# Patient Record
Sex: Female | Born: 1970
Health system: Southern US, Community
[De-identification: ages and names within clinical notes are randomized; demographics above are authoritative.]

## PROBLEM LIST (undated history)

## (undated) DIAGNOSIS — I10 Essential (primary) hypertension: Secondary | ICD-10-CM

## (undated) DIAGNOSIS — R87629 Unspecified abnormal cytological findings in specimens from vagina: Secondary | ICD-10-CM

## (undated) DIAGNOSIS — F32A Depression, unspecified: Secondary | ICD-10-CM

## (undated) DIAGNOSIS — M545 Low back pain, unspecified: Secondary | ICD-10-CM

## (undated) DIAGNOSIS — K760 Fatty (change of) liver, not elsewhere classified: Secondary | ICD-10-CM

## (undated) DIAGNOSIS — M1990 Unspecified osteoarthritis, unspecified site: Secondary | ICD-10-CM

## (undated) DIAGNOSIS — G4730 Sleep apnea, unspecified: Secondary | ICD-10-CM

## (undated) DIAGNOSIS — T7840XA Allergy, unspecified, initial encounter: Secondary | ICD-10-CM

## (undated) DIAGNOSIS — G4733 Obstructive sleep apnea (adult) (pediatric): Secondary | ICD-10-CM

## (undated) DIAGNOSIS — K219 Gastro-esophageal reflux disease without esophagitis: Secondary | ICD-10-CM

## (undated) DIAGNOSIS — R8789 Other abnormal findings in specimens from female genital organs: Secondary | ICD-10-CM

## (undated) DIAGNOSIS — R87810 Cervical high risk human papillomavirus (HPV) DNA test positive: Secondary | ICD-10-CM

## (undated) DIAGNOSIS — E049 Nontoxic goiter, unspecified: Secondary | ICD-10-CM

## (undated) DIAGNOSIS — E669 Obesity, unspecified: Secondary | ICD-10-CM

## (undated) DIAGNOSIS — F419 Anxiety disorder, unspecified: Secondary | ICD-10-CM

## (undated) DIAGNOSIS — F329 Major depressive disorder, single episode, unspecified: Secondary | ICD-10-CM

## (undated) HISTORY — DX: Depression, unspecified: F32.A

## (undated) HISTORY — PX: ENDOMETRIAL ABLATION: SHX621

## (undated) HISTORY — DX: Nontoxic goiter, unspecified: E04.9

## (undated) HISTORY — DX: Major depressive disorder, single episode, unspecified: F32.9

## (undated) HISTORY — DX: Essential (primary) hypertension: I10

## (undated) HISTORY — DX: Obesity, unspecified: E66.9

## (undated) HISTORY — DX: Sleep apnea, unspecified: G47.30

## (undated) HISTORY — DX: Allergy, unspecified, initial encounter: T78.40XA

## (undated) HISTORY — DX: Obstructive sleep apnea (adult) (pediatric): G47.33

## (undated) HISTORY — DX: Low back pain, unspecified: M54.50

## (undated) HISTORY — PX: TUBAL LIGATION: SHX77

## (undated) HISTORY — DX: Anxiety disorder, unspecified: F41.9

## (undated) HISTORY — DX: Unspecified abnormal cytological findings in specimens from vagina: R87.629

## (undated) HISTORY — PX: REDUCTION MAMMAPLASTY: SUR839

## (undated) HISTORY — DX: Cervical high risk human papillomavirus (HPV) DNA test positive: R87.810

## (undated) HISTORY — DX: Other abnormal findings in specimens from female genital organs: R87.89

## (undated) HISTORY — PX: ARTHROSCOPY KNEE W/ DRILLING: SUR92

## (undated) HISTORY — DX: Low back pain: M54.5

---
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
## 1998-02-11 ENCOUNTER — Encounter: Admission: RE | Admit: 1998-02-11 | Discharge: 1998-05-12 | Payer: Self-pay | Admitting: Family Medicine

## 1999-05-11 ENCOUNTER — Other Ambulatory Visit: Admission: RE | Admit: 1999-05-11 | Discharge: 1999-05-11 | Payer: Self-pay | Admitting: *Deleted

## 1999-07-16 ENCOUNTER — Encounter: Payer: Self-pay | Admitting: Family Medicine

## 1999-07-16 ENCOUNTER — Emergency Department (HOSPITAL_COMMUNITY): Admission: EM | Admit: 1999-07-16 | Discharge: 1999-07-16 | Payer: Self-pay | Admitting: Emergency Medicine

## 1999-07-16 ENCOUNTER — Ambulatory Visit (HOSPITAL_COMMUNITY): Admission: RE | Admit: 1999-07-16 | Discharge: 1999-07-16 | Payer: Self-pay | Admitting: Family Medicine

## 1999-07-20 ENCOUNTER — Ambulatory Visit (HOSPITAL_COMMUNITY): Admission: RE | Admit: 1999-07-20 | Discharge: 1999-07-20 | Payer: Self-pay | Admitting: *Deleted

## 2000-07-10 ENCOUNTER — Other Ambulatory Visit: Admission: RE | Admit: 2000-07-10 | Discharge: 2000-07-10 | Payer: Self-pay | Admitting: *Deleted

## 2001-07-17 ENCOUNTER — Other Ambulatory Visit: Admission: RE | Admit: 2001-07-17 | Discharge: 2001-07-17 | Payer: Self-pay | Admitting: *Deleted

## 2001-09-26 ENCOUNTER — Ambulatory Visit (HOSPITAL_COMMUNITY): Admission: RE | Admit: 2001-09-26 | Discharge: 2001-09-26 | Payer: Self-pay | Admitting: Internal Medicine

## 2001-09-26 ENCOUNTER — Encounter (INDEPENDENT_AMBULATORY_CARE_PROVIDER_SITE_OTHER): Payer: Self-pay | Admitting: Internal Medicine

## 2001-10-04 ENCOUNTER — Ambulatory Visit (HOSPITAL_COMMUNITY): Admission: RE | Admit: 2001-10-04 | Discharge: 2001-10-04 | Payer: Self-pay | Admitting: Internal Medicine

## 2001-12-27 ENCOUNTER — Ambulatory Visit (HOSPITAL_COMMUNITY): Admission: RE | Admit: 2001-12-27 | Discharge: 2001-12-27 | Payer: Self-pay | Admitting: Internal Medicine

## 2001-12-27 ENCOUNTER — Encounter (INDEPENDENT_AMBULATORY_CARE_PROVIDER_SITE_OTHER): Payer: Self-pay | Admitting: Internal Medicine

## 2002-09-11 ENCOUNTER — Other Ambulatory Visit: Admission: RE | Admit: 2002-09-11 | Discharge: 2002-09-11 | Payer: Self-pay | Admitting: *Deleted

## 2002-12-27 ENCOUNTER — Emergency Department (HOSPITAL_COMMUNITY): Admission: EM | Admit: 2002-12-27 | Discharge: 2002-12-27 | Payer: Self-pay | Admitting: Internal Medicine

## 2003-11-17 ENCOUNTER — Other Ambulatory Visit: Admission: RE | Admit: 2003-11-17 | Discharge: 2003-11-17 | Payer: Self-pay | Admitting: *Deleted

## 2004-05-27 ENCOUNTER — Emergency Department (HOSPITAL_COMMUNITY): Admission: EM | Admit: 2004-05-27 | Discharge: 2004-05-27 | Payer: Self-pay | Admitting: Family Medicine

## 2004-12-26 ENCOUNTER — Ambulatory Visit: Payer: Self-pay | Admitting: Internal Medicine

## 2005-02-17 ENCOUNTER — Ambulatory Visit: Payer: Self-pay | Admitting: Internal Medicine

## 2005-04-17 ENCOUNTER — Ambulatory Visit: Payer: Self-pay | Admitting: Internal Medicine

## 2005-07-07 ENCOUNTER — Ambulatory Visit: Payer: Self-pay | Admitting: Internal Medicine

## 2005-09-01 ENCOUNTER — Ambulatory Visit: Payer: Self-pay | Admitting: Internal Medicine

## 2005-11-15 ENCOUNTER — Emergency Department (HOSPITAL_COMMUNITY): Admission: EM | Admit: 2005-11-15 | Discharge: 2005-11-15 | Payer: Self-pay | Admitting: Emergency Medicine

## 2005-12-12 ENCOUNTER — Ambulatory Visit (HOSPITAL_COMMUNITY): Admission: RE | Admit: 2005-12-12 | Discharge: 2005-12-12 | Payer: Self-pay | Admitting: Internal Medicine

## 2005-12-12 ENCOUNTER — Ambulatory Visit: Payer: Self-pay | Admitting: Internal Medicine

## 2005-12-28 ENCOUNTER — Encounter: Payer: Self-pay | Admitting: Pulmonary Disease

## 2005-12-28 ENCOUNTER — Ambulatory Visit (HOSPITAL_BASED_OUTPATIENT_CLINIC_OR_DEPARTMENT_OTHER): Admission: RE | Admit: 2005-12-28 | Discharge: 2005-12-28 | Payer: Self-pay | Admitting: Internal Medicine

## 2006-01-03 ENCOUNTER — Ambulatory Visit: Payer: Self-pay | Admitting: Pulmonary Disease

## 2006-02-05 ENCOUNTER — Ambulatory Visit: Payer: Self-pay | Admitting: Pulmonary Disease

## 2007-01-09 ENCOUNTER — Ambulatory Visit: Payer: Self-pay | Admitting: Internal Medicine

## 2007-01-15 ENCOUNTER — Other Ambulatory Visit: Admission: RE | Admit: 2007-01-15 | Discharge: 2007-01-15 | Payer: Self-pay | Admitting: *Deleted

## 2007-03-12 ENCOUNTER — Ambulatory Visit: Payer: Self-pay | Admitting: Internal Medicine

## 2007-09-12 ENCOUNTER — Encounter: Payer: Self-pay | Admitting: Internal Medicine

## 2007-09-12 ENCOUNTER — Ambulatory Visit: Payer: Self-pay | Admitting: Internal Medicine

## 2007-09-12 DIAGNOSIS — I10 Essential (primary) hypertension: Secondary | ICD-10-CM | POA: Insufficient documentation

## 2007-09-12 DIAGNOSIS — J45909 Unspecified asthma, uncomplicated: Secondary | ICD-10-CM | POA: Insufficient documentation

## 2007-09-12 DIAGNOSIS — Z6841 Body Mass Index (BMI) 40.0 and over, adult: Secondary | ICD-10-CM

## 2007-09-12 DIAGNOSIS — L719 Rosacea, unspecified: Secondary | ICD-10-CM | POA: Insufficient documentation

## 2007-09-12 DIAGNOSIS — M545 Low back pain, unspecified: Secondary | ICD-10-CM | POA: Insufficient documentation

## 2007-09-12 DIAGNOSIS — F4321 Adjustment disorder with depressed mood: Secondary | ICD-10-CM | POA: Insufficient documentation

## 2007-09-12 LAB — CONVERTED CEMR LAB
ALT: 23 units/L (ref 0–35)
AST: 22 units/L (ref 0–37)
Albumin: 3.9 g/dL (ref 3.5–5.2)
Alkaline Phosphatase: 59 units/L (ref 39–117)
BUN: 11 mg/dL (ref 6–23)
Basophils Absolute: 0.3 10*3/uL — ABNORMAL HIGH (ref 0.0–0.1)
Basophils Relative: 5 % — ABNORMAL HIGH (ref 0.0–1.0)
Bilirubin, Direct: 0.1 mg/dL (ref 0.0–0.3)
CO2: 24 meq/L (ref 19–32)
Calcium: 9.3 mg/dL (ref 8.4–10.5)
Chloride: 107 meq/L (ref 96–112)
Cholesterol: 215 mg/dL (ref 0–200)
Creatinine, Ser: 0.7 mg/dL (ref 0.4–1.2)
Direct LDL: 145.5 mg/dL
Eosinophils Absolute: 0.1 10*3/uL (ref 0.0–0.6)
Eosinophils Relative: 1.5 % (ref 0.0–5.0)
GFR calc Af Amer: 123 mL/min
GFR calc non Af Amer: 102 mL/min
Glucose, Bld: 91 mg/dL (ref 70–99)
HCT: 41.3 % (ref 36.0–46.0)
HDL: 43.8 mg/dL (ref >39.0)
Hemoglobin: 14.5 g/dL (ref 12.0–15.0)
Lymphocytes Relative: 32.2 % (ref 12.0–46.0)
MCHC: 35.1 g/dL (ref 30.0–36.0)
MCV: 88.5 fL (ref 78.0–100.0)
Monocytes Absolute: 0.5 10*3/uL (ref 0.2–0.7)
Monocytes Relative: 6.8 % (ref 3.0–11.0)
Neutro Abs: 3.8 10*3/uL (ref 1.4–7.7)
Neutrophils Relative %: 54.5 % (ref 43.0–77.0)
Platelets: 239 10*3/uL (ref 150–400)
Potassium: 3.8 meq/L (ref 3.5–5.1)
RBC: 4.67 M/uL (ref 3.87–5.11)
RDW: 14 % (ref 11.5–14.6)
Sodium: 138 meq/L (ref 135–145)
TSH: 0.47 microintl units/mL (ref 0.35–5.50)
Total Bilirubin: 0.6 mg/dL (ref 0.3–1.2)
Total CHOL/HDL Ratio: 4.9
Total Protein: 7.5 g/dL (ref 6.0–8.3)
Triglycerides: 174 mg/dL — ABNORMAL HIGH (ref 0–149)
VLDL: 35 mg/dL (ref 0–40)
WBC: 7 10*3/uL (ref 4.5–10.5)

## 2007-09-16 ENCOUNTER — Telehealth: Payer: Self-pay | Admitting: Internal Medicine

## 2007-12-06 ENCOUNTER — Telehealth (INDEPENDENT_AMBULATORY_CARE_PROVIDER_SITE_OTHER): Payer: Self-pay | Admitting: *Deleted

## 2008-04-16 DIAGNOSIS — J309 Allergic rhinitis, unspecified: Secondary | ICD-10-CM | POA: Insufficient documentation

## 2008-04-16 DIAGNOSIS — G2581 Restless legs syndrome: Secondary | ICD-10-CM | POA: Insufficient documentation

## 2008-04-16 DIAGNOSIS — Z9189 Other specified personal risk factors, not elsewhere classified: Secondary | ICD-10-CM | POA: Insufficient documentation

## 2008-05-06 ENCOUNTER — Telehealth: Payer: Self-pay | Admitting: Internal Medicine

## 2008-06-04 ENCOUNTER — Telehealth: Payer: Self-pay | Admitting: Internal Medicine

## 2008-07-30 ENCOUNTER — Ambulatory Visit: Payer: Self-pay | Admitting: Internal Medicine

## 2008-07-30 ENCOUNTER — Ambulatory Visit: Payer: Self-pay | Admitting: Cardiology

## 2008-07-30 DIAGNOSIS — R109 Unspecified abdominal pain: Secondary | ICD-10-CM | POA: Insufficient documentation

## 2008-07-30 DIAGNOSIS — R319 Hematuria, unspecified: Secondary | ICD-10-CM | POA: Insufficient documentation

## 2008-07-30 LAB — CONVERTED CEMR LAB
BUN: 7 mg/dL (ref 6–23)
Basophils Absolute: 0.1 10*3/uL (ref 0.0–0.1)
Basophils Relative: 0.8 % (ref 0.0–3.0)
Bilirubin Urine: NEGATIVE
CO2: 26 meq/L (ref 19–32)
Chloride: 106 meq/L (ref 96–112)
Creatinine, Ser: 0.9 mg/dL (ref 0.4–1.2)
Eosinophils Relative: 0.3 % (ref 0.0–5.0)
GFR calc non Af Amer: 76 mL/min
Glucose, Bld: 98 mg/dL (ref 70–99)
Glucose, Urine, Semiquant: NEGATIVE
Hemoglobin: 13.4 g/dL (ref 12.0–15.0)
Ketones, ur: NEGATIVE mg/dL
Ketones, urine, test strip: NEGATIVE
Lymphocytes Relative: 16.4 % (ref 12.0–46.0)
MCHC: 35.2 g/dL (ref 30.0–36.0)
MCV: 89.8 fL (ref 78.0–100.0)
Neutro Abs: 7.5 10*3/uL (ref 1.4–7.7)
Nitrite: NEGATIVE
Potassium: 4.3 meq/L (ref 3.5–5.1)
Protein, U semiquant: >=300
RBC: 4.24 M/uL (ref 3.87–5.11)
Specific Gravity, Urine: >=1.03
Specific Gravity, Urine: >=1.03 (ref 1.000–1.03)
Total Protein, Urine: >=300 mg/dL — AB
Urine Glucose: NEGATIVE mg/dL
Urobilinogen, UA: 0.2
WBC: 9.7 10*3/uL (ref 4.5–10.5)
pH: 6
pH: 5.5 (ref 5.0–8.0)

## 2008-07-31 ENCOUNTER — Encounter: Payer: Self-pay | Admitting: Internal Medicine

## 2008-08-10 ENCOUNTER — Telehealth: Payer: Self-pay | Admitting: Internal Medicine

## 2008-09-14 ENCOUNTER — Telehealth (INDEPENDENT_AMBULATORY_CARE_PROVIDER_SITE_OTHER): Payer: Self-pay | Admitting: *Deleted

## 2008-09-18 ENCOUNTER — Telehealth (INDEPENDENT_AMBULATORY_CARE_PROVIDER_SITE_OTHER): Payer: Self-pay | Admitting: *Deleted

## 2008-09-21 ENCOUNTER — Telehealth: Payer: Self-pay | Admitting: Internal Medicine

## 2008-09-23 ENCOUNTER — Ambulatory Visit: Payer: Self-pay | Admitting: Internal Medicine

## 2008-09-23 LAB — CONVERTED CEMR LAB: Vit D, 1,25-Dihydroxy: 45 (ref 30–89)

## 2008-09-24 LAB — CONVERTED CEMR LAB
AST: 28 units/L (ref 0–37)
Alkaline Phosphatase: 47 units/L (ref 39–117)
Bilirubin Urine: NEGATIVE
Bilirubin, Direct: 0.1 mg/dL (ref 0.0–0.3)
Chloride: 106 meq/L (ref 96–112)
Eosinophils Absolute: 0.1 10*3/uL (ref 0.0–0.7)
Eosinophils Relative: 2 % (ref 0.0–5.0)
GFR calc Af Amer: 122 mL/min
GFR calc non Af Amer: 101 mL/min
HCT: 40.2 % (ref 36.0–46.0)
HDL: 39.1 mg/dL (ref >39.0)
Hemoglobin, Urine: NEGATIVE
LDL Cholesterol: 106 mg/dL — ABNORMAL HIGH (ref 0–99)
MCV: 90.4 fL (ref 78.0–100.0)
Monocytes Absolute: 0.4 10*3/uL (ref 0.1–1.0)
Monocytes Relative: 8.5 % (ref 3.0–12.0)
Neutrophils Relative %: 49.9 % (ref 43.0–77.0)
Nitrite: NEGATIVE
Platelets: 214 10*3/uL (ref 150–400)
Potassium: 4 meq/L (ref 3.5–5.1)
RDW: 12.9 % (ref 11.5–14.6)
Sodium: 138 meq/L (ref 135–145)
TSH: 0.53 microintl units/mL (ref 0.35–5.50)
Total Protein, Urine: NEGATIVE mg/dL
Urobilinogen, UA: 0.2 (ref 0.0–1.0)
VLDL: 20 mg/dL (ref 0–40)
WBC: 4.9 10*3/uL (ref 4.5–10.5)

## 2008-09-30 ENCOUNTER — Ambulatory Visit: Payer: Self-pay | Admitting: Internal Medicine

## 2008-09-30 DIAGNOSIS — R030 Elevated blood-pressure reading, without diagnosis of hypertension: Secondary | ICD-10-CM | POA: Insufficient documentation

## 2008-10-14 ENCOUNTER — Telehealth: Payer: Self-pay | Admitting: Internal Medicine

## 2008-10-16 ENCOUNTER — Telehealth: Payer: Self-pay | Admitting: Internal Medicine

## 2008-12-23 ENCOUNTER — Telehealth: Payer: Self-pay | Admitting: Internal Medicine

## 2009-01-11 ENCOUNTER — Ambulatory Visit: Payer: Self-pay | Admitting: Internal Medicine

## 2009-01-11 DIAGNOSIS — R21 Rash and other nonspecific skin eruption: Secondary | ICD-10-CM | POA: Insufficient documentation

## 2009-04-19 ENCOUNTER — Telehealth: Payer: Self-pay | Admitting: Internal Medicine

## 2009-05-25 ENCOUNTER — Telehealth: Payer: Self-pay | Admitting: Internal Medicine

## 2009-06-09 ENCOUNTER — Telehealth: Payer: Self-pay | Admitting: Internal Medicine

## 2009-07-28 ENCOUNTER — Encounter: Payer: Self-pay | Admitting: Internal Medicine

## 2009-11-03 ENCOUNTER — Emergency Department (HOSPITAL_COMMUNITY): Admission: EM | Admit: 2009-11-03 | Discharge: 2009-11-03 | Payer: Self-pay | Admitting: Emergency Medicine

## 2009-12-24 ENCOUNTER — Telehealth: Payer: Self-pay | Admitting: Internal Medicine

## 2010-01-25 ENCOUNTER — Telehealth: Payer: Self-pay | Admitting: Internal Medicine

## 2010-04-14 ENCOUNTER — Ambulatory Visit: Payer: Self-pay | Admitting: Internal Medicine

## 2010-04-14 DIAGNOSIS — R609 Edema, unspecified: Secondary | ICD-10-CM | POA: Insufficient documentation

## 2010-04-14 DIAGNOSIS — R209 Unspecified disturbances of skin sensation: Secondary | ICD-10-CM | POA: Insufficient documentation

## 2010-04-14 LAB — CONVERTED CEMR LAB: Vitamin B-12: 415 pg/mL (ref 211–911)

## 2010-04-27 ENCOUNTER — Telehealth: Payer: Self-pay | Admitting: Internal Medicine

## 2010-05-17 ENCOUNTER — Telehealth: Payer: Self-pay | Admitting: Internal Medicine

## 2010-07-04 ENCOUNTER — Telehealth: Payer: Self-pay | Admitting: Internal Medicine

## 2010-07-27 ENCOUNTER — Telehealth: Payer: Self-pay | Admitting: Internal Medicine

## 2010-08-01 ENCOUNTER — Ambulatory Visit: Payer: Self-pay | Admitting: Internal Medicine

## 2010-08-01 DIAGNOSIS — R0609 Other forms of dyspnea: Secondary | ICD-10-CM | POA: Insufficient documentation

## 2010-08-01 DIAGNOSIS — R5382 Chronic fatigue, unspecified: Secondary | ICD-10-CM | POA: Insufficient documentation

## 2010-08-01 DIAGNOSIS — R5383 Other fatigue: Secondary | ICD-10-CM

## 2010-08-01 DIAGNOSIS — R0989 Other specified symptoms and signs involving the circulatory and respiratory systems: Secondary | ICD-10-CM | POA: Insufficient documentation

## 2010-08-01 DIAGNOSIS — R404 Transient alteration of awareness: Secondary | ICD-10-CM

## 2010-08-01 DIAGNOSIS — R5381 Other malaise: Secondary | ICD-10-CM | POA: Insufficient documentation

## 2010-08-02 LAB — CONVERTED CEMR LAB
ALT: 17 units/L (ref 0–35)
Alkaline Phosphatase: 49 units/L (ref 39–117)
Basophils Relative: 0.6 % (ref 0.0–3.0)
Bilirubin Urine: NEGATIVE
Bilirubin, Direct: 0.1 mg/dL (ref 0.0–0.3)
Calcium: 9.2 mg/dL (ref 8.4–10.5)
Creatinine, Ser: 0.8 mg/dL (ref 0.4–1.2)
Eosinophils Relative: 2 % (ref 0.0–5.0)
Lymphocytes Relative: 34.9 % (ref 12.0–46.0)
Neutrophils Relative %: 55 % (ref 43.0–77.0)
Nitrite: NEGATIVE
RBC: 4.43 M/uL (ref 3.87–5.11)
Total Protein, Urine: NEGATIVE mg/dL
Total Protein: 6.5 g/dL (ref 6.0–8.3)
Vitamin B-12: 445 pg/mL (ref 211–911)
WBC: 7.7 10*3/uL (ref 4.5–10.5)
pH: 5.5 (ref 5.0–8.0)

## 2010-08-15 ENCOUNTER — Encounter: Payer: Self-pay | Admitting: Internal Medicine

## 2010-09-15 ENCOUNTER — Telehealth: Payer: Self-pay | Admitting: Internal Medicine

## 2010-09-15 ENCOUNTER — Ambulatory Visit: Payer: Self-pay | Admitting: Internal Medicine

## 2010-09-15 LAB — CONVERTED CEMR LAB
Free T4: 0.83 ng/dL (ref 0.60–1.60)
T3, Free: 2.8 pg/mL (ref 2.3–4.2)

## 2010-09-21 ENCOUNTER — Telehealth: Payer: Self-pay | Admitting: Internal Medicine

## 2010-09-26 ENCOUNTER — Telehealth: Payer: Self-pay | Admitting: Internal Medicine

## 2010-10-11 ENCOUNTER — Telehealth: Payer: Self-pay | Admitting: Internal Medicine

## 2010-10-14 ENCOUNTER — Telehealth: Payer: Self-pay | Admitting: Internal Medicine

## 2010-10-18 ENCOUNTER — Encounter (INDEPENDENT_AMBULATORY_CARE_PROVIDER_SITE_OTHER): Payer: Self-pay | Admitting: *Deleted

## 2010-11-15 ENCOUNTER — Encounter: Payer: Self-pay | Admitting: Internal Medicine

## 2010-11-18 ENCOUNTER — Ambulatory Visit
Admission: RE | Admit: 2010-11-18 | Discharge: 2010-11-18 | Payer: Self-pay | Source: Home / Self Care | Attending: Pulmonary Disease | Admitting: Pulmonary Disease

## 2010-11-22 ENCOUNTER — Telehealth (INDEPENDENT_AMBULATORY_CARE_PROVIDER_SITE_OTHER): Payer: Self-pay | Admitting: *Deleted

## 2010-11-22 ENCOUNTER — Encounter: Payer: Self-pay | Admitting: Pulmonary Disease

## 2010-11-23 ENCOUNTER — Ambulatory Visit (HOSPITAL_BASED_OUTPATIENT_CLINIC_OR_DEPARTMENT_OTHER)
Admission: RE | Admit: 2010-11-23 | Discharge: 2010-11-23 | Payer: Self-pay | Source: Home / Self Care | Attending: Pulmonary Disease | Admitting: Pulmonary Disease

## 2010-11-23 ENCOUNTER — Encounter: Payer: Self-pay | Admitting: Pulmonary Disease

## 2010-11-24 ENCOUNTER — Encounter: Payer: Self-pay | Admitting: Pulmonary Disease

## 2010-12-11 LAB — CONVERTED CEMR LAB
Albumin: 3.7 g/dL (ref 3.5–5.2)
Alkaline Phosphatase: 49 units/L (ref 39–117)
BUN: 11 mg/dL (ref 6–23)
Basophils Absolute: 0 10*3/uL (ref 0.0–0.1)
CO2: 29 meq/L (ref 19–32)
Calcium: 9 mg/dL (ref 8.4–10.5)
Cholesterol: 190 mg/dL (ref 0–200)
Creatinine, Ser: 0.6 mg/dL (ref 0.4–1.2)
Eosinophils Absolute: 0.1 10*3/uL (ref 0.0–0.7)
Glucose, Bld: 97 mg/dL (ref 70–99)
HDL: 51.2 mg/dL (ref >39.00)
Hemoglobin: 13.9 g/dL (ref 12.0–15.0)
Leukocytes, UA: NEGATIVE
Lymphocytes Relative: 35 % (ref 12.0–46.0)
MCHC: 35.2 g/dL (ref 30.0–36.0)
Neutro Abs: 3.8 10*3/uL (ref 1.4–7.7)
Platelets: 228 10*3/uL (ref 150.0–400.0)
RDW: 13.1 % (ref 11.5–14.6)
Sodium: 141 meq/L (ref 135–145)
Specific Gravity, Urine: 1.02 (ref 1.000–1.030)
TSH: 0.95 microintl units/mL (ref 0.35–5.50)
Total Bilirubin: 0.6 mg/dL (ref 0.3–1.2)
Triglycerides: 124 mg/dL (ref 0.0–149.0)
Urine Glucose: NEGATIVE mg/dL
Urobilinogen, UA: 0.2 (ref 0.0–1.0)
VLDL: 24.8 mg/dL (ref 0.0–40.0)

## 2010-12-14 ENCOUNTER — Encounter: Payer: Self-pay | Admitting: Pulmonary Disease

## 2010-12-15 ENCOUNTER — Ambulatory Visit: Admit: 2010-12-15 | Payer: Self-pay | Admitting: Pulmonary Disease

## 2010-12-15 ENCOUNTER — Encounter: Payer: Self-pay | Admitting: Pulmonary Disease

## 2010-12-15 ENCOUNTER — Ambulatory Visit (INDEPENDENT_AMBULATORY_CARE_PROVIDER_SITE_OTHER): Payer: 59 | Admitting: Pulmonary Disease

## 2010-12-15 DIAGNOSIS — G4733 Obstructive sleep apnea (adult) (pediatric): Secondary | ICD-10-CM

## 2010-12-15 NOTE — Progress Notes (Signed)
Summary: Fatigue  Phone Note Call from Patient   Summary of Call: Pt c/o increasing fatigue especially in the evening. She said b12 has not changed symptoms. She takes cymbalta in pm and clonazepam in the am. Please advise, pt wants to know if there is anything else she can do?  Initial call taken by: Lamar Sprinkles, CMA,  July 27, 2010 2:37 PM  Follow-up for Phone Call        I'm not sure. OV pls Follow-up by: Tresa Garter MD,  July 27, 2010 4:59 PM  Additional Follow-up for Phone Call Additional follow up Details #1::        Scheduled for monday Additional Follow-up by: Lamar Sprinkles, CMA,  July 27, 2010 5:23 PM
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
## 2010-12-15 NOTE — Progress Notes (Signed)
Summary: [facility]   Phone Note From Pharmacy   Caller: [facility] [phone] REF [phone] Summary of Call: [facility] is req a call back regarding Furosemide.  Initial call taken by: Lamar Sprinkles, CMA,  April 27, 2010 5:33 PM  Follow-up for Phone Call        Furosemide is not 90 day supply. Re-sent prescription. Follow-up by: Lucious Groves,  April 28, 2010 2:19 PM    Prescriptions: FUROSEMIDE 40 MG TABS (FUROSEMIDE) 1 by mouth q am ( a water pill)  #90 x 3   Entered by:   Lucious Groves   Authorized by:   Tresa Garter MD   Signed by:   Lucious Groves on 04/28/2010   Method used:   Faxed to ...       [facility] (mail-order)             , [place]         Ph: [phone]       Fax: [phone]   RxID:   [id number]

## 2010-12-15 NOTE — Progress Notes (Signed)
Summary: topiramate  Phone Note Other Incoming Call back at fax   Caller: [facility] Summary of Call: refill on topiramate  ok x 90  /vg Initial call taken by: Tora Perches,  December 24, 2009 2:55 PM    Prescriptions: TOPAMAX 25 MG  TABS (TOPIRAMATE) 3 at hs  #90 x 0   Entered by:   Tora Perches   Authorized by:   Tresa Garter MD   Signed by:   Tora Perches on 12/24/2009   Method used:   Electronically to        [facility] (retail)       [address]       [place], [place]  [address]       Ph: [phone]       Fax: [phone]   RxID:   [id number]

## 2010-12-15 NOTE — Progress Notes (Signed)
  Phone Note Call from Patient Call back at [facility] Phone [phone]   Caller: Patient Reason for Call: Privacy/Consent Authorization Summary of Call: rec fax from pt stating she if she takes trazodone before 9pm she sleeps fairly well(still waking off/on)......if she takes it later it takes longer to go to sleep. 1.))She wants to know if you think it should be increased? 2.)) She has appt with Arbutus Ped, LCSW on thur.  Is a Administrator, Civil Service ok instead of psychologist???? 3.)) Pt is requesting 1 mo supply of Cymbalta 60mg   samples. Initial call taken by: Lanier Prude, [facility]),  October 11, 2010 9:11 AM  Follow-up for Phone Call        OK Cymbalta OK Trazodone 2 at hs to try Keep appt w/Mr Onalee Hua and see Dr Evelene Croon Follow-up by: Tresa Garter MD,  October 11, 2010 1:13 PM  Additional Follow-up for Phone Call Additional follow up Details #1::        Pt informed, samples ready  Additional Follow-up by: Lamar Sprinkles, CMA,  October 12, 2010 6:08 PM    New/Updated Medications: TRAZODONE HCL 50 MG TABS (TRAZODONE HCL) 2 by mouth qhs

## 2010-12-15 NOTE — Progress Notes (Signed)
Summary: Psychiatrist  Phone Note Call from Patient Call back at Home Phone [phone]   Caller: Patient Summary of Call: rec fax from pt stating Dr. Betti Cruz is out of network.  The following 3 psychiatrists are in network: Dr. Len Blalock  [phone] Dr. Tiajuana Amass [phone] Dr. Milagros Evener  [phone] Pt is asking if you would recommend any of these? Initial call taken by: Lanier Prude, [facility]),  September 26, 2010 8:25 AM  Follow-up for Phone Call        Dr Evelene Croon Thank you!  Follow-up by: Tresa Garter MD,  September 26, 2010 8:57 PM  Additional Follow-up for Phone Call Additional follow up Details #1::        pt informed. She will contact them to arrange appt. Additional Follow-up by: Lanier Prude, [facility]),  September 27, 2010 8:19 AM

## 2010-12-15 NOTE — Progress Notes (Signed)
Summary: FYI  Phone Note Call from Patient   Summary of Call: rec a fax from pt stating she is ready to sched sleep study.  In this fax she states her anxiety has become worse and she is taking clonazepam 30 min before going to bed but still not sleeping.  She states she has also been having increased depression over the last month.  It does not matter what time of the day she takes her Cymbalta.  She states she does not want to see her family for the holidays and feels miserable and angry most of the time.  She states she is not suicidal but is requesting a call back from MD at [phone]. Initial call taken by: Lanier Prude, [facility]),  September 21, 2010 11:45 AM  Follow-up for Phone Call        I called pt today. Celexa, paxil, celexa did not help. Discussed her stress. Cymbalta is helping. Start Trazodone at hs. Psych consult  Not suicidal  Follow-up by: Tresa Garter MD,  September 23, 2010 1:16 PM    New/Updated Medications: TRAZODONE HCL 50 MG TABS (TRAZODONE HCL) 1 by mouth qhs Prescriptions: TRAZODONE HCL 50 MG TABS (TRAZODONE HCL) 1 by mouth qhs  #30 x 2   Entered and Authorized by:   Tresa Garter MD   Signed by:   Tresa Garter MD on 09/23/2010   Method used:   [facility]        [facility]  [address] [phone][facility] (retail)       [address]       [place], [place]  [id number]       Ph: [phone] or [phone]       Fax: [phone]   RxID:   [id number]

## 2010-12-15 NOTE — Medication Information (Signed)
Summary: Trazodone / [facility]  Trazodone / [facility]   Imported By: Lennie Odor 11/21/2010 13:51:12  _____________________________________________________________________  External Attachment:    Type:   Image     Comment:   External Document

## 2010-12-15 NOTE — Medication Information (Signed)
Summary: Clonazepam / [facility]  Clonazepam / [facility]   Imported By: Lennie Odor 11/21/2010 13:49:53  _____________________________________________________________________  External Attachment:    Type:   Image     Comment:   External Document

## 2010-12-15 NOTE — Progress Notes (Signed)
Summary: wanted to check on CPAP order  Phone Note Call from Patient   Caller: Patient Call For: Dr. Vassie Loll Summary of Call: Patient phoned and has not received the CPAP yet and wanted to check on the order. Patient can be reached at [phone] Initial call taken by: Vedia Coffer,  November 22, 2010 1:17 PM  Follow-up for Phone Call        Called and spoke with Vickey Huger at [facility].  She states that the order was recieved.  I advised that they need to contact the pt asap, as she is concerned about getting started on CPAP.  Spoke with pt and notified of this.  Pt verbalized understanding. Follow-up by: Vernie Murders,  November 22, 2010 1:23 PM

## 2010-12-15 NOTE — Progress Notes (Signed)
Summary: RX correction  Phone Note Call from Patient Call back at Home Phone [phone]   Summary of Call: Patient left message on triage that her Clonazepam prescription still says that she needs an appt. Patient would like that removed before her next refill. Initial call taken by: Lucious Groves CMA,  July 04, 2010 11:42 AM    New/Updated Medications: CLONAZEPAM 1 MG TABS (CLONAZEPAM) two times a day

## 2010-12-15 NOTE — Assessment & Plan Note (Signed)
Summary: discuss fatigue/SD   Vital Signs:  Patient profile:   40 year old female Weight:      303 pounds Temp:     98.4 degrees F oral Pulse rate:   88 / minute Pulse rhythm:   regular Resp:     16 per minute BP sitting:   120 / 88  (left arm) Cuff size:   large  Vitals Entered By: Lanier Prude, Beverly Gust) (August 01, 2010 2:44 PM) CC: increased fatigue Is Patient Diabetic? No Comments pt is not taking HCTZ   CC:  increased fatigue.  History of Present Illness: C/o somnolence and tiredness x 1 months  C/o snoring, possible OSA F/u depression, HAs; taking Cymbalta at hs.  Preventive Screening-Counseling & Management  Alcohol-Tobacco     Smoking Status: quit     Passive Smoke Exposure: no  Current Medications (verified): 1)  Topamax 25 Mg  Tabs (Topiramate) .... 3 At [facility] 2)  Cyclobenzaprine Hcl 10 Mg  Tabs (Cyclobenzaprine Hcl) .[name].. 1 Two Times A Day Prn 3)  Clonazepam 1 Mg Tabs (Clonazepam) .... Two Times A Day 4)  Folic Acid 1 Mg Tabs (Folic Acid) .[name].. 1 Qd 5)  Proair Hfa 108 (90 Base) Mcg/act  Aers (Albuterol Sulfate) .... 2 Inh Q4h As Needed Shortness of Breath 6)  Hydrochlorothiazide 12.5 Mg  Tabs (Hydrochlorothiazide) .... Take 1 Tab  By Mouth Every Morning 7)  Metronidazole 0.75 % Gel (Metronidazole) .... Use Qhs 8)  Vitamin D3 1000 Unit  Tabs (Cholecalciferol) .[name].. 1 Qd 9)  Cymbalta 60 Mg Cpep (Duloxetine Hcl) .... Take One Capsule Daily 10)  Furosemide 40 Mg Tabs (Furosemide) .[name].. 1 By Mouth Q Am ( A Water Pill) 11)  Tramadol Hcl 50 Mg Tabs (Tramadol Hcl) .[name].. 1-2 Tabs By Mouth Two Times A Day As Needed Pain  Allergies (verified): 1)  ! 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  The patient denies fever, weight loss, weight gain, chest pain, abdominal pain, severe indigestion/heartburn, and depression.         L foot pain  Physical Exam  General:  overweight-appearing.  NAD Head:  Normocephalic and atraumatic without obvious abnormalities. No apparent alopecia or balding. Nose:  External nasal examination shows no deformity or inflammation. Nasal mucosa are pink and moist without lesions or exudates. Mouth:  Oral mucosa and a small oropharynx without lesions or exudates.  Teeth in good repair. Neck:  No deformities, masses, or tenderness noted. Lungs:  Normal respiratory effort, chest expands symmetrically. Lungs are clear to auscultation, no crackles or wheezes. Heart:  Normal rate and regular rhythm. S1 and S2 normal without gallop, murmur, click, rub or other extra sounds. Abdomen:  Bowel sounds positive,abdomen soft and non-tender without masses, organomegaly or hernias noted. Obese Msk:  Lumbar-sacral spine is tender to palpation over paraspinal muscles and painfull with the ROM  Extremities:  trace left pedal edema and trace right pedal edema.   Neurologic:  No  cranial nerve deficits noted. Station and gait are normal. Plantar reflexes are down-going bilaterally. DTRs are symmetrical throughout. Sensory, motor and coordinative functions appear intact. Skin:  Intact without suspicious lesions or rashes Psych:  Cognition and judgment appear intact. Alert and cooperative with normal attention span and concentration. No apparent delusions, illusions, hallucinationsnot suicidal and not homicidal.     Impression & Recommendations:  Problem # 1:  FATIGUE (ICD-780.79) - r/o OSA Assessment Deteriorated  Orders: Sleep  Disorder Referral (Sleep Disorder) TLB-B12, Serum-Total ONLY (82956-O13) TLB-BMP (Basic Metabolic Panel-BMET) (80048-METABOL) TLB-CBC Platelet - w/Differential (85025-CBCD) TLB-Hepatic/Liver Function Pnl (80076-HEPATIC) TLB-TSH (Thyroid Stimulating Hormone) (84443-TSH) TLB-Sedimentation Rate (ESR) (85652-ESR) TLB-Udip ONLY (81003-UDIP)  Problem # 2:  SNORING (ICD-786.09) Assessment: Deteriorated  Her updated medication list for this problem includes:    Proair Hfa 108 (90 Base) Mcg/act Aers (Albuterol sulfate) .[name]... 2 inh q4h as needed shortness of breath    Hydrochlorothiazide 12.5 Mg Tabs (Hydrochlorothiazide) .[name]... Take 1 tab  by mouth every morning    Furosemide 40 Mg Tabs (Furosemide) .[name]... 1 by mouth q am ( a water pill)  Orders: Sleep Disorder Referral (Sleep Disorder)  Problem # 3:  SOMNOLENCE (ICD-780.09) Assessment: New Trial of Nuvigyl. Do not drive if sleepy! Orders: Sleep Disorder Referral (Sleep Disorder) TLB-B12, Serum-Total ONLY (08657-Q46) TLB-BMP (Basic Metabolic Panel-BMET) (80048-METABOL) TLB-CBC Platelet - w/Differential (85025-CBCD) TLB-Hepatic/Liver Function Pnl (80076-HEPATIC) TLB-TSH (Thyroid Stimulating Hormone) (84443-TSH) TLB-Sedimentation Rate (ESR) (85652-ESR) TLB-Udip ONLY (81003-UDIP)  Problem # 4:  DEPRESSION (ICD-311) Assessment: Improved  Her updated medication list for this problem includes:    Clonazepam 1 Mg Tabs (Clonazepam) .[name]..[name] Two times a day    Cymbalta 60 Mg Cpep (Duloxetine hcl) .[name]... Take one capsule daily  Complete Medication List: 1)  Topamax 25 Mg Tabs (Topiramate) .... 3 at hs 2)  Cyclobenzaprine Hcl 10 Mg Tabs (Cyclobenzaprine hcl) .[name].. 1 two times a day prn 3)  Clonazepam 1 Mg Tabs (Clonazepam) .... Two times a day 4)  Folic Acid 1 Mg Tabs (Folic acid) .[name].. 1 qd 5)  Proair Hfa 108 (90 Base) Mcg/act Aers (Albuterol sulfate) .... 2 inh q4h as needed shortness of breath 6)  Hydrochlorothiazide 12.5 Mg Tabs  (Hydrochlorothiazide) .... Take 1 tab  by mouth every morning 7)  Metronidazole 0.75 % Gel (Metronidazole) .... Use qhs 8)  Vitamin D3 1000 Unit Tabs (Cholecalciferol) .[name].. 1 qd 9)  Cymbalta 60 Mg Cpep (Duloxetine hcl) .... Take one capsule daily 10)  Furosemide 40 Mg Tabs (Furosemide) .[name].. 1 by mouth q am ( a water pill) 11)  Tramadol Hcl 50 Mg Tabs (Tramadol hcl) .[name].. 1-2 tabs by mouth two times a day as needed pain 12)  Nuvigil 150 Mg Tabs (Armodafinil) .[name].. 1 by mouth qam for being more alert  Patient Instructions: 1)  Please schedule a follow-up appointment in 2 months. 2)  Call if you are not better in a reasonable amount of time or if worse.  Prescriptions: NUVIGIL 150 MG TABS (ARMODAFINIL) 1 by mouth qam for being more alert  #30 x 6   Entered and Authorized by:   Tresa Garter MD   Signed by:   Tresa Garter MD on 08/01/2010   Method used:   Print then Give to Patient   RxID:   [id number]

## 2010-12-15 NOTE — Letter (Signed)
Summary: [facility] Consult Scheduled Letter  [facility]  [address] [place], [place] [address]   Phone: [phone]  Fax: [phone]      10/18/2010 MRN: [id number]  [facility] [address] [place], [place]  [address]    Dear Ms. VOSS,      We have scheduled an appointment for you. At the recommendation of Dr.Plotnikov, we have scheduled you a consult with [facility]) Dr.Alva on January 6,2012 at 2:15pm.Their phone number is [phone].If this appointment day and time is not convenient for you, please feel free to call the office of the doctor you are being referred to at the number listed above and reschedule the appointment.  please give a 24/hr notice if you need to cancel/Reschedule to avoid a $50.00 Fee. Also bring insurance card and any co-pay due at time of visit  [facility] [address] [place], [place] [address]    Thank you,  Patient Care Coordinator [facility]

## 2010-12-15 NOTE — Progress Notes (Signed)
Summary: refill request  Phone Note Refill Request Message from:  Fax from Pharmacy on January 25, 2010 4:37 PM  Refills Requested: Medication #1:  CLONAZEPAM 1 MG TABS two times a day Initial call taken by: Lucious Groves,  January 25, 2010 4:37 PM  Follow-up for Phone Call        rx faxed to pharmacy Follow-up by: Margaret Pyle, CMA,  January 26, 2010 8:18 AM    New/Updated Medications: CLONAZEPAM 1 MG TABS (CLONAZEPAM) two times a day  -  pt last seen march 2010;  please make Return Office visit for further refills Prescriptions: CLONAZEPAM 1 MG TABS (CLONAZEPAM) two times a day  -  pt last seen march 2010;  please make Return Office visit for further refills  #60 x 0   Entered and Authorized by:   Corwin Levins MD   Signed by:   Corwin Levins MD on 01/25/2010   Method used:   Print then Give to Patient   RxID:   [id number]  done hardcopy to LIM side B - dahlia  Corwin Levins MD  January 25, 2010 8:07 PM

## 2010-12-15 NOTE — Progress Notes (Signed)
Summary: RF & REFERRAL  Phone Note Refill Request Message from:  Patient on October 14, 2010 10:15 AM  Refills Requested: Medication #1:  TRAZODONE HCL 50 MG TABS 2 by mouth qhs. pt [facility] [address]. She also wants to be advised on if should should still move forward with a sleep study and would need a referral for that seeing that her last ref has expired. Please Advise  Initial call taken by: Ami Bullins CMA,  October 14, 2010 10:16 AM  Follow-up for Phone Call        ok to renew ref - go ahead w/sleep study Thank you!  Follow-up by: Tresa Garter MD,  October 14, 2010 1:13 PM  Additional Follow-up for Phone Call Additional follow up Details #1::        Please put in new referral for sleep study, previous expired Additional Follow-up by: Lamar Sprinkles, CMA,  October 14, 2010 5:43 PM    Additional Follow-up for Phone Call Additional follow up Details #2::    ok Follow-up by: Tresa Garter MD,  October 17, 2010 1:17 PM  Prescriptions: TRAZODONE HCL 50 MG TABS (TRAZODONE HCL) 2 by mouth qhs  #60 x 5   Entered by:   Lamar Sprinkles, CMA   Authorized by:   Jacques Navy MD   Signed by:   Lamar Sprinkles, CMA on 10/14/2010   Method used:   [facility]  [address] [phone][facility] (retail)       [address]       [place], [place]  [id number]       Ph: [phone] or [phone]       Fax: [phone]   RxID:   [id number]

## 2010-12-15 NOTE — Progress Notes (Signed)
Summary: Call request  Phone Note Call from Patient   Caller: Physician Line [phone] [facility] Summary of Call: Patient needs [place] to call [facility] and give verbal ok for Tramadol, cyclobenzaprine and furosemide. ALSO there is an interaction between cymbalta and cyclobenaprine.   Ok to fill?  Ok for interaction?  Initial call taken by: Lamar Sprinkles, CMA,  April 27, 2010 9:00 AM  Follow-up for Phone Call        Pok to fill Take Cyclobenzaprine rarely and not w/Cymbalta Follow-up by: Tresa Garter MD,  April 27, 2010 1:12 PM  Additional Follow-up for Phone Call Additional follow up Details #1::        Called [facility] to notify and these meds have already began processing and will be shipped to the patient. Patient notified. Additional Follow-up by: Lucious Groves,  April 27, 2010 1:45 PM

## 2010-12-15 NOTE — Progress Notes (Signed)
Summary: PREDNISONE  Phone Note Call from Patient Call back at Home Phone [phone]   Summary of Call: Patient is requesting rx for prednisone dose pak for posion oak to [facility] to [facility] [address] Initial call taken by: Lamar Sprinkles, CMA,  May 17, 2010 9:40 AM  Follow-up for Phone Call        ok  OV if bad Follow-up by: Tresa Garter MD,  May 17, 2010 1:32 PM  Additional Follow-up for Phone Call Additional follow up Details #1::        Pt informed  Additional Follow-up by: Lamar Sprinkles, CMA,  May 17, 2010 6:01 PM    New/Updated Medications: PREDNISONE 10 MG TABS (PREDNISONE) Take 40mg  qd for 3 days, then 20 mg qd for 3 days, then 10mg  qd for 6 days, then stop. Take pc. Prescriptions: PREDNISONE 10 MG TABS (PREDNISONE) Take 40mg  qd for 3 days, then 20 mg qd for 3 days, then 10mg  qd for 6 days, then stop. Take pc.  #24 x 1   Entered by:   Lamar Sprinkles, CMA   Authorized by:   Tresa Garter MD   Signed by:   Lamar Sprinkles, CMA on 05/17/2010   Method used:   [facility]        [facility]  [address] [phone][facility] (retail)       [address]       [place], [place]  [id number]       Ph: [phone] or [phone]       Fax: [phone]   RxID:   [id number]

## 2010-12-15 NOTE — Assessment & Plan Note (Signed)
Summary: CPX/[facility]/#/CD   Vital Signs:  Patient profile:   40 year old female Weight:      305.50 pounds O2 Sat:      96 % on Room air Temp:     98.4 degrees F oral Pulse rate:   73 / minute BP sitting:   104 / 80  (left arm) Cuff size:   large  Vitals Entered By: Lucious Groves (April 14, 2010 9:38 AM)  O2 Flow:  Room air CC: CPX./kb Is Patient Diabetic? No Pain Assessment Patient in pain? no      Comments Patient notes that she is not taking Triamcinolone nor is she taking Prednisone./kb   CC:  CPX./kb.  History of Present Illness: The patient presents for a wellness examination  F/u anxiety and depression, feeling tense C/o pains in joints, fingers and ankles - bad C/o OSA - not using a mask  Current Medications (verified): 1)  Topamax 25 Mg  Tabs (Topiramate) .... 3 At [facility] 2)  Cyclobenzaprine Hcl 10 Mg  Tabs (Cyclobenzaprine Hcl) .[name].. 1 Two Times A Day Prn 3)  Clonazepam 1 Mg Tabs (Clonazepam) .... Two Times A Day  -  Pt Last Seen March 2010;  Please Make Return Office Visit For Further Refills 4)  Folic Acid 1 Mg Tabs (Folic Acid) .[name].. 1 Qd 5)  Proair Hfa 108 (90 Base) Mcg/act  Aers (Albuterol Sulfate) .... 2 Inh Q4h As Needed Shortness of Breath 6)  Hydrochlorothiazide 12.5 Mg  Tabs (Hydrochlorothiazide) .... Take 1 Tab  By Mouth Every Morning 7)  Metronidazole 0.75 % Gel (Metronidazole) .... Use Qhs 8)  Vitamin D3 1000 Unit  Tabs (Cholecalciferol) .[name].. 1 Qd 9)  Cymbalta 60 Mg Cpep (Duloxetine Hcl) .... Take One Capsule Daily  Allergies (verified): 1)  ! * Iv Contrast 2)  Requip  Past History:  Past Surgical History: Last updated: 09/30/2008 Tubal ligation Endometr ablation  Family History: Last updated: 09/12/2007 Family History of CAD Female 1st degree relative <50  F Family History of CAD Female 1st degree relative <60  GM M A. fib  Past Medical History: Asthma Depression Hypertension, mild Low back pain Allergic rhinitis Obesity OSA Dr  Shelle Iron - unable to use CPAP  Family History: Reviewed history from 09/12/2007 and no changes required. Family History of CAD Female 1st degree relative <50  F Family History of CAD Female 1st degree relative <60  GM M A. fib  Social History: Reviewed history from 09/30/2008 and no changes required.  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  The patient denies anorexia, fever, weight loss, vision loss, decreased hearing, hoarseness, chest pain, syncope, dyspnea on exertion, peripheral edema, prolonged cough, headaches, hemoptysis, abdominal pain, melena, hematochezia, severe indigestion/heartburn, hematuria, incontinence, genital sores, muscle weakness, suspicious skin lesions, transient blindness, difficulty walking, unusual weight change, abnormal bleeding, enlarged lymph nodes, angioedema, and breast masses.    Physical Exam  General:  overweight-appearing.   Head:  Normocephalic and atraumatic without obvious abnormalities. No apparent alopecia or balding. Eyes:  No corneal or conjunctival inflammation noted. EOMI. Perrla.  Ears:  External ear exam shows no significant lesions or deformities.  Otoscopic examination reveals clear canals, tympanic membranes are intact bilaterally without bulging, retraction, inflammation or discharge. Hearing is grossly normal bilaterally. Nose:  External nasal examination shows no deformity or inflammation. Nasal mucosa are pink and moist  without lesions or exudates. Mouth:  Oral mucosa and a small oropharynx without lesions or exudates.  Teeth in good repair. Neck:  No deformities, masses, or tenderness noted. Chest Wall:  No deformities, masses, or tenderness noted. Lungs:  Normal respiratory effort, chest expands symmetrically. Lungs are clear to auscultation, no crackles or wheezes. Heart:  Normal rate and regular rhythm. S1 and S2  normal without gallop, murmur, click, rub or other extra sounds. Abdomen:  Bowel sounds positive,abdomen soft and non-tender without masses, organomegaly or hernias noted. Obese Msk:  Lumbar-sacral spine is tender to palpation over paraspinal muscles and painfull with the ROM  Pulses:  R and L carotid,radial,femoral,dorsalis pedis and posterior tibial pulses are full and equal bilaterally Extremities:  trace left pedal edema and trace right pedal edema.   Neurologic:  No cranial nerve deficits noted. Station and gait are normal. Plantar reflexes are down-going bilaterally. DTRs are symmetrical throughout. Sensory, motor and coordinative functions appear intact. Skin:  Intact without suspicious lesions or rashes Cervical Nodes:  No lymphadenopathy noted Inguinal Nodes:  No significant adenopathy Psych:  Cognition and judgment appear intact. Alert and cooperative with normal attention span and concentration. No apparent delusions, illusions, hallucinationsnot suicidal and not homicidal.     Impression & Recommendations:  Problem # 1:  WELL ADULT EXAM (ICD-V70.0) Assessment Comment Only Health and age related issues were discussed. Available screening tests and vaccinations were discussed as well. Healthy life style including good diet and execise was discussed.  The labs were reviewed with the patient.  GYN q 1 year Orders: Tdap => 76yrs IM (16109) Admin 1st Vaccine (60454) Admin 1st Vaccine (State) [phone])  Problem # 2:  OBESITY (ICD-278.00) Assessment: Deteriorated See "Patient Instructions". Lap Band discussed  Problem # 3:  EDEMA (ICD-782.3) with PMS Assessment: Deteriorated  Her updated medication list for this problem includes:    Hydrochlorothiazide 12.5 Mg Tabs (Hydrochlorothiazide) .[name]... Take 1 tab  by mouth every morning    Furosemide 40 Mg Tabs (Furosemide) .[name]... 1 by mouth q am ( a water pill)  Problem # 4:  PARESTHESIA (ICD-782.0) Assessment:  Unchanged  Orders: TLB-B12, Serum-Total ONLY (14782-N56)  Problem # 5:  LOW BACK PAIN (ICD-724.2) Assessment: Deteriorated  Her updated medication list for this problem includes:    Cyclobenzaprine Hcl 10 Mg Tabs (Cyclobenzaprine hcl) .[name]... 1 two times a day prn    Tramadol Hcl 50 Mg Tabs (Tramadol hcl) .[name]... 1-2 tabs by mouth two times a day as needed pain  Problem # 6:  OBSTRUCTIVE SLEEP APNEA (ICD-327.23) Not using CPAP. Adviced to f/u w/Dr Clance  Complete Medication List: 1)  Topamax 25 Mg Tabs (Topiramate) .... 3 at hs 2)  Cyclobenzaprine Hcl 10 Mg Tabs (Cyclobenzaprine hcl) .[name].. 1 two times a day prn 3)  Clonazepam 1 Mg Tabs (Clonazepam) .... Two times a day  -  pt last seen march 2010;  please make return office visit for further refills 4)  Folic Acid 1 Mg Tabs (Folic acid) .[name].. 1 qd 5)  Proair Hfa 108 (90 Base) Mcg/act Aers (Albuterol sulfate) .... 2 inh q4h as needed shortness of breath 6)  Hydrochlorothiazide 12.5 Mg Tabs (Hydrochlorothiazide) .... Take 1 tab  by mouth every morning 7)  Metronidazole 0.75 % Gel (Metronidazole) .... Use qhs 8)  Vitamin D3 1000 Unit Tabs (Cholecalciferol) .[name].. 1 qd 9)  Cymbalta 60 Mg Cpep (Duloxetine hcl) .... Take one capsule daily 10)  Furosemide 40 Mg Tabs (Furosemide) .[name].. 1 by mouth q am (  a water pill) 11)  Tramadol Hcl 50 Mg Tabs (Tramadol hcl) .[name].. 1-2 tabs by mouth two times a day as needed pain  Patient Instructions: 1)  Please schedule a follow-up appointment in 3 months. 2)  Contour pillow 3)  Weight watchers 4)  [email or web address] 5)  Try to eat more raw plant food, fresh and dry fruit, raw almonds, leafy vegetables, whole foods and less red meat, less animal fat. Poultry and fish is better for you than pork and beef. Avoid processed foods (canned soups, hot dogs, sausage, bacon , frozen dinners). Avoid corn syrup, high fructose syrup or aspartam and Splenda  containing drinks. Honey, Agave and Stevia are better  sweeteners. Make your own  dressing with olive oil, wine vinegar, lemon juce, garlic etc. for your salads. Prescriptions: CYMBALTA 60 MG CPEP (DULOXETINE HCL) take one capsule daily  #90 x 3   Entered and Authorized by:   Tresa Garter MD   Signed by:   Tresa Garter MD on 04/14/2010   Method used:   Print then Give to Patient   RxID:   [id number] VITAMIN D3 1000 UNIT  TABS (CHOLECALCIFEROL) 1 qd  #100 x 3   Entered and Authorized by:   Tresa Garter MD   Signed by:   Tresa Garter MD on 04/14/2010   Method used:   Print then Give to Patient   RxID:   [id number] METRONIDAZOLE 0.75 % GEL (METRONIDAZOLE) use qhs  #90 g x 3   Entered and Authorized by:   Tresa Garter MD   Signed by:   Tresa Garter MD on 04/14/2010   Method used:   Print then Give to Patient   RxID:   [id number] PROAIR HFA 108 (90 BASE) MCG/ACT  AERS (ALBUTEROL SULFATE) 2 inh q4h as needed shortness of breath  #3 x 3   Entered and Authorized by:   Tresa Garter MD   Signed by:   Tresa Garter MD on 04/14/2010   Method used:   Print then Give to Patient   RxID:   [id number] CLONAZEPAM 1 MG TABS (CLONAZEPAM) two times a day  -  pt last seen march 2010;  please make Return Office visit for further refills  #180 x 1   Entered and Authorized by:   Tresa Garter MD   Signed by:   Tresa Garter MD on 04/14/2010   Method used:   Print then Give to Patient   RxID:   [id number] CYCLOBENZAPRINE HCL 10 MG  TABS (CYCLOBENZAPRINE HCL) 1 two times a day prn  #90 x 3   Entered and Authorized by:   Tresa Garter MD   Signed by:   Tresa Garter MD on 04/14/2010   Method used:   Print then Give to Patient   RxID:   [id number] TOPAMAX 25 MG  TABS (TOPIRAMATE) 3 at hs  #270 x 3   Entered and Authorized by:   Tresa Garter MD   Signed by:   Tresa Garter MD on 04/14/2010   Method used:   Print then Give to Patient    RxID:   [id number] TRAMADOL HCL 50 MG TABS (TRAMADOL HCL) 1-2 tabs by mouth two times a day as needed pain  #120 x 3   Entered and Authorized by:   Tresa Garter MD   Signed by:   Tresa Garter MD on 04/14/2010   Method used:   Print then  Give to Patient   RxID:   [id number] FUROSEMIDE 40 MG TABS (FUROSEMIDE) 1 by mouth q am ( a water pill)  #30 x 6   Entered and Authorized by:   Tresa Garter MD   Signed by:   Tresa Garter MD on 04/14/2010   Method used:   Print then Give to Patient   RxID:   [id number]    Tetanus/Td Vaccine    Vaccine Type: Tdap    Site: right deltoid    Mfr: GlaxoSmithKline    Dose: 0.5 ml    Route: IM    Given by: Lucious Groves    Exp. Date: 02/05/2012    Lot #: [id number]    VIS given: 10/01/07 version given April 14, 2010.

## 2010-12-15 NOTE — Progress Notes (Signed)
Summary: C/o fatigue  Phone Note Call from Patient   Caller: Patient Summary of Call: pt states she cant afford Nuvigil  (even though it works).  She still c/o fatigue since she has stopped it.  What else could be causing this and should she have any other tests?   Follow-up for Phone Call        Pls send her a new Nuvigyl card with 30 d free and $5 copay Is she using CPAP for OSA? OV for reeval Follow-up by: Tresa Garter MD,  September 16, 2010 12:11 AM  Additional Follow-up for Phone Call Additional follow up Details #1::        Spoke w/pt, with savings card med is too expensive, pt only gets percent of rx covered and card only takes 50 dollars off 400 dollar balance. She also reports increase in anxiety in the evenings that klonopin does not help with. Strongly suggested she consider repeat sleep study as requested by MD. She states that CPAP masks make her claustrophobic and she never got one. I explained how OSA negativly affects multiple areas of her body, more than just feeling sleepy. She will consider and call back if needed.  Additional Follow-up by: Lamar Sprinkles, CMA,  September 16, 2010 6:15 PM    Additional Follow-up for Phone Call Additional follow up Details #2::    Agree. CPAP needs to be adddressed - she can try nasal mask - it is small.  New card should give her very good savings.  Thank you!  Follow-up by: Tresa Garter MD,  September 18, 2010 12:33 PM  Additional Follow-up for Phone Call Additional follow up Details #3:: Details for Additional Follow-up Action Taken: Also previously explained to pt there are many varieties of CPAP masks and to work w/a good supplier who will understand her concerns to get the best results................Marland KitchenLamar Sprinkles, CMA  September 19, 2010 10:33 AM

## 2010-12-21 ENCOUNTER — Ambulatory Visit (INDEPENDENT_AMBULATORY_CARE_PROVIDER_SITE_OTHER): Payer: 59 | Admitting: Internal Medicine

## 2010-12-21 ENCOUNTER — Encounter: Payer: Self-pay | Admitting: Internal Medicine

## 2010-12-21 DIAGNOSIS — R404 Transient alteration of awareness: Secondary | ICD-10-CM

## 2010-12-21 DIAGNOSIS — R5381 Other malaise: Secondary | ICD-10-CM

## 2010-12-21 DIAGNOSIS — I10 Essential (primary) hypertension: Secondary | ICD-10-CM

## 2010-12-21 DIAGNOSIS — F329 Major depressive disorder, single episode, unspecified: Secondary | ICD-10-CM
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
## 2010-12-29 NOTE — Assessment & Plan Note (Signed)
Summary: rx check-lb   Vital Signs:  Patient profile:   40 year old female Height:      68 inches Weight:      309 pounds BMI:     47.15 Temp:     97.7 degrees F oral Pulse rate:   98 / minute Pulse rhythm:   regular Resp:     16 per minute BP sitting:   120 / 70  (left arm) Cuff size:   large  Vitals Entered By: Lanier Prude, CMA(AAMA) (December 21, 2010 9:30 AM) CC: f/u  Is Patient Diabetic? No   Primary Care Provider:  Tresa Garter MD  CC:  f/u .  History of Present Illness: The patient presents for a follow up of back pain/OA, anxiety, depression and headaches.   Current Medications (verified): 1)  Topamax 25 Mg  Tabs (Topiramate) .... 3 At [facility] 2)  Cyclobenzaprine Hcl 10 Mg  Tabs (Cyclobenzaprine Hcl) .[name].. 1 Two Times A Day Prn 3)  Clonazepam 1 Mg Tabs (Clonazepam) .... Two Times A Day 4)  Folic Acid 1 Mg Tabs (Folic Acid) .[name].. 1 Qd 5)  Proair Hfa 108 (90 Base) Mcg/act  Aers (Albuterol Sulfate) .... 2 Inh Q4h As Needed Shortness of Breath 6)  Metronidazole 0.75 % Gel (Metronidazole) .... Use Qhs 7)  Vitamin D3 1000 Unit  Tabs (Cholecalciferol) .[name].. 1 Qd 8)  Cymbalta 60 Mg Cpep (Duloxetine Hcl) .... Take One Capsule Daily 9)  Furosemide 40 Mg Tabs (Furosemide) .[name].. 1 By Mouth Q Am ( A Water Pill) As Needed 10)  Tramadol Hcl 50 Mg Tabs (Tramadol Hcl) .[name].. 1-2 Tabs By Mouth Two Times A Day As Needed Pain 11)  Trazodone Hcl 50 Mg Tabs (Trazodone Hcl) .... 2 By Mouth Qhs  Allergies (verified): 1)  ! 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  The patient denies weight gain and chest pain.     Physical Exam  General:  overweight-appearing.  NAD Nose:  External nasal examination shows no deformity or inflammation. Nasal mucosa are pink and moist without lesions or exudates. Mouth:  Oral mucosa and a small oropharynx without lesions or exudates.  Teeth in good repair. Neck:  No deformities, masses, or tenderness noted. Lungs:  Normal respiratory effort, chest expands symmetrically. Lungs are clear to auscultation, no crackles or wheezes. Heart:  Normal rate and regular rhythm. S1 and S2 normal without gallop, murmur, click, rub or other extra sounds. Abdomen:  Bowel sounds positive,abdomen soft and non-tender without masses, organomegaly or hernias noted. Obese Msk:  Lumbar-sacral spine is tender to palpation over paraspinal muscles and painfull with the ROM  Neurologic:  No cranial nerve deficits noted. Station and gait are normal. Plantar reflexes are down-going bilaterally. DTRs are symmetrical throughout. Sensory, motor and coordinative functions appear intact. Skin:  Intact without suspicious lesions or rashes Psych:  Cognition and judgment appear intact. Alert and cooperative with normal attention span and concentration. No apparent delusions, illusions, hallucinationsnot suicidal and not homicidal.     Impression & Recommendations:  Problem # 1:  SOMNOLENCE (ICD-780.09) Assessment Improved  Problem #  2:  OBSTRUCTIVE SLEEP APNEA (ICD-327.23) Assessment: Improved On CPAP now  Problem # 3:  DEPRESSION (ICD-311) Assessment: Improved  Her updated medication list for this problem includes:    Clonazepam 1 Mg Tabs (Clonazepam) .[name]..[name] Two times a day    Cymbalta 60 Mg Cpep (Duloxetine hcl) .[name]... Take one capsule daily    Trazodone Hcl 50 Mg Tabs (Trazodone hcl) .[name]... 2 by mouth qhs  Problem # 4:  HYPERTENSION (ICD-401.9) Assessment: Improved  Her updated medication list for this problem includes:    Furosemide 40 Mg Tabs (Furosemide) .[name]... 1 by mouth q am ( a water  pill) as needed  BP today: 120/70 Prior BP: 134/78 (12/15/2010)  Labs Reviewed: K+: 4.3 (08/01/2010) Creat: : 0.8 (08/01/2010)   Chol: 190 (04/14/2010)   HDL: 51.20 (04/14/2010)   LDL: 114 (04/14/2010)   TG: 124.0 (04/14/2010)  Complete Medication List: 1)  Topamax 25 Mg Tabs (Topiramate) .... 3 at hs 2)  Cyclobenzaprine Hcl 10 Mg Tabs (Cyclobenzaprine hcl) .[name].. 1 two times a day prn 3)  Clonazepam 1 Mg Tabs (Clonazepam) .... Two times a day 4)  Folic Acid 1 Mg Tabs (Folic acid) .[name].. 1 qd 5)  Proair Hfa 108 (90 Base) Mcg/act Aers (Albuterol sulfate) .... 2 inh q4h as needed shortness of breath 6)  Metronidazole 0.75 % Gel (Metronidazole) .... Use qhs 7)  Vitamin D3 1000 Unit Tabs (Cholecalciferol) .[name].. 1 qd 8)  Cymbalta 60 Mg Cpep (Duloxetine hcl) .... Take one capsule daily 9)  Furosemide 40 Mg Tabs (Furosemide) .[name].. 1 by mouth q am ( a water pill) as needed 10)  Tramadol Hcl 50 Mg Tabs (Tramadol hcl) .[name].. 1-2 tabs by mouth two times a day as needed pain 11)  Trazodone Hcl 50 Mg Tabs (Trazodone hcl) .... 2 by mouth qhs  Patient Instructions: 1)  Please schedule a follow-up appointment in  4-6 months well w/labs. Prescriptions: CYMBALTA 60 MG CPEP (DULOXETINE HCL) take one capsule daily  #30 x 3   Entered and Authorized by:   Tresa Garter MD   Signed by:   Tresa Garter MD on 12/21/2010   Method used:   Print then Give to Patient   RxID:   [id number]    Orders Added: 1)  Est. Patient Level IV [14782]   Immunization History:  Influenza Immunization History:    Influenza:  historical (08/24/2010)   Immunization History:  Influenza Immunization History:    Influenza:  Historical (08/24/2010)

## 2011-03-31 NOTE — Procedures (Signed)
Sylvia Alvarado, Sylvia Alvarado                ACCOUNT NO.:  [id number]   MEDICAL RECORD NO.:  [id number]          PATIENT TYPE:  OUT   LOCATION:  SLEEP CENTER                 FACILITY:  [facility]   PHYSICIAN:  Marcelyn Bruins, M.D. [facility] DATE OF BIRTH:  03/23/1971   DATE OF STUDY:  12/28/2005                              NOCTURNAL POLYSOMNOGRAM   REFERRING PHYSICIAN:  Dr. Georgina Quint. Plotnikov.   DATE OF STUDY:  December 28, 2005.   INDICATION FOR STUDY:  Persistent disorder of initiating and maintaining  both sleep and wakefulness.   EPWORTH SCORE:  5.   SLEEP ARCHITECTURE:  The patient had a total sleep time of 402 minutes with  decreased slow wave sleep and REM. Sleep onset latency was normal. However,  REM onset was prolonged at 284 minutes. Sleep efficiency was surprisingly  good at 95%.   RESPIRATORY DATA:  The patient was found to have 171 hypopneas and 15 apneas  for a respiratory disturbance index of 28 events per hour. Events were  clearly worse in the supine position and moderate snoring was noted  throughout.   OXYGEN DATA:  There was O2 desaturation as low as 91%.   CARDIAC DATA:  No clinically significant cardiac arrhythmias.   MOVEMENT/PARASOMNIA:  There were no clinically significant leg jerks or  abnormal behaviors noted.   IMPRESSION/RECOMMENDATIONS:  1.  Moderate obstructive sleep apnea/hypopnea syndrome with a respiratory      disturbance index of 28 events per hour and O2 desaturation as low as      91%. Treatment for this degree of sleep apnea should focus primarily on      weight loss as well as C-PAP. Consideration could be given to both upper      airway surgery and oral appliance; however, typical success rates for      this degree of sleep apnea are lower than with C-PAP.  2.  The patient should be counseled regarding activities which could be      affected by sleepiness such as driving and operating heavy machinery.           ______________________________  Marcelyn Bruins, M.D. [facility]  Diplomate, American Board of Sleep  Medicine     KC/MEDQ  D:  01/08/2006 15:30:33  T:  01/09/2006 00:22:51  Job:  [id number]

## 2011-03-31 NOTE — Assessment & Plan Note (Signed)
[facility]                           PRIMARY CARE OFFICE NOTE   NAME:Sylvia Alvarado, Sylvia Alvarado                       MRN:          [id number]  DATE:01/09/2007                            DOB:          1971/01/07    The patient is a 40 year old female who presents for wellness  examination. For Family History, Social History as per December 26, 2004  note. Her daughter is 10 now, and they have been quite busy with sports,  cheerleading, and soccer.   REVIEW OF SYSTEMS:  Continues to have obstructive sleep apnea. Decided  not to be treated. Complains of face flushing at least daily triggered  by stress, spicy food, alcohol accompanied with very hot sensation that  lasts for under an hour. Headaches when bending over to tie her shoes,  etc., of several months' duration. No syncope. Migraines as before  unchanged.   CURRENT MEDICATIONS:  1. Maxalt p.r.n. 10 mg.  2. Topamax 75 mg daily.  3. Cymbalta 60 mg daily.  4. Klonopin 1 mg b.i.d.  5. Flexeril p.r.n. migraines.   ALLERGIES:  PAXIL, IV CONTRAST DYE. She developed sleep walking on  Requip and stopped it.   The rest of the history and review of systems is negative.   PHYSICAL EXAMINATION:  VITAL SIGNS:  Blood pressure 130/88, pulse 78,  temp 97.3, weight 291 pounds. She is overweight.  HEENT:  Moist mucosa.  NECK:  Supple, no thyromegaly or bruit.  LUNGS:  Clear. No wheeze or rales.  HEART:  Heart sounds S1, S2, no murmur, no gallop.  ABDOMEN:  Soft, nontender without organomegaly or masses.  EXTREMITIES:  Lower extremities without edema.  NEUROLOGICAL:  She is alert, oriented, appropriate. Denies being  depressed. Pupils reactive to light.  Deep tendon reflexes within normal  limits. Facial skin with slight pinkish discoloration suspicious for  rosacea. There is a plantar wart on the right foot in the middle of the  forefoot.   LABORATORY DATA:  On February 28 CBC is normal, SMA normal.  Cholesterol  183, HDL 47. B12 370. TSH normal. A1c 5.5%. Urinalysis normal.   ASSESSMENT/PLAN:  1. Normal wellness examination. Age/health related issues discussed.      Healthy lifestyle discussed. She did schedule the gyn  consultation      with Dr. Randell Patient. Advised to lose weight. Diet under 1200 calories a      day advised. Tetanus shot given.  2. Sweat/hot flashes likely related to rosacea. Given doxycycline 100      p.o. daily for one month and then may continue once daily.  3. Metrogel on the face once daily. I will see her back in two months.  4. Right foot plantar wart. Hyperkeratosis was removed with blade.      Will perform cryosurgery.  5. Headache with bending. Sinus disease versus increased venous      pressure. Will see what doxycycline would do. Obtain MRI if she      shows no improvement. Also consider neurology consult. Otherwise,      medications are unchanged.   PROCEDURE:  Cryosurgery. Indication:  Plantar wart. Wart on the right  foot was treated with liquid nitrogen in the usual fashion.  Complications:  None.     Georgina Quint. Plotnikov, MD  Electronically Signed    AVP/MedQ  DD: 01/09/2007  DT: 01/09/2007  Job #: [id number]

## 2011-04-20 ENCOUNTER — Telehealth: Payer: Self-pay | Admitting: *Deleted

## 2011-04-20 NOTE — Telephone Encounter (Signed)
Download 01/12/2011-04/03/2011--Showed no residual events when used, try to use every night. Per RA.

## 2011-05-25 ENCOUNTER — Telehealth: Payer: Self-pay | Admitting: *Deleted

## 2011-05-25 MED ORDER — CLONAZEPAM 1 MG PO TABS: 1.0000 mg | ORAL_TABLET | Freq: Two times a day (BID) | ORAL | Status: DC | PRN

## 2011-05-25 NOTE — Telephone Encounter (Signed)
OK to fill this prescription with additional refills x5 Thank you!  

## 2011-05-25 NOTE — Telephone Encounter (Signed)
Rec Rf req for Clonazepam 1 mg. 1 po bid # 180. Last filled 03-23-11. Ok to RF?

## 2011-07-13 ENCOUNTER — Telehealth: Payer: Self-pay | Admitting: *Deleted

## 2011-07-13 NOTE — Telephone Encounter (Signed)
Left detailed mess informing pt of below.  

## 2011-07-13 NOTE — Telephone Encounter (Signed)
Left mess for patient to call back to advise Cymbalta samples are upfront and to schedule OV. She was last seen in 12/2010 and Dr. Posey Rea wanted her back in 4-6 mo.

## 2011-07-19 ENCOUNTER — Other Ambulatory Visit: Payer: Self-pay | Admitting: Internal Medicine

## 2011-09-06 ENCOUNTER — Encounter: Payer: Self-pay | Admitting: Internal Medicine

## 2011-09-06 ENCOUNTER — Encounter: Payer: Self-pay | Admitting: *Deleted

## 2011-09-06 ENCOUNTER — Ambulatory Visit (INDEPENDENT_AMBULATORY_CARE_PROVIDER_SITE_OTHER): Payer: 59 | Admitting: Internal Medicine

## 2011-09-06 VITALS — BP 118/70 | HR 68 | Temp 98.3°F | Resp 16 | Wt 318.0 lb

## 2011-09-06 DIAGNOSIS — E669 Obesity, unspecified: Secondary | ICD-10-CM

## 2011-09-06 DIAGNOSIS — F3289 Other specified depressive episodes: Secondary | ICD-10-CM

## 2011-09-06 DIAGNOSIS — Z23 Encounter for immunization: Secondary | ICD-10-CM

## 2011-09-06 DIAGNOSIS — F329 Major depressive disorder, single episode, unspecified: Secondary | ICD-10-CM

## 2011-09-06 DIAGNOSIS — I10 Essential (primary) hypertension: Secondary | ICD-10-CM

## 2011-09-06 DIAGNOSIS — M545 Low back pain, unspecified: Secondary | ICD-10-CM

## 2011-09-06 DIAGNOSIS — IMO0001 Reserved for inherently not codable concepts without codable children: Secondary | ICD-10-CM

## 2011-09-06 DIAGNOSIS — F911 Conduct disorder, childhood-onset type: Secondary | ICD-10-CM

## 2011-09-06 DIAGNOSIS — F32A Depression, unspecified: Secondary | ICD-10-CM

## 2011-09-06 DIAGNOSIS — M797 Fibromyalgia: Secondary | ICD-10-CM

## 2011-09-06 DIAGNOSIS — R454 Irritability and anger: Secondary | ICD-10-CM

## 2011-09-06 MED ORDER — CLONAZEPAM 1 MG PO TABS: 1.0000 mg | ORAL_TABLET | Freq: Three times a day (TID) | ORAL | Status: DC | PRN

## 2011-09-06 MED ORDER — TOPIRAMATE 25 MG PO TABS: 75.0000 mg | ORAL_TABLET | Freq: Every day | ORAL | Status: DC

## 2011-09-06 MED ORDER — TRAZODONE HCL 50 MG PO TABS: 100.0000 mg | ORAL_TABLET | Freq: Every day | ORAL | Status: DC

## 2011-09-06 NOTE — Assessment & Plan Note (Signed)
Diet discussed Wt Readings from Last 3 Encounters:  09/06/11 318 lb (144.244 kg)  12/21/10 309 lb (140.161 kg)  12/15/10 311 lb 4 oz (141.182 kg)

## 2011-09-06 NOTE — Assessment & Plan Note (Signed)
Continue with current prescription therapy as reflected on the Med list. Considering Lithium

## 2011-09-06 NOTE — Assessment & Plan Note (Signed)
Worse 10/12. Klonopin  Was made tid. Psychol consult. We will start Lithium if not better - rtc 6 wks

## 2011-09-06 NOTE — Progress Notes (Signed)
  Subjective:    Patient ID: Sylvia Alvarado, female    DOB: Mar 28, 1971, 40 y.o.   MRN: [id number]  HPI   The patient is here to follow up on chronic depression, anxiety, headaches and chronic moderate LBP and FMS symptoms controlled partially with medicines. C/o anger, stress.  Wt Readings from Last 3 Encounters:  09/06/11 318 lb (144.244 kg)  12/21/10 309 lb (140.161 kg)  12/15/10 311 lb 4 oz (141.182 kg)     Review of Systems  Constitutional: Negative for chills, activity change, appetite change, fatigue and unexpected weight change.  HENT: Negative for congestion, mouth sores and sinus pressure.   Eyes: Negative for visual disturbance.  Respiratory: Negative for cough and chest tightness.   Gastrointestinal: Negative for nausea and abdominal pain.  Genitourinary: Negative for frequency, difficulty urinating and vaginal pain.  Musculoskeletal: Positive for back pain and arthralgias. Negative for gait problem.  Skin: Negative for pallor and rash.  Neurological: Negative for dizziness, tremors, weakness, numbness and headaches.  Psychiatric/Behavioral: Positive for sleep disturbance. Negative for suicidal ideas and confusion. The patient is not nervous/anxious.        Objective:   Physical Exam  Constitutional: She appears well-developed and well-nourished. No distress.  HENT:  Head: Normocephalic.  Right Ear: External ear normal.  Left Ear: External ear normal.  Nose: Nose normal.  Mouth/Throat: Oropharynx is clear and moist.  Eyes: Conjunctivae are normal. Pupils are equal, round, and reactive to light. Right eye exhibits no discharge. Left eye exhibits no discharge.  Neck: Normal range of motion. Neck supple. No JVD present. No tracheal deviation present. No thyromegaly present.  Cardiovascular: Normal rate, regular rhythm and normal heart sounds.   Pulmonary/Chest: No stridor. No respiratory distress. She has no wheezes.  Abdominal: Soft. Bowel sounds are normal. She  exhibits no distension and no mass. There is no tenderness. There is no rebound and no guarding.  Musculoskeletal: She exhibits no edema and no tenderness.  Lymphadenopathy:    She has no cervical adenopathy.  Neurological: She displays normal reflexes. No cranial nerve deficit. She exhibits normal muscle tone. Coordination normal.  Skin: No rash noted. No erythema.  Psychiatric: Her speech is normal. Judgment and thought content normal. Thought content is not paranoid and not delusional. She does not express inappropriate judgment. She exhibits a depressed mood. She expresses no homicidal and no suicidal ideation. She expresses no suicidal plans and no homicidal plans.       Tearful and sad          Assessment & Plan:

## 2011-09-06 NOTE — Assessment & Plan Note (Signed)
Continue with current prescription therapy as reflected on the Med list. Loose wt 

## 2011-09-06 NOTE — Assessment & Plan Note (Signed)
Continue with current prescription therapy as reflected on the Med list.  

## 2011-09-06 NOTE — Patient Instructions (Signed)
Check with [place] [facility]

## 2011-09-25 ENCOUNTER — Telehealth: Payer: Self-pay | Admitting: *Deleted

## 2011-09-25 NOTE — Telephone Encounter (Signed)
Pt requesting refill on cyclobenzaprine, please Advise refill

## 2011-09-25 NOTE — Telephone Encounter (Signed)
OK to fill this prescription with additional refills x3 Thank you!  

## 2011-09-26 MED ORDER — CYCLOBENZAPRINE HCL 10 MG PO TABS: 10.0000 mg | ORAL_TABLET | Freq: Two times a day (BID) | ORAL | Status: DC | PRN

## 2011-10-30 ENCOUNTER — Other Ambulatory Visit: Payer: Self-pay | Admitting: *Deleted

## 2011-10-30 MED ORDER — DULOXETINE HCL 60 MG PO CPEP: 60.0000 mg | ORAL_CAPSULE | Freq: Every day | ORAL | Status: DC

## 2011-12-04 ENCOUNTER — Other Ambulatory Visit: Payer: Self-pay | Admitting: *Deleted

## 2011-12-04 MED ORDER — DULOXETINE HCL 60 MG PO CPEP: 60.0000 mg | ORAL_CAPSULE | Freq: Every day | ORAL | Status: DC

## 2012-01-23 ENCOUNTER — Encounter: Payer: Self-pay | Admitting: Pulmonary Disease

## 2012-02-22 ENCOUNTER — Other Ambulatory Visit: Payer: Self-pay | Admitting: *Deleted

## 2012-02-22 ENCOUNTER — Telehealth: Payer: Self-pay | Admitting: *Deleted

## 2012-02-22 MED ORDER — DULOXETINE HCL 60 MG PO CPEP: 60.0000 mg | ORAL_CAPSULE | Freq: Every day | ORAL | Status: DC

## 2012-02-22 NOTE — Telephone Encounter (Signed)
Pt faxed in a request for Cymbalta 60 mg samples. I called her to advise her than we have no samples. Rf sent in per pt request to [facility].

## 2012-03-13 ENCOUNTER — Telehealth: Payer: Self-pay | Admitting: *Deleted

## 2012-03-13 MED ORDER — CLONAZEPAM 1 MG PO TABS: 1.0000 mg | ORAL_TABLET | Freq: Three times a day (TID) | ORAL | Status: DC | PRN

## 2012-03-13 NOTE — Telephone Encounter (Signed)
Rf req for Clonazepam 1 mg 1 po tid prn for anxiety. # 270. Ok to Rf?

## 2012-03-13 NOTE — Telephone Encounter (Signed)
Done

## 2012-03-13 NOTE — Telephone Encounter (Signed)
OK to fill this prescription with additional refills x0 Sch OV Thank you!  

## 2012-03-19 ENCOUNTER — Other Ambulatory Visit: Payer: Self-pay | Admitting: Internal Medicine

## 2012-03-20 ENCOUNTER — Telehealth: Payer: Self-pay | Admitting: *Deleted

## 2012-03-20 MED ORDER — TRAZODONE HCL 50 MG PO TABS: 100.0000 mg | ORAL_TABLET | Freq: Every day | ORAL | Status: DC

## 2012-03-20 NOTE — Telephone Encounter (Signed)
Requested Medications     traZODone (DESYREL) 50 MG tablet [Pharmacy Med Name: TRAZODONE 50 MG TABLET TAB 50 MG]   TAKE 2 TABLETS BY MOUTH AT BEDTIME.   Disp: 180 tablet R: 1 Start: 03/19/2012  Class: Normal   Requested on: 09/06/2011   Originally ordered on: 07/19/2011  Last refill: 02/17/2012

## 2012-03-20 NOTE — Telephone Encounter (Signed)
OK to fill this prescription with additional refills x2 Thank you!  

## 2012-03-20 NOTE — Telephone Encounter (Signed)
Done

## 2012-06-11 ENCOUNTER — Telehealth: Payer: Self-pay | Admitting: *Deleted

## 2012-06-11 NOTE — Telephone Encounter (Signed)
Pt states she is eligible for ins with her new employer. However, there will be a waiting period for all her pre-existing conditions (anxiety&depressiona and asthma). She is requesting a letter of medical necessity for this. Please advise. What should the letter state?

## 2012-06-11 NOTE — Telephone Encounter (Signed)
I'm not sure. Thx 

## 2012-06-13 NOTE — Telephone Encounter (Signed)
Left mess for patient to call back.  

## 2012-06-14 MED ORDER — ALBUTEROL SULFATE HFA 108 (90 BASE) MCG/ACT IN AERS: 2.0000 | INHALATION_SPRAY | Freq: Four times a day (QID) | RESPIRATORY_TRACT | Status: DC | PRN

## 2012-06-14 MED ORDER — DULOXETINE HCL 60 MG PO CPEP: 60.0000 mg | ORAL_CAPSULE | Freq: Every day | ORAL | Status: DC

## 2012-06-14 NOTE — Telephone Encounter (Signed)
Rec another fax from pt stating the letter of medical necessity is NOT needed. She does need a new Rx for Cymbalta sent to pharmacy. Done. Pt informed

## 2012-07-01 ENCOUNTER — Telehealth: Payer: Self-pay | Admitting: *Deleted

## 2012-07-01 NOTE — Telephone Encounter (Signed)
OK to fill this prescription with additional refills x1 ROV Thank you!  

## 2012-07-01 NOTE — Telephone Encounter (Signed)
Rf req for Cyclobenzaprine 10 mg 1 po bid prn # 60. Last filled 05/15/12. Ok to Rf?

## 2012-07-02 MED ORDER — CYCLOBENZAPRINE HCL 10 MG PO TABS: 10.0000 mg | ORAL_TABLET | Freq: Two times a day (BID) | ORAL | Status: DC | PRN

## 2012-07-02 NOTE — Telephone Encounter (Signed)
Done

## 2012-07-31 ENCOUNTER — Telehealth: Payer: Self-pay | Admitting: *Deleted

## 2012-07-31 NOTE — Telephone Encounter (Signed)
Rf req for Clonazepam 1 mg 1 po tid prn. # 270. Last filled 06/24/12. Ok to Rf?

## 2012-07-31 NOTE — Telephone Encounter (Signed)
OK to fill this prescription with additional refills x0 Needs OV Thank you!  

## 2012-08-01 MED ORDER — CLONAZEPAM 1 MG PO TABS: 1.0000 mg | ORAL_TABLET | Freq: Three times a day (TID) | ORAL | Status: DC | PRN

## 2012-08-01 NOTE — Telephone Encounter (Signed)
Done

## 2012-08-08 ENCOUNTER — Other Ambulatory Visit: Payer: Self-pay | Admitting: General Practice

## 2012-08-08 NOTE — Telephone Encounter (Signed)
Ok if we have Thx 

## 2012-08-08 NOTE — Telephone Encounter (Signed)
Pt would like to know if it is possible to receive samples of Cymbalta 60mg . Please Advise.

## 2012-08-09 NOTE — Telephone Encounter (Signed)
We dont have any samples at this time. Left detailed mess informing pt.

## 2012-09-02 ENCOUNTER — Other Ambulatory Visit: Payer: Self-pay | Admitting: *Deleted

## 2012-09-02 MED ORDER — DULOXETINE HCL 60 MG PO CPEP: 60.0000 mg | ORAL_CAPSULE | Freq: Every day | ORAL | Status: DC

## 2012-09-03 ENCOUNTER — Telehealth: Payer: Self-pay | Admitting: *Deleted

## 2012-09-03 NOTE — Telephone Encounter (Signed)
Pt is scheduled for CPE in 11/2012. She request to have order for CPE labs as well as Hgb A1c and TSH to be faxed to [phone] so she can have them drawn at her work. Ok?

## 2012-09-03 NOTE — Telephone Encounter (Signed)
Done - see Rx Thx

## 2012-09-03 NOTE — Telephone Encounter (Signed)
Pt informed all we have is Cymbalta 30 mg samples. They are upfront for pick up.

## 2012-09-04 NOTE — Telephone Encounter (Signed)
Rx faxed to pt  

## 2012-09-18 ENCOUNTER — Other Ambulatory Visit: Payer: Self-pay | Admitting: *Deleted

## 2012-09-18 MED ORDER — TOPIRAMATE 25 MG PO TABS: 75.0000 mg | ORAL_TABLET | Freq: Every day | ORAL | Status: DC

## 2012-09-18 MED ORDER — CYCLOBENZAPRINE HCL 10 MG PO TABS: 10.0000 mg | ORAL_TABLET | Freq: Two times a day (BID) | ORAL | Status: DC | PRN

## 2012-09-18 NOTE — Telephone Encounter (Signed)
R'cd fax from [facility] for refill of Topiramate.

## 2012-09-18 NOTE — Telephone Encounter (Signed)
Also received fax for refill of Cyclobenzaprine.

## 2012-09-18 NOTE — Addendum Note (Signed)
Addended by: Brenton Grills C on: 09/18/2012 11:44 AM   Modules accepted: Orders

## 2012-10-17 ENCOUNTER — Other Ambulatory Visit: Payer: Self-pay | Admitting: *Deleted

## 2012-10-17 DIAGNOSIS — Z0000 Encounter for general adult medical examination without abnormal findings: Secondary | ICD-10-CM

## 2012-10-17 MED ORDER — DULOXETINE HCL 60 MG PO CPEP: 60.0000 mg | ORAL_CAPSULE | Freq: Every day | ORAL | Status: DC

## 2012-11-15 ENCOUNTER — Encounter: Payer: Self-pay | Admitting: Internal Medicine

## 2012-11-15 ENCOUNTER — Ambulatory Visit (INDEPENDENT_AMBULATORY_CARE_PROVIDER_SITE_OTHER): Payer: BC Managed Care – PPO | Admitting: Internal Medicine

## 2012-11-15 VITALS — BP 120/80 | HR 80 | Temp 97.5°F | Resp 16 | Ht 67.0 in | Wt 307.0 lb

## 2012-11-15 DIAGNOSIS — IMO0001 Reserved for inherently not codable concepts without codable children: Secondary | ICD-10-CM

## 2012-11-15 DIAGNOSIS — Z9989 Dependence on other enabling machines and devices: Secondary | ICD-10-CM | POA: Insufficient documentation

## 2012-11-15 DIAGNOSIS — R454 Irritability and anger: Secondary | ICD-10-CM

## 2012-11-15 DIAGNOSIS — M797 Fibromyalgia: Secondary | ICD-10-CM

## 2012-11-15 DIAGNOSIS — I10 Essential (primary) hypertension: Secondary | ICD-10-CM

## 2012-11-15 DIAGNOSIS — Z0000 Encounter for general adult medical examination without abnormal findings: Secondary | ICD-10-CM | POA: Insufficient documentation

## 2012-11-15 DIAGNOSIS — F911 Conduct disorder, childhood-onset type: Secondary | ICD-10-CM

## 2012-11-15 DIAGNOSIS — F329 Major depressive disorder, single episode, unspecified: Secondary | ICD-10-CM

## 2012-11-15 DIAGNOSIS — J45909 Unspecified asthma, uncomplicated: Secondary | ICD-10-CM

## 2012-11-15 DIAGNOSIS — G4733 Obstructive sleep apnea (adult) (pediatric): Secondary | ICD-10-CM

## 2012-11-15 DIAGNOSIS — E669 Obesity, unspecified: Secondary | ICD-10-CM

## 2012-11-15 MED ORDER — TRAMADOL HCL 50 MG PO TABS: 50.0000 mg | ORAL_TABLET | Freq: Four times a day (QID) | ORAL | Status: DC | PRN

## 2012-11-15 MED ORDER — METFORMIN HCL 500 MG PO TABS: 500.0000 mg | ORAL_TABLET | Freq: Every day | ORAL | Status: DC

## 2012-11-15 MED ORDER — LORCASERIN HCL 10 MG PO TABS: 1.0000 | ORAL_TABLET | Freq: Two times a day (BID) | ORAL | Status: DC

## 2012-11-15 NOTE — Assessment & Plan Note (Signed)
Continue with current prescription therapy as reflected on the Med list.  

## 2012-11-15 NOTE — Progress Notes (Signed)
Subjective:    Patient ID: Sylvia Alvarado, female    DOB: [birth year], 41 y.o.   MRN: [id number]  HPI  The patient is here for a wellness exam. The patient has been doing well overall without major physical or psychological issues going on lately. The patient needs to address  Chronic OA, joint pains, depression controlled with medical treatment and diet. C/o obesity  Wt Readings from Last 3 Encounters:  11/15/12 307 lb (139.254 kg)  09/06/11 318 lb (144.244 kg)  12/21/10 309 lb (140.161 kg)   BP Readings from Last 3 Encounters:  11/15/12 120/80  09/06/11 118/70  12/21/10 120/70       Review of Systems  Constitutional: Negative for fever, chills, diaphoresis, activity change, appetite change, fatigue and unexpected weight change.  HENT: Negative for hearing loss, ear pain, congestion, sore throat, sneezing, mouth sores, neck pain, dental problem, voice change, postnasal drip and sinus pressure.   Eyes: Negative for pain and visual disturbance.  Respiratory: Negative for cough, chest tightness, wheezing and stridor.   Cardiovascular: Negative for chest pain, palpitations and leg swelling.  Gastrointestinal: Negative for nausea, vomiting, abdominal pain, blood in stool, abdominal distention and rectal pain.  Genitourinary: Negative for dysuria, hematuria, decreased urine volume, vaginal bleeding, vaginal discharge, difficulty urinating, vaginal pain and menstrual problem.  Musculoskeletal: Negative for back pain, joint swelling and gait problem.  Skin: Negative for color change, rash and wound.  Neurological: Negative for dizziness, tremors, syncope, speech difficulty and light-headedness.  Hematological: Negative for adenopathy.  Psychiatric/Behavioral: Negative for suicidal ideas, hallucinations, behavioral problems, confusion, sleep disturbance, self-injury, dysphoric mood and decreased concentration. The patient is nervous/anxious. The patient is not hyperactive.          Objective:   Physical Exam  Constitutional: She appears well-developed. No distress.       Obese  HENT:  Head: Normocephalic.  Right Ear: External ear normal.  Left Ear: External ear normal.  Nose: Nose normal.  Mouth/Throat: Oropharynx is clear and moist.  Eyes: Conjunctivae normal are normal. Pupils are equal, round, and reactive to light. Right eye exhibits no discharge. Left eye exhibits no discharge.  Neck: Normal range of motion. Neck supple. No JVD present. No tracheal deviation present. No thyromegaly present.  Cardiovascular: Normal rate, regular rhythm and normal heart sounds.   Pulmonary/Chest: No stridor. No respiratory distress. She has no wheezes.  Abdominal: Soft. Bowel sounds are normal. She exhibits no distension and no mass. There is no tenderness. There is no rebound and no guarding.  Musculoskeletal: She exhibits no edema and no tenderness.  Lymphadenopathy:    She has no cervical adenopathy.  Neurological: She displays normal reflexes. No cranial nerve deficit. She exhibits normal muscle tone. Coordination normal.  Skin: No rash noted. No erythema.  Psychiatric: She has a normal mood and affect. Her behavior is normal. Judgment and thought content normal.   Labs  12.3.13  Lab Results  Component Value Date   WBC 7.7 08/01/2010   HGB 13.4 08/01/2010   HCT 38.3 08/01/2010   PLT 231.0 08/01/2010   GLUCOSE 85 08/01/2010   CHOL 190 04/14/2010   TRIG 124.0 04/14/2010   HDL 51.20 04/14/2010   LDLDIRECT 145.5 09/12/2007   LDLCALC 114* 04/14/2010   ALT 17 08/01/2010   AST 19 08/01/2010   NA 140 08/01/2010   K 4.3 08/01/2010   CL 108 08/01/2010   CREATININE 0.8 08/01/2010   BUN 9 08/01/2010   CO2 25 08/01/2010   TSH 0.44  09/15/2010   HGBA1C 5.4 09/23/2008         Assessment & Plan:

## 2012-11-15 NOTE — Assessment & Plan Note (Signed)
Belviq to try Diet

## 2012-11-15 NOTE — Assessment & Plan Note (Signed)
Pneumovax 

## 2012-11-15 NOTE — Assessment & Plan Note (Signed)
We discussed age appropriate health related issues, including available/recomended screening tests and vaccinations. We discussed a need for adhering to healthy diet and exercise. Labs/EKG were reviewed/ordered. All questions were answered.  Pneumovax Wt loss

## 2012-12-12 ENCOUNTER — Other Ambulatory Visit: Payer: Self-pay | Admitting: *Deleted

## 2012-12-12 MED ORDER — CYCLOBENZAPRINE HCL 10 MG PO TABS: 10.0000 mg | ORAL_TABLET | Freq: Two times a day (BID) | ORAL | Status: DC | PRN

## 2012-12-12 MED ORDER — CLONAZEPAM 1 MG PO TABS: 1.0000 mg | ORAL_TABLET | Freq: Three times a day (TID) | ORAL | Status: DC | PRN

## 2012-12-16 NOTE — Telephone Encounter (Signed)
Notified pharmacy spoke with Judeth Cornfield gave approval...lmb

## 2012-12-30 ENCOUNTER — Telehealth: Payer: Self-pay | Admitting: *Deleted

## 2012-12-30 NOTE — Telephone Encounter (Signed)
OK to fill this prescription with additional refills x2 Thank you!  

## 2012-12-30 NOTE — Telephone Encounter (Signed)
Rf req for Trazodone 50 mg 2 po qhs. # 180. Last filled 11/29/12. Ok to Rf?

## 2012-12-31 MED ORDER — TRAZODONE HCL 50 MG PO TABS: 100.0000 mg | ORAL_TABLET | Freq: Every day | ORAL | Status: DC

## 2012-12-31 NOTE — Telephone Encounter (Signed)
Done

## 2013-01-15 ENCOUNTER — Other Ambulatory Visit: Payer: Self-pay | Admitting: Internal Medicine

## 2013-01-23 ENCOUNTER — Telehealth: Payer: Self-pay | Admitting: *Deleted

## 2013-01-23 NOTE — Telephone Encounter (Signed)
Completed Belviq PA form faxed to ins.

## 2013-01-27 ENCOUNTER — Telehealth: Payer: Self-pay | Admitting: *Deleted

## 2013-01-27 NOTE — Telephone Encounter (Signed)
Belviq PA approved 01/24/13 until 07/23/13. Pharmacy informed.

## 2013-02-18 ENCOUNTER — Telehealth: Payer: Self-pay | Admitting: *Deleted

## 2013-02-18 MED ORDER — CYCLOBENZAPRINE HCL 10 MG PO TABS: 10.0000 mg | ORAL_TABLET | Freq: Two times a day (BID) | ORAL | Status: DC | PRN

## 2013-02-18 NOTE — Telephone Encounter (Signed)
OK to fill this prescription with additional refills x1 Thank you!  

## 2013-02-18 NOTE — Telephone Encounter (Signed)
Rf req for Cyclobenzaprine 10 mg 1 po bid prn. # 60. Last filled 01/15/13. Ok to Rf?

## 2013-02-18 NOTE — Telephone Encounter (Signed)
Done

## 2013-03-19 ENCOUNTER — Other Ambulatory Visit: Payer: Self-pay | Admitting: Internal Medicine

## 2013-03-21 ENCOUNTER — Encounter: Payer: Self-pay | Admitting: Internal Medicine

## 2013-03-21 ENCOUNTER — Ambulatory Visit (INDEPENDENT_AMBULATORY_CARE_PROVIDER_SITE_OTHER): Payer: BC Managed Care – PPO | Admitting: Internal Medicine

## 2013-03-21 ENCOUNTER — Other Ambulatory Visit: Payer: Self-pay | Admitting: Internal Medicine

## 2013-03-21 VITALS — BP 110/78 | HR 72 | Temp 98.2°F | Resp 16 | Wt 320.0 lb

## 2013-03-21 DIAGNOSIS — Z569 Unspecified problems related to employment: Secondary | ICD-10-CM

## 2013-03-21 DIAGNOSIS — R404 Transient alteration of awareness: Secondary | ICD-10-CM

## 2013-03-21 DIAGNOSIS — E669 Obesity, unspecified: Secondary | ICD-10-CM

## 2013-03-21 DIAGNOSIS — F329 Major depressive disorder, single episode, unspecified: Secondary | ICD-10-CM

## 2013-03-21 DIAGNOSIS — I10 Essential (primary) hypertension: Secondary | ICD-10-CM

## 2013-03-21 DIAGNOSIS — IMO0001 Reserved for inherently not codable concepts without codable children: Secondary | ICD-10-CM

## 2013-03-21 DIAGNOSIS — G4733 Obstructive sleep apnea (adult) (pediatric): Secondary | ICD-10-CM

## 2013-03-21 DIAGNOSIS — Z566 Other physical and mental strain related to work: Secondary | ICD-10-CM | POA: Insufficient documentation

## 2013-03-21 DIAGNOSIS — M797 Fibromyalgia: Secondary | ICD-10-CM

## 2013-03-21 MED ORDER — MELOXICAM 15 MG PO TABS: 15.0000 mg | ORAL_TABLET | Freq: Every day | ORAL | Status: DC | PRN

## 2013-03-21 NOTE — Assessment & Plan Note (Signed)
On CPAP. ?

## 2013-03-21 NOTE — Assessment & Plan Note (Signed)
Discussed.

## 2013-03-21 NOTE — Patient Instructions (Addendum)
[email or web address] 

## 2013-03-21 NOTE — Progress Notes (Signed)
  Subjective:    HPI   The patient needs to address  Chronic OA, joint pains-worse, depression controlled with medical treatment and diet. C/o obesity an wt gain. C/o fatigue  Wt Readings from Last 3 Encounters:  03/21/13 320 lb (145.151 kg)  11/15/12 307 lb (139.254 kg)  09/06/11 318 lb (144.244 kg)   BP Readings from Last 3 Encounters:  03/21/13 110/78  11/15/12 120/80  09/06/11 118/70       Review of Systems  Constitutional: Negative for fever, chills, diaphoresis, activity change, appetite change, fatigue and unexpected weight change.  HENT: Negative for hearing loss, ear pain, congestion, sore throat, sneezing, mouth sores, neck pain, dental problem, voice change, postnasal drip and sinus pressure.   Eyes: Negative for pain and visual disturbance.  Respiratory: Negative for cough, chest tightness, wheezing and stridor.   Cardiovascular: Negative for chest pain, palpitations and leg swelling.  Gastrointestinal: Negative for nausea, vomiting, abdominal pain, blood in stool, abdominal distention and rectal pain.  Genitourinary: Negative for dysuria, hematuria, decreased urine volume, vaginal bleeding, vaginal discharge, difficulty urinating, vaginal pain and menstrual problem.  Musculoskeletal: Negative for back pain, joint swelling and gait problem.  Skin: Negative for color change, rash and wound.  Neurological: Negative for dizziness, tremors, syncope, speech difficulty and light-headedness.  Hematological: Negative for adenopathy.  Psychiatric/Behavioral: Negative for suicidal ideas, hallucinations, behavioral problems, confusion, sleep disturbance, self-injury, dysphoric mood and decreased concentration. The patient is nervous/anxious. The patient is not hyperactive.        Objective:   Physical Exam  Constitutional: She appears well-developed. No distress.  Obese  HENT:  Head: Normocephalic.  Right Ear: External ear normal.  Left Ear: External ear normal.  Nose:  Nose normal.  Mouth/Throat: Oropharynx is clear and moist.  Eyes: Conjunctivae are normal. Pupils are equal, round, and reactive to light. Right eye exhibits no discharge. Left eye exhibits no discharge.  Neck: Normal range of motion. Neck supple. No JVD present. No tracheal deviation present. No thyromegaly present.  Cardiovascular: Normal rate, regular rhythm and normal heart sounds.   Pulmonary/Chest: No stridor. No respiratory distress. She has no wheezes.  Abdominal: Soft. Bowel sounds are normal. She exhibits no distension and no mass. There is no tenderness. There is no rebound and no guarding.  Musculoskeletal: She exhibits no edema and no tenderness.  Lymphadenopathy:    She has no cervical adenopathy.  Neurological: She displays normal reflexes. No cranial nerve deficit. She exhibits normal muscle tone. Coordination normal.  Skin: No rash noted. No erythema.  Psychiatric: She has a normal mood and affect. Her behavior is normal. Judgment and thought content normal.   Labs  12.3.13  Lab Results  Component Value Date   WBC 7.7 08/01/2010   HGB 13.4 08/01/2010   HCT 38.3 08/01/2010   PLT 231.0 08/01/2010   GLUCOSE 85 08/01/2010   CHOL 190 04/14/2010   TRIG 124.0 04/14/2010   HDL 51.20 04/14/2010   LDLDIRECT 145.5 09/12/2007   LDLCALC 114* 04/14/2010   ALT 17 08/01/2010   AST 19 08/01/2010   NA 140 08/01/2010   K 4.3 08/01/2010   CL 108 08/01/2010   CREATININE 0.8 08/01/2010   BUN 9 08/01/2010   CO2 25 08/01/2010   TSH 0.44 09/15/2010   HGBA1C 5.4 09/23/2008         Assessment & Plan:

## 2013-03-21 NOTE — Assessment & Plan Note (Signed)
  On diet  

## 2013-03-21 NOTE — Assessment & Plan Note (Signed)
Worse - discussed 

## 2013-03-21 NOTE — Assessment & Plan Note (Signed)
Wt Readings from Last 3 Encounters:  03/21/13 320 lb (145.151 kg)  11/15/12 307 lb (139.254 kg)  09/06/11 318 lb (144.244 kg)

## 2013-05-19 ENCOUNTER — Telehealth: Payer: Self-pay | Admitting: Internal Medicine

## 2013-05-19 NOTE — Telephone Encounter (Signed)
OK to fill this prescription #60 with additional refills x1 Thank you!

## 2013-05-19 NOTE — Telephone Encounter (Signed)
Pt states that [facility] on [address] [phone]) faxed [place] a refill request on Flexeril generic last week.

## 2013-05-19 NOTE — Telephone Encounter (Signed)
I just received faxed Rf request with today's date on it. Please advise on cyclobenzaprine 10 mg 1 po bid prn. 3 60. Ok to Rf?

## 2013-05-20 MED ORDER — CYCLOBENZAPRINE HCL 10 MG PO TABS: 10.0000 mg | ORAL_TABLET | Freq: Two times a day (BID) | ORAL | Status: DC | PRN

## 2013-05-20 NOTE — Telephone Encounter (Signed)
Done

## 2013-05-23 ENCOUNTER — Ambulatory Visit (HOSPITAL_COMMUNITY)
Admission: RE | Admit: 2013-05-23 | Discharge: 2013-05-23 | Disposition: A | Discharge status: Home / Self Care | Payer: BC Managed Care – PPO | Source: Ambulatory Visit | Attending: Obstetrics | Admitting: Obstetrics

## 2013-05-23 ENCOUNTER — Telehealth: Payer: Self-pay | Admitting: *Deleted

## 2013-05-23 ENCOUNTER — Ambulatory Visit (INDEPENDENT_AMBULATORY_CARE_PROVIDER_SITE_OTHER): Payer: BC Managed Care – PPO | Admitting: Obstetrics

## 2013-05-23 ENCOUNTER — Encounter: Payer: Self-pay | Admitting: Obstetrics

## 2013-05-23 VITALS — BP 142/84 | HR 87 | Temp 98.4°F | Wt 316.0 lb

## 2013-05-23 DIAGNOSIS — F329 Major depressive disorder, single episode, unspecified: Secondary | ICD-10-CM

## 2013-05-23 DIAGNOSIS — Z3202 Encounter for pregnancy test, result negative: Secondary | ICD-10-CM

## 2013-05-23 DIAGNOSIS — N943 Premenstrual tension syndrome: Secondary | ICD-10-CM

## 2013-05-23 DIAGNOSIS — N949 Unspecified condition associated with female genital organs and menstrual cycle: Secondary | ICD-10-CM | POA: Insufficient documentation

## 2013-05-23 DIAGNOSIS — D251 Intramural leiomyoma of uterus: Secondary | ICD-10-CM | POA: Insufficient documentation

## 2013-05-23 DIAGNOSIS — IMO0001 Reserved for inherently not codable concepts without codable children: Secondary | ICD-10-CM

## 2013-05-23 DIAGNOSIS — M797 Fibromyalgia: Secondary | ICD-10-CM

## 2013-05-23 DIAGNOSIS — N390 Urinary tract infection, site not specified: Secondary | ICD-10-CM

## 2013-05-23 DIAGNOSIS — Z113 Encounter for screening for infections with a predominantly sexual mode of transmission: Secondary | ICD-10-CM

## 2013-05-23 DIAGNOSIS — N760 Acute vaginitis: Secondary | ICD-10-CM

## 2013-05-23 DIAGNOSIS — R319 Hematuria, unspecified: Secondary | ICD-10-CM

## 2013-05-23 NOTE — Progress Notes (Signed)
.   Subjective:     Sylvia Alvarado is a 42 y.o. female here for a problem exam.  Current complaints:Pelvic pain and cramping since the middle of June.  Spotting since 05/20/13 - on and off.  Personal health questionnaire reviewed: yes.   Gynecologic History Patient's last menstrual period was 05/20/2013. Contraception: tubal ligation Last Pap: 10/2012. Results were: normal Last mammogram: age 15. Results were: normal  Obstetric History OB History   Grav Para Term Preterm Abortions TAB SAB Ect Mult Living                   The following portions of the patient's history were reviewed and updated as appropriate: allergies, current medications, past family history, past medical history, past social history, past surgical history and problem list.  Review of Systems Pertinent items are noted in HPI.    Objective:    General appearance: alert and no distress Abdomen: normal findings: soft, non-tender and obese Pelvic: cervix normal in appearance, exam obscured by obesity, external genitalia normal, no adnexal masses or tenderness, no cervical motion tenderness, uterus normal size, shape, and consistency and vagina normal without discharge    Assessment:    Pelvic pain.  PMS   Plan:    Education reviewed: Causes of pelvic pain.. Follow up in: 2 weeks. Ultrasound ordered.   Will discuss management of PMS with Patient's PMD, Dr. Juanita Laster.  I feel that Prozac may be helpful, but she is also on other antidepressants.

## 2013-05-23 NOTE — Telephone Encounter (Signed)
Pt states she Dr. Clearance Coots, GYN and he dx with severe PMS. She states he suggested adding Prozac to her current meds and to check with you if you agreed.

## 2013-05-24 LAB — URINE CULTURE: Colony Count: NO GROWTH

## 2013-05-24 LAB — GC/CHLAMYDIA PROBE AMP
CT Probe RNA: NEGATIVE
GC Probe RNA: NEGATIVE

## 2013-05-24 NOTE — Telephone Encounter (Signed)
I wouldn't add it - already taking several mood altering meds Thx

## 2013-05-26 ENCOUNTER — Telehealth: Payer: Self-pay | Admitting: *Deleted

## 2013-05-26 NOTE — Telephone Encounter (Signed)
Pt informed

## 2013-05-26 NOTE — Telephone Encounter (Signed)
It is more complicated. Agree w/OV Thx

## 2013-05-26 NOTE — Telephone Encounter (Signed)
Pt has been reading a lot and wants to know if you think she has PMDD. She c/o depression and at times feeling like she wants to "crawl under a rock" and wants to know if you think there could be a correlation. I advised her you wanted her back in August. I advised her to make an OV to discuss this further.

## 2013-05-28 NOTE — Telephone Encounter (Signed)
Pt informed and transferred to scheduler.

## 2013-06-05 ENCOUNTER — Ambulatory Visit: Payer: BC Managed Care – PPO | Admitting: Obstetrics

## 2013-06-20 ENCOUNTER — Other Ambulatory Visit: Payer: Self-pay | Admitting: Internal Medicine

## 2013-06-23 ENCOUNTER — Ambulatory Visit (INDEPENDENT_AMBULATORY_CARE_PROVIDER_SITE_OTHER): Payer: BC Managed Care – PPO | Admitting: Internal Medicine

## 2013-06-23 ENCOUNTER — Ambulatory Visit (HOSPITAL_COMMUNITY)
Admission: RE | Admit: 2013-06-23 | Discharge: 2013-06-23 | Disposition: A | Discharge status: Home / Self Care | Payer: BC Managed Care – PPO | Source: Ambulatory Visit | Attending: Internal Medicine | Admitting: Internal Medicine

## 2013-06-23 ENCOUNTER — Encounter: Payer: Self-pay | Admitting: Internal Medicine

## 2013-06-23 VITALS — BP 118/80 | HR 80 | Temp 98.0°F | Resp 16 | Wt 322.0 lb

## 2013-06-23 DIAGNOSIS — I10 Essential (primary) hypertension: Secondary | ICD-10-CM

## 2013-06-23 DIAGNOSIS — J45909 Unspecified asthma, uncomplicated: Secondary | ICD-10-CM

## 2013-06-23 DIAGNOSIS — M797 Fibromyalgia: Secondary | ICD-10-CM

## 2013-06-23 DIAGNOSIS — F329 Major depressive disorder, single episode, unspecified: Secondary | ICD-10-CM

## 2013-06-23 DIAGNOSIS — M47817 Spondylosis without myelopathy or radiculopathy, lumbosacral region: Secondary | ICD-10-CM | POA: Insufficient documentation

## 2013-06-23 DIAGNOSIS — M545 Low back pain, unspecified: Secondary | ICD-10-CM

## 2013-06-23 DIAGNOSIS — E669 Obesity, unspecified: Secondary | ICD-10-CM

## 2013-06-23 DIAGNOSIS — F3289 Other specified depressive episodes: Secondary | ICD-10-CM

## 2013-06-23 DIAGNOSIS — IMO0001 Reserved for inherently not codable concepts without codable children: Secondary | ICD-10-CM

## 2013-06-23 MED ORDER — LORCASERIN HCL 10 MG PO TABS: 1.0000 | ORAL_TABLET | Freq: Two times a day (BID) | ORAL | Status: DC

## 2013-06-23 MED ORDER — BUPRENORPHINE 10 MCG/HR TD PTWK: 10.0000 ug | MEDICATED_PATCH | TRANSDERMAL | Status: DC

## 2013-06-23 NOTE — Assessment & Plan Note (Signed)
Trial of Butrans LS xray

## 2013-06-23 NOTE — Telephone Encounter (Signed)
Refill request for Belviq Last filled by MD on - 11/25/12 #60 x5 Last seen- 05/23/13 F/U appt: 06/23/13 Please advise refill?

## 2013-06-23 NOTE — Assessment & Plan Note (Signed)
Gluten free trial (no wheat products) x4-6 weeks. OK to use Gluten-free bread and pasta. Milk free trial (no milk, ice cream and yogurt) x4 weeks. OK to use almond or soy milk. 

## 2013-06-23 NOTE — Patient Instructions (Signed)
Gluten free trial (no wheat products) x4-6 weeks. OK to use Gluten-free bread and pasta.  Milk free trial (no milk, ice cream and yogurt) x4 weeks. OK to use almond or soy milk.    [email or web address]

## 2013-06-23 NOTE — Assessment & Plan Note (Signed)
Continue with current prescription therapy as reflected on the Med list.  

## 2013-06-23 NOTE — Assessment & Plan Note (Signed)
Discussed.

## 2013-06-23 NOTE — Progress Notes (Signed)
Subjective:    Back Pain This is a chronic problem. The current episode started more than 1 year ago. The problem occurs intermittently. The problem has been gradually worsening since onset. The pain is present in the lumbar spine. The quality of the pain is described as burning. The pain radiates to the right thigh (R groin). The pain is severe. The pain is worse during the day. The symptoms are aggravated by bending, lying down and standing. Pertinent negatives include no abdominal pain, chest pain, dysuria or fever. She has tried analgesics for the symptoms. The treatment provided mild relief.    C/o LBP F/u depression Saw her GYN, had an [place] The patient needs to address  Chronic OA, joint pains-worse, depression controlled with medical treatment and diet. C/o obesity an wt gain. C/o fatigue  Wt Readings from Last 3 Encounters:  06/23/13 322 lb (146.058 kg)  05/23/13 316 lb (143.337 kg)  03/21/13 320 lb (145.151 kg)   BP Readings from Last 3 Encounters:  06/23/13 118/80  05/23/13 142/84  03/21/13 110/78       Review of Systems  Constitutional: Negative for fever, chills, diaphoresis, activity change, appetite change, fatigue and unexpected weight change.  HENT: Negative for hearing loss, ear pain, congestion, sore throat, sneezing, mouth sores, neck pain, dental problem, voice change, postnasal drip and sinus pressure.   Eyes: Negative for pain and visual disturbance.  Respiratory: Negative for cough, chest tightness, wheezing and stridor.   Cardiovascular: Negative for chest pain, palpitations and leg swelling.  Gastrointestinal: Negative for nausea, vomiting, abdominal pain, blood in stool, abdominal distention and rectal pain.  Genitourinary: Negative for dysuria, hematuria, decreased urine volume, vaginal bleeding, vaginal discharge, difficulty urinating, vaginal pain and menstrual problem.  Musculoskeletal: Negative for back pain, joint swelling and gait problem.  Skin:  Negative for color change, rash and wound.  Neurological: Negative for dizziness, tremors, syncope, speech difficulty and light-headedness.  Hematological: Negative for adenopathy.  Psychiatric/Behavioral: Negative for suicidal ideas, hallucinations, behavioral problems, confusion, sleep disturbance, self-injury, dysphoric mood and decreased concentration. The patient is nervous/anxious. The patient is not hyperactive.        Objective:   Physical Exam  Constitutional: She appears well-developed. No distress.  Obese  HENT:  Head: Normocephalic.  Right Ear: External ear normal.  Left Ear: External ear normal.  Nose: Nose normal.  Mouth/Throat: Oropharynx is clear and moist.  Eyes: Conjunctivae are normal. Pupils are equal, round, and reactive to light. Right eye exhibits no discharge. Left eye exhibits no discharge.  Neck: Normal range of motion. Neck supple. No JVD present. No tracheal deviation present. No thyromegaly present.  Cardiovascular: Normal rate, regular rhythm and normal heart sounds.   Pulmonary/Chest: No stridor. No respiratory distress. She has no wheezes.  Abdominal: Soft. Bowel sounds are normal. She exhibits no distension and no mass. There is no tenderness. There is no rebound and no guarding.  Musculoskeletal: She exhibits no edema and no tenderness.  Lymphadenopathy:    She has no cervical adenopathy.  Neurological: She displays normal reflexes. No cranial nerve deficit. She exhibits normal muscle tone. Coordination normal.  Skin: No rash noted. No erythema.  Psychiatric: She has a normal mood and affect. Her behavior is normal. Judgment and thought content normal.   Labs  12.3.13  Lab Results  Component Value Date   WBC 7.7 08/01/2010   HGB 13.4 08/01/2010   HCT 38.3 08/01/2010   PLT 231.0 08/01/2010   GLUCOSE 85 08/01/2010   CHOL 190  04/14/2010   TRIG 124.0 04/14/2010   HDL 51.20 04/14/2010   LDLDIRECT 145.5 09/12/2007   LDLCALC 114* 04/14/2010   ALT 17 08/01/2010    AST 19 08/01/2010   NA 140 08/01/2010   K 4.3 08/01/2010   CL 108 08/01/2010   CREATININE 0.8 08/01/2010   BUN 9 08/01/2010   CO2 25 08/01/2010   TSH 0.44 09/15/2010   HGBA1C 5.4 09/23/2008         Assessment & Plan:

## 2013-06-23 NOTE — Assessment & Plan Note (Addendum)
Chronic, morbid Lap band suggested Belviq trial per pt's request

## 2013-06-24 ENCOUNTER — Encounter: Payer: Self-pay | Admitting: Internal Medicine

## 2013-06-24 ENCOUNTER — Telehealth: Payer: Self-pay | Admitting: *Deleted

## 2013-06-24 NOTE — Telephone Encounter (Signed)
Spoke with pt advised her of result note.

## 2013-07-11 ENCOUNTER — Other Ambulatory Visit: Payer: Self-pay | Admitting: Internal Medicine

## 2013-07-11 ENCOUNTER — Encounter: Payer: Self-pay | Admitting: Internal Medicine

## 2013-07-11 NOTE — Telephone Encounter (Signed)
Pt is requesting a refill for tramadol 50mg . Med is not listed on current med list. OK to refill? Last OV 5.9.14

## 2013-07-15 ENCOUNTER — Other Ambulatory Visit: Payer: Self-pay | Admitting: Internal Medicine

## 2013-07-16 ENCOUNTER — Telehealth: Payer: Self-pay | Admitting: *Deleted

## 2013-07-16 NOTE — Telephone Encounter (Signed)
OK to fill this prescription with additional refills x1 Thank you!  

## 2013-07-16 NOTE — Telephone Encounter (Signed)
Rf req for Clonazepam 1 mg 1 po tid prn. # 270. Ok to rf?

## 2013-07-17 MED ORDER — CLONAZEPAM 1 MG PO TABS: 1.0000 mg | ORAL_TABLET | Freq: Three times a day (TID) | ORAL | Status: DC | PRN

## 2013-07-17 NOTE — Telephone Encounter (Signed)
Done

## 2013-07-23 ENCOUNTER — Telehealth: Payer: Self-pay | Admitting: Internal Medicine

## 2013-07-23 NOTE — Telephone Encounter (Signed)
Pt sent a msg through My Chart, copied it and faxed it to Dr. Posey Rea.  The Lavera Guise is not working and causing side effects.  She wants to know what to do.

## 2013-07-24 NOTE — Telephone Encounter (Signed)
Pt informed.  She made an appt to discuss options on Friday Sept 12.

## 2013-07-24 NOTE — Telephone Encounter (Signed)
D/c Butrans Keep ROV Thx

## 2013-07-25 ENCOUNTER — Ambulatory Visit (INDEPENDENT_AMBULATORY_CARE_PROVIDER_SITE_OTHER): Payer: BC Managed Care – PPO | Admitting: Internal Medicine

## 2013-07-25 ENCOUNTER — Encounter: Payer: Self-pay | Admitting: Internal Medicine

## 2013-07-25 VITALS — BP 120/80 | HR 96 | Temp 97.9°F | Resp 16 | Wt 324.0 lb

## 2013-07-25 DIAGNOSIS — Z23 Encounter for immunization: Secondary | ICD-10-CM

## 2013-07-25 DIAGNOSIS — M545 Low back pain, unspecified: Secondary | ICD-10-CM

## 2013-07-25 DIAGNOSIS — F329 Major depressive disorder, single episode, unspecified: Secondary | ICD-10-CM

## 2013-07-25 DIAGNOSIS — J45909 Unspecified asthma, uncomplicated: Secondary | ICD-10-CM

## 2013-07-25 DIAGNOSIS — Z566 Other physical and mental strain related to work: Secondary | ICD-10-CM

## 2013-07-25 DIAGNOSIS — Z9989 Dependence on other enabling machines and devices: Secondary | ICD-10-CM

## 2013-07-25 DIAGNOSIS — I10 Essential (primary) hypertension: Secondary | ICD-10-CM

## 2013-07-25 DIAGNOSIS — E669 Obesity, unspecified: Secondary | ICD-10-CM

## 2013-07-25 DIAGNOSIS — Z569 Unspecified problems related to employment: Secondary | ICD-10-CM

## 2013-07-25 DIAGNOSIS — G4733 Obstructive sleep apnea (adult) (pediatric): Secondary | ICD-10-CM

## 2013-07-25 MED ORDER — METHYLPREDNISOLONE ACETATE 80 MG/ML IJ SUSP
120.0000 mg | Freq: Once | INTRAMUSCULAR | Status: AC
  Administered 2013-07-25: 120 mg via INTRAMUSCULAR

## 2013-07-25 MED ORDER — FENTANYL 25 MCG/HR TD PT72: 1.0000 | MEDICATED_PATCH | TRANSDERMAL | Status: DC

## 2013-07-25 NOTE — Progress Notes (Signed)
Subjective:    Back Pain This is a chronic problem. The current episode started more than 1 year ago. The problem occurs intermittently. The problem has been gradually worsening since onset. The pain is present in the lumbar spine. The quality of the pain is described as burning. The pain radiates to the right thigh (R groin). The pain is severe. The pain is worse during the day. The symptoms are aggravated by bending, lying down and standing. Pertinent negatives include no abdominal pain, chest pain, dysuria or fever. She has tried analgesics for the symptoms. The treatment provided mild relief.    C/o LBP - not better - butrans caused side effects F/u depression Saw her GYN, had an [place] The patient needs to address  Chronic OA, joint pains-worse, depression controlled with medical treatment and diet. C/o obesity an wt gain. C/o fatigue  Wt Readings from Last 3 Encounters:  07/25/13 324 lb (146.965 kg)  06/23/13 322 lb (146.058 kg)  05/23/13 316 lb (143.337 kg)   BP Readings from Last 3 Encounters:  07/25/13 120/80  06/23/13 118/80  05/23/13 142/84       Review of Systems  Constitutional: Negative for fever, chills, diaphoresis, activity change, appetite change, fatigue and unexpected weight change.  HENT: Negative for hearing loss, ear pain, congestion, sore throat, sneezing, mouth sores, neck pain, dental problem, voice change, postnasal drip and sinus pressure.   Eyes: Negative for pain and visual disturbance.  Respiratory: Negative for cough, chest tightness, wheezing and stridor.   Cardiovascular: Negative for chest pain, palpitations and leg swelling.  Gastrointestinal: Negative for nausea, vomiting, abdominal pain, blood in stool, abdominal distention and rectal pain.  Genitourinary: Negative for dysuria, hematuria, decreased urine volume, vaginal bleeding, vaginal discharge, difficulty urinating, vaginal pain and menstrual problem.  Musculoskeletal: Negative for back pain,  joint swelling and gait problem.  Skin: Negative for color change, rash and wound.  Neurological: Negative for dizziness, tremors, syncope, speech difficulty and light-headedness.  Hematological: Negative for adenopathy.  Psychiatric/Behavioral: Negative for suicidal ideas, hallucinations, behavioral problems, confusion, sleep disturbance, self-injury, dysphoric mood and decreased concentration. The patient is nervous/anxious. The patient is not hyperactive.        Objective:   Physical Exam  Constitutional: She appears well-developed. No distress.  Obese  HENT:  Head: Normocephalic.  Right Ear: External ear normal.  Left Ear: External ear normal.  Nose: Nose normal.  Mouth/Throat: Oropharynx is clear and moist.  Eyes: Conjunctivae are normal. Pupils are equal, round, and reactive to light. Right eye exhibits no discharge. Left eye exhibits no discharge.  Neck: Normal range of motion. Neck supple. No JVD present. No tracheal deviation present. No thyromegaly present.  Cardiovascular: Normal rate, regular rhythm and normal heart sounds.   Pulmonary/Chest: No stridor. No respiratory distress. She has no wheezes.  Abdominal: Soft. Bowel sounds are normal. She exhibits no distension and no mass. There is no tenderness. There is no rebound and no guarding.  Musculoskeletal: She exhibits no edema and no tenderness.  Lymphadenopathy:    She has no cervical adenopathy.  Neurological: She displays normal reflexes. No cranial nerve deficit. She exhibits normal muscle tone. Coordination normal.  Skin: No rash noted. No erythema.  Psychiatric: She has a normal mood and affect. Her behavior is normal. Judgment and thought content normal.   Labs  12.3.13  Lab Results  Component Value Date   WBC 7.7 08/01/2010   HGB 13.4 08/01/2010   HCT 38.3 08/01/2010   PLT 231.0 08/01/2010  GLUCOSE 85 08/01/2010   CHOL 190 04/14/2010   TRIG 124.0 04/14/2010   HDL 51.20 04/14/2010   LDLDIRECT 145.5 09/12/2007    LDLCALC 114* 04/14/2010   ALT 17 08/01/2010   AST 19 08/01/2010   NA 140 08/01/2010   K 4.3 08/01/2010   CL 108 08/01/2010   CREATININE 0.8 08/01/2010   BUN 9 08/01/2010   CO2 25 08/01/2010   TSH 0.44 09/15/2010   HGBA1C 5.4 09/23/2008         Assessment & Plan:

## 2013-08-02 ENCOUNTER — Encounter: Payer: Self-pay | Admitting: Internal Medicine

## 2013-08-02 NOTE — Assessment & Plan Note (Signed)
Discussed.

## 2013-08-02 NOTE — Assessment & Plan Note (Signed)
Continue with current prescription therapy as reflected on the Med list.  

## 2013-08-02 NOTE — Assessment & Plan Note (Signed)
Wt Readings from Last 3 Encounters:  07/25/13 324 lb (146.965 kg)  06/23/13 322 lb (146.058 kg)  05/23/13 316 lb (143.337 kg)

## 2013-08-02 NOTE — Assessment & Plan Note (Signed)
Will try Duragesic

## 2013-08-02 NOTE — Assessment & Plan Note (Signed)
Chronic CPAP

## 2013-08-18 ENCOUNTER — Other Ambulatory Visit: Payer: Self-pay | Admitting: Internal Medicine

## 2013-09-09 ENCOUNTER — Telehealth: Payer: Self-pay | Admitting: Internal Medicine

## 2013-09-09 NOTE — Telephone Encounter (Signed)
09/09/2013   Kim from [employer] called in regards to weights received on pt before and after initial Belviq dose.  Please contact Kim as soon as possible.  Her contact # [phone].

## 2013-09-09 NOTE — Telephone Encounter (Signed)
I called Kim and informed her of correct weights for the months under review. She will try to get intial trial approved, but based her records patient was not filling Belviq regularly. Pt needs to fill regularly and take as prescribed in order for PA to be approved again in 6 months.

## 2013-09-15 ENCOUNTER — Other Ambulatory Visit: Payer: Self-pay | Admitting: Internal Medicine

## 2013-09-22 ENCOUNTER — Telehealth: Payer: Self-pay | Admitting: *Deleted

## 2013-09-22 ENCOUNTER — Other Ambulatory Visit: Payer: Self-pay | Admitting: Internal Medicine

## 2013-09-22 MED ORDER — FENTANYL 25 MCG/HR TD PT72: 25.0000 ug | MEDICATED_PATCH | TRANSDERMAL | Status: DC

## 2013-09-22 NOTE — Telephone Encounter (Signed)
Rx ready for p/u.

## 2013-09-22 NOTE — Telephone Encounter (Signed)
OK to fill this prescription with additional refills x0 Thank you!  

## 2013-09-22 NOTE — Telephone Encounter (Signed)
Rf req for Fentanyl 25mg /hr patches. Ok to Rf?

## 2013-10-13 ENCOUNTER — Other Ambulatory Visit: Payer: Self-pay | Admitting: Internal Medicine

## 2013-10-16 ENCOUNTER — Encounter: Payer: Self-pay | Admitting: Internal Medicine

## 2013-10-20 ENCOUNTER — Other Ambulatory Visit: Payer: Self-pay | Admitting: *Deleted

## 2013-10-20 MED ORDER — CYCLOBENZAPRINE HCL 10 MG PO TABS: 10.0000 mg | ORAL_TABLET | Freq: Two times a day (BID) | ORAL | Status: DC | PRN

## 2013-10-28 ENCOUNTER — Other Ambulatory Visit: Payer: Self-pay | Admitting: Internal Medicine

## 2013-11-04 MED ORDER — TRAMADOL HCL 50 MG PO TABS: ORAL_TABLET | ORAL | Status: DC

## 2014-01-05 ENCOUNTER — Telehealth: Payer: Self-pay | Admitting: *Deleted

## 2014-01-05 NOTE — Telephone Encounter (Signed)
Sylvia Alvarado, please call pt. Reschedule OV sooner per MD.

## 2014-01-05 NOTE — Telephone Encounter (Signed)
Ok to move up her appt Thx

## 2014-01-05 NOTE — Telephone Encounter (Signed)
Pt states she has OV scheduled in March. She mentioned a small bump on back of her head measuring 1.5'' in diameter. She states it has been there for several years but has recently grew larger.   She c/o hot flashes all the time, not just on her face, but all over. She denies fever and states her TSH and FSH are both normal.   Also, she c/o her hair in front is thin and almost balding and yet she has more coarse hair growing under her chin.

## 2014-01-12 NOTE — Telephone Encounter (Signed)
Appt changed to March 4.

## 2014-01-14 ENCOUNTER — Encounter: Payer: Self-pay | Admitting: Internal Medicine

## 2014-01-14 ENCOUNTER — Other Ambulatory Visit (INDEPENDENT_AMBULATORY_CARE_PROVIDER_SITE_OTHER): Payer: BC Managed Care – PPO

## 2014-01-14 ENCOUNTER — Ambulatory Visit (INDEPENDENT_AMBULATORY_CARE_PROVIDER_SITE_OTHER): Payer: BC Managed Care – PPO | Admitting: Internal Medicine

## 2014-01-14 VITALS — BP 120/88 | HR 80 | Temp 97.6°F | Resp 16 | Ht 67.0 in | Wt 327.0 lb

## 2014-01-14 DIAGNOSIS — I10 Essential (primary) hypertension: Secondary | ICD-10-CM

## 2014-01-14 DIAGNOSIS — Z0000 Encounter for general adult medical examination without abnormal findings: Secondary | ICD-10-CM

## 2014-01-14 DIAGNOSIS — J45909 Unspecified asthma, uncomplicated: Secondary | ICD-10-CM

## 2014-01-14 DIAGNOSIS — E669 Obesity, unspecified: Secondary | ICD-10-CM

## 2014-01-14 DIAGNOSIS — F3289 Other specified depressive episodes: Secondary | ICD-10-CM

## 2014-01-14 DIAGNOSIS — M545 Low back pain, unspecified: Secondary | ICD-10-CM

## 2014-01-14 DIAGNOSIS — F329 Major depressive disorder, single episode, unspecified: Secondary | ICD-10-CM

## 2014-01-14 LAB — BASIC METABOLIC PANEL
BUN: 8 mg/dL (ref 6–23)
CHLORIDE: 102 meq/L (ref 96–112)
CO2: 27 mEq/L (ref 19–32)
CREATININE: 0.8 mg/dL (ref 0.4–1.2)
Calcium: 9.3 mg/dL (ref 8.4–10.5)
GFR: 89.38 mL/min (ref >60.00)
Glucose, Bld: 89 mg/dL (ref 70–99)
POTASSIUM: 4.2 meq/L (ref 3.5–5.1)
SODIUM: 137 meq/L (ref 135–145)

## 2014-01-14 LAB — CBC WITH DIFFERENTIAL/PLATELET
BASOS PCT: 0.6 % (ref 0.0–3.0)
Basophils Absolute: 0 10*3/uL (ref 0.0–0.1)
EOS PCT: 1.7 % (ref 0.0–5.0)
Eosinophils Absolute: 0.1 10*3/uL (ref 0.0–0.7)
HCT: 41.6 % (ref 36.0–46.0)
HEMOGLOBIN: 14.1 g/dL (ref 12.0–15.0)
Lymphocytes Relative: 36.5 % (ref 12.0–46.0)
Lymphs Abs: 2.8 10*3/uL (ref 0.7–4.0)
MCHC: 34 g/dL (ref 30.0–36.0)
MCV: 88.3 fl (ref 78.0–100.0)
MONO ABS: 0.6 10*3/uL (ref 0.1–1.0)
Monocytes Relative: 7.6 % (ref 3.0–12.0)
NEUTROS ABS: 4.1 10*3/uL (ref 1.4–7.7)
NEUTROS PCT: 53.6 % (ref 43.0–77.0)
Platelets: 236 10*3/uL (ref 150.0–400.0)
RBC: 4.71 Mil/uL (ref 3.87–5.11)
RDW: 14 % (ref 11.5–14.6)
WBC: 7.6 10*3/uL (ref 4.5–10.5)

## 2014-01-14 LAB — URINALYSIS
BILIRUBIN URINE: NEGATIVE
Hgb urine dipstick: NEGATIVE
Ketones, ur: NEGATIVE
Leukocytes, UA: NEGATIVE
NITRITE: NEGATIVE
PH: 7.5 (ref 5.0–8.0)
Specific Gravity, Urine: 1.015 (ref 1.000–1.030)
TOTAL PROTEIN, URINE-UPE24: NEGATIVE
Urine Glucose: NEGATIVE
Urobilinogen, UA: 0.2 (ref 0.0–1.0)

## 2014-01-14 LAB — LIPID PANEL
Cholesterol: 188 mg/dL (ref 0–200)
HDL: 55.1 mg/dL (ref >39.00)
LDL CALC: 101 mg/dL — AB (ref 0–99)
Total CHOL/HDL Ratio: 3
Triglycerides: 158 mg/dL — ABNORMAL HIGH (ref 0.0–149.0)
VLDL: 31.6 mg/dL (ref 0.0–40.0)

## 2014-01-14 LAB — HEPATIC FUNCTION PANEL
ALK PHOS: 53 U/L (ref 39–117)
ALT: 42 U/L — ABNORMAL HIGH (ref 0–35)
AST: 48 U/L — AB (ref 0–37)
Albumin: 3.7 g/dL (ref 3.5–5.2)
BILIRUBIN TOTAL: 0.7 mg/dL (ref 0.3–1.2)
Bilirubin, Direct: 0.1 mg/dL (ref 0.0–0.3)
Total Protein: 7.1 g/dL (ref 6.0–8.3)

## 2014-01-14 LAB — SEDIMENTATION RATE: SED RATE: 14 mm/h (ref 0–22)

## 2014-01-14 LAB — TSH: TSH: 0.87 u[IU]/mL (ref 0.35–5.50)

## 2014-01-14 LAB — FOLLICLE STIMULATING HORMONE: FSH: 4.4 m[IU]/mL

## 2014-01-14 LAB — T4, FREE: FREE T4: 0.83 ng/dL (ref 0.60–1.60)

## 2014-01-14 LAB — HEMOGLOBIN A1C: Hgb A1c MFr Bld: 6.2 % (ref 4.6–6.5)

## 2014-01-14 MED ORDER — METRONIDAZOLE 0.75 % EX GEL: 1.0000 "application " | Freq: Every day | CUTANEOUS | Status: DC

## 2014-01-14 MED ORDER — FENTANYL 25 MCG/HR TD PT72: 25.0000 ug | MEDICATED_PATCH | TRANSDERMAL | Status: DC

## 2014-01-14 MED ORDER — CYCLOBENZAPRINE HCL 10 MG PO TABS: 10.0000 mg | ORAL_TABLET | Freq: Two times a day (BID) | ORAL | Status: DC | PRN

## 2014-01-14 MED ORDER — LORCASERIN HCL 10 MG PO TABS: 1.0000 | ORAL_TABLET | Freq: Two times a day (BID) | ORAL | Status: DC

## 2014-01-14 MED ORDER — CLONAZEPAM 1 MG PO TABS: 1.0000 mg | ORAL_TABLET | Freq: Three times a day (TID) | ORAL | Status: DC | PRN

## 2014-01-14 MED ORDER — TRAZODONE HCL 50 MG PO TABS: ORAL_TABLET | ORAL | Status: DC

## 2014-01-14 NOTE — Assessment & Plan Note (Signed)
Continue with current prn prescription therapy as reflected on the Med list.  

## 2014-01-14 NOTE — Assessment & Plan Note (Signed)
We discussed age appropriate health related issues, including available/recomended screening tests and vaccinations. We discussed a need for adhering to healthy diet and exercise. Labs/EKG were reviewed/ordered. All questions were answered.  Labs 

## 2014-01-14 NOTE — Progress Notes (Signed)
Pre visit review using our clinic review tool, if applicable. No additional management support is needed unless otherwise documented below in the visit note. 

## 2014-01-14 NOTE — Assessment & Plan Note (Signed)
BP Readings from Last 3 Encounters:  01/14/14 120/88  07/25/13 120/80  06/23/13 118/80

## 2014-01-14 NOTE — Assessment & Plan Note (Signed)
Continue with current prescription therapy as reflected on the Med list.  

## 2014-01-14 NOTE — Assessment & Plan Note (Signed)
Wt Readings from Last 3 Encounters:  01/14/14 327 lb (148.326 kg)  07/25/13 324 lb (146.965 kg)  06/23/13 322 lb (146.058 kg)  Curves Belviq to try

## 2014-01-14 NOTE — Progress Notes (Signed)
Subjective:   The patient is here for a wellness exam.  Back Pain This is a chronic problem. The current episode started more than 1 year ago. The problem occurs intermittently. The problem has been waxing and waning since onset. The pain is present in the lumbar spine. The quality of the pain is described as burning. The pain radiates to the right thigh (R groin). The pain is severe. The pain is worse during the day. The symptoms are aggravated by bending, lying down and standing. Pertinent negatives include no abdominal pain, chest pain, dysuria or fever. She has tried analgesics for the symptoms. The treatment provided moderate relief.    F/u LBP - not better - butrans caused side effects F/u depression Saw her GYN, had an [place] The patient needs to address  Chronic OA, joint pains-worse, depression controlled with medical treatment and diet. C/o obesity an wt gain. C/o fatigue,  C/o "body heat"  Wt Readings from Last 3 Encounters:  01/14/14 327 lb (148.326 kg)  07/25/13 324 lb (146.965 kg)  06/23/13 322 lb (146.058 kg)   BP Readings from Last 3 Encounters:  01/14/14 120/88  07/25/13 120/80  06/23/13 118/80       Review of Systems  Constitutional: Negative for fever, chills, diaphoresis, activity change, appetite change, fatigue and unexpected weight change.  HENT: Negative for congestion, dental problem, ear pain, hearing loss, mouth sores, postnasal drip, sinus pressure, sneezing, sore throat and voice change.   Eyes: Negative for pain and visual disturbance.  Respiratory: Negative for cough, chest tightness, wheezing and stridor.   Cardiovascular: Negative for chest pain, palpitations and leg swelling.  Gastrointestinal: Negative for nausea, vomiting, abdominal pain, blood in stool, abdominal distention and rectal pain.  Genitourinary: Negative for dysuria, hematuria, decreased urine volume, vaginal bleeding, vaginal discharge, difficulty urinating, vaginal pain and  menstrual problem.  Musculoskeletal: Negative for back pain, gait problem, joint swelling and neck pain.  Skin: Negative for color change, rash and wound.  Neurological: Negative for dizziness, tremors, syncope, speech difficulty and light-headedness.  Hematological: Negative for adenopathy.  Psychiatric/Behavioral: Negative for suicidal ideas, hallucinations, behavioral problems, confusion, sleep disturbance, self-injury, dysphoric mood and decreased concentration. The patient is nervous/anxious. The patient is not hyperactive.        Objective:   Physical Exam  Constitutional: She appears well-developed. No distress.  Obese  HENT:  Head: Normocephalic.  Right Ear: External ear normal.  Left Ear: External ear normal.  Nose: Nose normal.  Mouth/Throat: Oropharynx is clear and moist.  Eyes: Conjunctivae are normal. Pupils are equal, round, and reactive to light. Right eye exhibits no discharge. Left eye exhibits no discharge.  Neck: Normal range of motion. Neck supple. No JVD present. No tracheal deviation present. No thyromegaly present.  Cardiovascular: Normal rate, regular rhythm and normal heart sounds.   Pulmonary/Chest: No stridor. No respiratory distress. She has no wheezes.  Abdominal: Soft. Bowel sounds are normal. She exhibits no distension and no mass. There is no tenderness. There is no rebound and no guarding.  Musculoskeletal: She exhibits no edema and no tenderness.  Lymphadenopathy:    She has no cervical adenopathy.  Neurological: She displays normal reflexes. No cranial nerve deficit. She exhibits normal muscle tone. Coordination normal.  Skin: No rash noted. No erythema.  Psychiatric: She has a normal mood and affect. Her behavior is normal. Judgment and thought content normal.   Labs  12.3.13  Lab Results  Component Value Date   WBC 7.7 08/01/2010  HGB 13.4 08/01/2010   HCT 38.3 08/01/2010   PLT 231.0 08/01/2010   GLUCOSE 85 08/01/2010   CHOL 190 04/14/2010    TRIG 124.0 04/14/2010   HDL 51.20 04/14/2010   LDLDIRECT 145.5 09/12/2007   LDLCALC 114* 04/14/2010   ALT 17 08/01/2010   AST 19 08/01/2010   NA 140 08/01/2010   K 4.3 08/01/2010   CL 108 08/01/2010   CREATININE 0.8 08/01/2010   BUN 9 08/01/2010   CO2 25 08/01/2010   TSH 0.44 09/15/2010   HGBA1C 5.4 09/23/2008         Assessment & Plan:

## 2014-01-15 ENCOUNTER — Other Ambulatory Visit: Payer: Self-pay | Admitting: Internal Medicine

## 2014-01-15 ENCOUNTER — Encounter: Payer: Self-pay | Admitting: Internal Medicine

## 2014-01-15 LAB — ANA: ANA: NEGATIVE

## 2014-01-21 ENCOUNTER — Encounter: Payer: BC Managed Care – PPO | Admitting: Internal Medicine

## 2014-02-18 ENCOUNTER — Encounter: Payer: Self-pay | Admitting: Internal Medicine

## 2014-03-23 ENCOUNTER — Telehealth: Payer: Self-pay | Admitting: *Deleted

## 2014-03-23 ENCOUNTER — Other Ambulatory Visit: Payer: Self-pay | Admitting: Internal Medicine

## 2014-03-23 NOTE — Telephone Encounter (Signed)
OK to fill this prescription with additional refills x1 Thank you!  

## 2014-03-23 NOTE — Telephone Encounter (Signed)
Pt is requesting Rf on cyclobenzaprine 10 mg 1 po bid. # 60. Ok to Rf?

## 2014-03-24 MED ORDER — CYCLOBENZAPRINE HCL 10 MG PO TABS: 10.0000 mg | ORAL_TABLET | Freq: Two times a day (BID) | ORAL | Status: DC | PRN

## 2014-03-24 NOTE — Telephone Encounter (Signed)
Done

## 2014-04-15 ENCOUNTER — Encounter: Payer: Self-pay | Admitting: Internal Medicine

## 2014-05-20 ENCOUNTER — Other Ambulatory Visit: Payer: Self-pay | Admitting: Internal Medicine

## 2014-05-21 NOTE — Telephone Encounter (Addendum)
Refill request for Tramadol 50mg . Last OV 3.4.15 Last filled 12.23.14  Refill request for Clonazepam 1mg .  Last OV 3.4.15

## 2014-05-28 ENCOUNTER — Telehealth: Payer: Self-pay | Admitting: Internal Medicine

## 2014-05-28 DIAGNOSIS — Z0000 Encounter for general adult medical examination without abnormal findings: Secondary | ICD-10-CM

## 2014-05-28 NOTE — Telephone Encounter (Signed)
Patient called to schedule medication f/u appt for 06/25/14. She states she believes she needs labs and would like to have these done prior to her visit if possible. She can get them done for free at [facility] bc she works there. Please advise.

## 2014-05-28 NOTE — Telephone Encounter (Signed)
LFTs, CBC, BMET, Hep C AB, HepBsAG, HepBsAB, ferritin, TSH, FT4 Thx

## 2014-05-29 NOTE — Telephone Encounter (Signed)
Lab req printed and faxed to  [facility] at [phone] per pt request.

## 2014-06-01 ENCOUNTER — Other Ambulatory Visit: Payer: Self-pay | Admitting: Internal Medicine

## 2014-06-02 ENCOUNTER — Other Ambulatory Visit: Payer: Self-pay

## 2014-06-02 DIAGNOSIS — Z1231 Encounter for screening mammogram for malignant neoplasm of breast: Secondary | ICD-10-CM

## 2014-06-10 LAB — HCV AB W/RFLX TO VERIFICATION: HCV Ab: NONREACTIVE

## 2014-06-10 LAB — CBC WITH DIFFERENTIAL/PLATELET
BASOS ABS: 0 /uL
Basophils Absolute: 1 /uL
EOS ABS: 0 /uL
EOS: 2 %
GRANULOCYTES: 0
HCT: 43 %
Hemoglobin, FL: 14.8
LYMPH#: 34
Lymphs(Absolute): 2.1
MCH: 29
MCHC: 34.1
MCV: 87 fL
MONOCYTE FLUID: 6
Monocytes(Absolute): 0.4
NEUTROPHIL FLUID: 57 %
Neutrophil Count, Fluid: 3.4 %
PLATELETS: 244
RBC: 5.01
RDW: 15.3
WBC: 6

## 2014-06-10 LAB — BASIC METABOLIC PANEL
BUN/Creatinine Ratio: 11
BUN: 9
CREATINE, SERUM: 0.85
Calcium: 24 mg/dL — AB (ref 8.7–10.7)
Chloride, Serum: 101
GLUCOSE: 120
Potassium, serum: 4.5
Sodium, serum: 139

## 2014-06-10 LAB — HEPATIC FUNCTION PANEL
ALK PHOS: 60 U/L
ALT: 33 U/L (ref 7–35)
AST: 32 U/L (ref 13–35)
Albumin: 4.2
BILIRUBIN, TOTAL: 0.3
Bilirubin, Direct: 0.08 mg/dL (ref 0.01–0.4)
PROTEIN S TOTAL: 7

## 2014-06-10 LAB — HEPATITIS B SURFACE ANTIGEN: HEPATITIS B SURFACE ANTIGEN: NEGATIVE

## 2014-06-10 LAB — TSH: TSH: 0.767

## 2014-06-10 LAB — HEPATITIS B SURF AG CONFIRMATION: HBsAg Conf: NEGATIVE

## 2014-06-10 LAB — T4: Thyroxine (T4): 7

## 2014-06-15 ENCOUNTER — Ambulatory Visit: Payer: BC Managed Care – PPO | Admitting: Internal Medicine

## 2014-06-15 ENCOUNTER — Ambulatory Visit (INDEPENDENT_AMBULATORY_CARE_PROVIDER_SITE_OTHER): Payer: BC Managed Care – PPO | Admitting: Internal Medicine

## 2014-06-15 ENCOUNTER — Encounter: Payer: Self-pay | Admitting: Internal Medicine

## 2014-06-15 VITALS — BP 130/78 | HR 80 | Temp 98.2°F | Resp 16 | Wt 313.0 lb

## 2014-06-15 DIAGNOSIS — E669 Obesity, unspecified: Secondary | ICD-10-CM

## 2014-06-15 DIAGNOSIS — F3289 Other specified depressive episodes: Secondary | ICD-10-CM | POA: Diagnosis not present

## 2014-06-15 DIAGNOSIS — N943 Premenstrual tension syndrome: Secondary | ICD-10-CM

## 2014-06-15 DIAGNOSIS — Z566 Other physical and mental strain related to work: Secondary | ICD-10-CM

## 2014-06-15 DIAGNOSIS — M545 Low back pain, unspecified: Secondary | ICD-10-CM | POA: Diagnosis not present

## 2014-06-15 DIAGNOSIS — F329 Major depressive disorder, single episode, unspecified: Secondary | ICD-10-CM | POA: Diagnosis not present

## 2014-06-15 DIAGNOSIS — R21 Rash and other nonspecific skin eruption: Secondary | ICD-10-CM | POA: Diagnosis not present

## 2014-06-15 DIAGNOSIS — F911 Conduct disorder, childhood-onset type: Secondary | ICD-10-CM

## 2014-06-15 DIAGNOSIS — R454 Irritability and anger: Secondary | ICD-10-CM

## 2014-06-15 DIAGNOSIS — Z569 Unspecified problems related to employment: Secondary | ICD-10-CM

## 2014-06-15 MED ORDER — CLONAZEPAM 1 MG PO TABS: ORAL_TABLET | ORAL | Status: DC

## 2014-06-15 MED ORDER — TRIAMCINOLONE ACETONIDE 0.1 % EX CREA: 1.0000 "application " | TOPICAL_CREAM | Freq: Four times a day (QID) | CUTANEOUS | Status: DC

## 2014-06-15 MED ORDER — TRAMADOL HCL 50 MG PO TABS: ORAL_TABLET | ORAL | Status: DC

## 2014-06-15 MED ORDER — FENTANYL 12 MCG/HR TD PT72: 12.5000 ug | MEDICATED_PATCH | TRANSDERMAL | Status: DC

## 2014-06-15 MED ORDER — METHYLPREDNISOLONE ACETATE 80 MG/ML IJ SUSP
80.0000 mg | Freq: Once | INTRAMUSCULAR | Status: AC
  Administered 2014-06-16: 80 mg via INTRAMUSCULAR

## 2014-06-15 NOTE — Assessment & Plan Note (Signed)
Loosing wt on diet 

## 2014-06-15 NOTE — Progress Notes (Signed)
Subjective:    Rash This is a new problem. The current episode started yesterday. The affected locations include the torso. Pertinent negatives include no congestion, cough, eye pain, fatigue, sore throat or vomiting.    F/u depression, stress - leaving her husband, dtr starting college Saw her GYN, had an [place] The patient needs to address  Chronic OA, joint pains-worse, depression controlled with medical treatment and diet. C/o obesity an wt gain - lost wt. C/o fatigue,  C/o "body heat"  Wt Readings from Last 3 Encounters:  06/15/14 313 lb (141.976 kg)  01/14/14 327 lb (148.326 kg)  07/25/13 324 lb (146.965 kg)   BP Readings from Last 3 Encounters:  06/15/14 130/78  01/14/14 120/88  07/25/13 120/80       Review of Systems  Constitutional: Negative for chills, diaphoresis, activity change, appetite change, fatigue and unexpected weight change.  HENT: Negative for congestion, dental problem, ear pain, hearing loss, mouth sores, postnasal drip, sinus pressure, sneezing, sore throat and voice change.   Eyes: Negative for pain and visual disturbance.  Respiratory: Negative for cough, chest tightness, wheezing and stridor.   Cardiovascular: Negative for palpitations and leg swelling.  Gastrointestinal: Negative for nausea, vomiting, blood in stool, abdominal distention and rectal pain.  Genitourinary: Negative for hematuria, decreased urine volume, vaginal bleeding, vaginal discharge, difficulty urinating, vaginal pain and menstrual problem.  Musculoskeletal: Positive for back pain. Negative for gait problem, joint swelling and neck pain.  Skin: Positive for rash. Negative for color change and wound.  Neurological: Negative for dizziness, tremors, syncope, speech difficulty and light-headedness.  Hematological: Negative for adenopathy.  Psychiatric/Behavioral: Negative for suicidal ideas, hallucinations, behavioral problems, confusion, sleep disturbance, self-injury, dysphoric  mood and decreased concentration. The patient is nervous/anxious. The patient is not hyperactive.        Objective:   Physical Exam  Constitutional: She appears well-developed. No distress.  Obese  HENT:  Head: Normocephalic.  Right Ear: External ear normal.  Left Ear: External ear normal.  Nose: Nose normal.  Mouth/Throat: Oropharynx is clear and moist.  Eyes: Conjunctivae are normal. Pupils are equal, round, and reactive to light. Right eye exhibits no discharge. Left eye exhibits no discharge.  Neck: Normal range of motion. Neck supple. No JVD present. No tracheal deviation present. No thyromegaly present.  Cardiovascular: Normal rate, regular rhythm and normal heart sounds.   Pulmonary/Chest: No stridor. No respiratory distress. She has no wheezes.  Abdominal: Soft. Bowel sounds are normal. She exhibits no distension and no mass. There is no tenderness. There is no rebound and no guarding.  Musculoskeletal: She exhibits no edema and no tenderness.  Lymphadenopathy:    She has no cervical adenopathy.  Neurological: She displays normal reflexes. No cranial nerve deficit. She exhibits normal muscle tone. Coordination normal.  Skin: Rash noted. No erythema.  Psychiatric: She has a normal mood and affect. Her behavior is normal. Judgment and thought content normal.  eryth papules on trunk and extr-s 2-3 mm   Lab Results  Component Value Date   WBC 6.0 06/08/2014   HGB 14.1 01/14/2014   HCT 43 06/08/2014   PLT 236.0 01/14/2014   GLUCOSE 89 01/14/2014   CHOL 188 01/14/2014   TRIG 158.0* 01/14/2014   HDL 55.10 01/14/2014   LDLDIRECT 145.5 09/12/2007   LDLCALC 101* 01/14/2014   ALT 33 06/08/2014   AST 32 06/08/2014   NA 137 01/14/2014   K 4.2 01/14/2014   CL 102 01/14/2014   CREATININE 0.8 01/14/2014  BUN 9 06/08/2014   CO2 27 01/14/2014   TSH 0.767 06/08/2014   HGBA1C 6.2 01/14/2014         Assessment & Plan:

## 2014-06-15 NOTE — Progress Notes (Signed)
Pre visit review using our clinic review tool, if applicable. No additional management support is needed unless otherwise documented below in the visit note. 

## 2014-06-15 NOTE — Assessment & Plan Note (Signed)
More home stress now Discussed

## 2014-06-15 NOTE — Assessment & Plan Note (Signed)
Continue with current prescription therapy as reflected on the Med list.  

## 2014-06-15 NOTE — Assessment & Plan Note (Signed)
Continue with current prescription therapy as reflected on the Med list.  Potential benefits of a long term benzodiazepines  use as well as potential risks  and complications were explained to the patient and were aknowledged.  

## 2014-06-15 NOTE — Assessment & Plan Note (Signed)
Depo-medrol inj 80 mg  Potential benefits of a short term steroid  use as well as potential risks  and complications were explained to the patient and were aknowledged. Triamcinol cream prn Benadryl prn

## 2014-06-15 NOTE — Assessment & Plan Note (Signed)
We reduced Fentanyl dose - plan to stop

## 2014-06-16 DIAGNOSIS — R21 Rash and other nonspecific skin eruption: Secondary | ICD-10-CM

## 2014-06-22 ENCOUNTER — Ambulatory Visit
Admission: RE | Admit: 2014-06-22 | Discharge: 2014-06-22 | Disposition: A | Discharge status: Home / Self Care | Payer: BC Managed Care – PPO | Source: Ambulatory Visit

## 2014-06-22 DIAGNOSIS — Z1231 Encounter for screening mammogram for malignant neoplasm of breast: Secondary | ICD-10-CM

## 2014-06-25 ENCOUNTER — Telehealth: Payer: Self-pay | Admitting: *Deleted

## 2014-06-25 ENCOUNTER — Ambulatory Visit: Payer: BC Managed Care – PPO | Admitting: Internal Medicine

## 2014-06-25 MED ORDER — PREDNISONE 10 MG PO TABS: ORAL_TABLET | ORAL | Status: DC

## 2014-06-25 NOTE — Telephone Encounter (Signed)
Ok Prednisone 10 mg: take 4 tabs a day x 3 days; then 3 tabs a day x 4 days; then 2 tabs a day x 4 days, then 1 tab a day x 6 days, then stop. Take pc. Thx

## 2014-06-25 NOTE — Telephone Encounter (Signed)
Left msg on triage stating she has either poison ivy or poison oak wanting to get prednisone pck sent to [facility]...Johny Chess

## 2014-06-26 MED ORDER — PREDNISONE 10 MG PO TABS: ORAL_TABLET | ORAL | Status: DC

## 2014-06-26 NOTE — Telephone Encounter (Signed)
Resent prednisone electronically pt has been notified...Sylvia Alvarado

## 2014-07-17 ENCOUNTER — Telehealth: Payer: Self-pay | Admitting: Internal Medicine

## 2014-07-17 NOTE — Telephone Encounter (Signed)
Pt needs new Rs's on the following: clonazePAM (KLONOPIN) 1 MG tablet traZODone (DESYREL) 50 MG tablet DULoxetine (CYMBALTA) 60 MG capsule fentaNYL (DURAGESIC) 12 MCG/HR cyclobenzaprine (FLEXERIL) 10 MG tablet metFORMIN (GLUCOPHAGE) 500 MG tablet traMADol (ULTRAM) 50 MG tablet  She states that while moving she lost the scripts.  [facility] in [place]  (f) [phone]

## 2014-07-17 NOTE — Telephone Encounter (Signed)
Ok to renew Thx 

## 2014-07-21 MED ORDER — FENTANYL 12 MCG/HR TD PT72: 12.5000 ug | MEDICATED_PATCH | TRANSDERMAL | Status: DC

## 2014-07-21 MED ORDER — CYCLOBENZAPRINE HCL 10 MG PO TABS: 10.0000 mg | ORAL_TABLET | Freq: Two times a day (BID) | ORAL | Status: DC

## 2014-07-21 MED ORDER — METFORMIN HCL 500 MG PO TABS: 500.0000 mg | ORAL_TABLET | Freq: Every day | ORAL | Status: DC

## 2014-07-21 MED ORDER — TRAZODONE HCL 50 MG PO TABS: ORAL_TABLET | ORAL | Status: DC

## 2014-07-21 MED ORDER — CLONAZEPAM 1 MG PO TABS: ORAL_TABLET | ORAL | Status: DC

## 2014-07-21 MED ORDER — TRAMADOL HCL 50 MG PO TABS: ORAL_TABLET | ORAL | Status: DC

## 2014-07-21 MED ORDER — DULOXETINE HCL 60 MG PO CPEP: 60.0000 mg | ORAL_CAPSULE | Freq: Every day | ORAL | Status: DC

## 2014-07-21 NOTE — Telephone Encounter (Signed)
Done. Pt informed to p/u Rxs upfront.

## 2014-07-24 ENCOUNTER — Telehealth: Payer: Self-pay | Admitting: *Deleted

## 2014-07-24 NOTE — Telephone Encounter (Signed)
Pt called stating she is not able to come pick up her prescriptions today, but is out of her clonazepam. Requesting the clonazepam to be call into [facility]. Called pharmacy spoke with Tripp/pharmacist gave md approval.../lmb

## 2014-07-27 ENCOUNTER — Other Ambulatory Visit: Payer: Self-pay | Admitting: Internal Medicine

## 2014-08-06 ENCOUNTER — Encounter: Payer: Self-pay | Admitting: Internal Medicine

## 2014-08-10 ENCOUNTER — Other Ambulatory Visit: Payer: Self-pay | Admitting: Internal Medicine

## 2014-08-17 ENCOUNTER — Other Ambulatory Visit: Payer: Self-pay | Admitting: Internal Medicine

## 2014-08-17 DIAGNOSIS — M25539 Pain in unspecified wrist: Secondary | ICD-10-CM

## 2014-08-19 ENCOUNTER — Telehealth: Payer: Self-pay | Admitting: Family Medicine

## 2014-08-19 NOTE — Telephone Encounter (Signed)
Patient would also like to know if she would be able to return to work that day

## 2014-08-19 NOTE — Telephone Encounter (Signed)
Left detailed msg on pt's vmail.  

## 2014-08-19 NOTE — Telephone Encounter (Signed)
[phone] Patient states she has an appt Monday for cortisone injection.  She is currently taking prednisone.  She would like to know if she needs to stop the prednisone.

## 2014-08-24 ENCOUNTER — Other Ambulatory Visit (INDEPENDENT_AMBULATORY_CARE_PROVIDER_SITE_OTHER): Payer: BC Managed Care – PPO

## 2014-08-24 ENCOUNTER — Telehealth: Payer: Self-pay | Admitting: Family Medicine

## 2014-08-24 ENCOUNTER — Encounter: Payer: Self-pay | Admitting: Family Medicine

## 2014-08-24 ENCOUNTER — Ambulatory Visit (INDEPENDENT_AMBULATORY_CARE_PROVIDER_SITE_OTHER): Payer: BC Managed Care – PPO | Admitting: Family Medicine

## 2014-08-24 VITALS — BP 120/82 | HR 59 | Ht 68.0 in | Wt 308.0 lb

## 2014-08-24 DIAGNOSIS — G5603 Carpal tunnel syndrome, bilateral upper limbs: Secondary | ICD-10-CM

## 2014-08-24 DIAGNOSIS — M79642 Pain in left hand: Secondary | ICD-10-CM

## 2014-08-24 DIAGNOSIS — G5600 Carpal tunnel syndrome, unspecified upper limb: Secondary | ICD-10-CM | POA: Insufficient documentation

## 2014-08-24 DIAGNOSIS — G5601 Carpal tunnel syndrome, right upper limb: Secondary | ICD-10-CM

## 2014-08-24 DIAGNOSIS — M797 Fibromyalgia: Secondary | ICD-10-CM

## 2014-08-24 DIAGNOSIS — G5602 Carpal tunnel syndrome, left upper limb: Secondary | ICD-10-CM

## 2014-08-24 DIAGNOSIS — M79641 Pain in right hand: Secondary | ICD-10-CM

## 2014-08-24 LAB — VITAMIN D 25 HYDROXY (VIT D DEFICIENCY, FRACTURES): VITD: 31.43 ng/mL (ref 30.00–100.00)

## 2014-08-24 LAB — COMPLETE METABOLIC PANEL WITH GFR
ALT: 13 U/L (ref 0–35)
AST: 14 U/L (ref 0–37)
Albumin: 3.9 g/dL (ref 3.5–5.2)
Alkaline Phosphatase: 56 U/L (ref 39–117)
BILIRUBIN TOTAL: 0.3 mg/dL (ref 0.2–1.2)
BUN: 9 mg/dL (ref 6–23)
CALCIUM: 9.2 mg/dL (ref 8.4–10.5)
CHLORIDE: 103 meq/L (ref 96–112)
CO2: 28 meq/L (ref 19–32)
CREATININE: 0.86 mg/dL (ref 0.50–1.10)
GFR, EST NON AFRICAN AMERICAN: 84 mL/min
GLUCOSE: 81 mg/dL (ref 70–99)
Potassium: 4.7 mEq/L (ref 3.5–5.3)
Sodium: 138 mEq/L (ref 135–145)
Total Protein: 6.7 g/dL (ref 6.0–8.3)

## 2014-08-24 LAB — SEDIMENTATION RATE: Sed Rate: 19 mm/hr (ref 0–22)

## 2014-08-24 LAB — CALCIUM, IONIZED: Calcium, Ion: 1.32 mmol/L (ref 1.12–1.32)

## 2014-08-24 NOTE — Telephone Encounter (Signed)
Sylvia Alvarado patient back posterior calcium level is 9.2 which is normal.

## 2014-08-24 NOTE — Progress Notes (Signed)
[facility] [address], [place] [address] Phone: [phone] Subjective:    I'm seeing this patient by the request  of:  Walker Kehr, MD   CC:  Bilateral wrist pain  [id number] Sylvia Alvarado is a 43 y.o. female coming in with complaint of bilateral wrist pain. Patient has been told that she does have carpal tunnel syndrome bilaterally. Patient also has a past medical history significant for fibromyalgia. Patient states she's been having bilateral wrist pain for quite some time is also having significant polyarthralgia that is more liquid the last 3 months. Patient has been referred to other providers without any significant findings. Patient states it is becoming more and more difficult for her to do activities secondary to the discomfort in her hands. Patient has been trying to do the bracing at night and anti-inflammatories with no significant improvement. Patient states that it feels "like she has collapse in multiple joints. Patient is also noticed that she's been having increasing emotional strain. Patient states it is difficult to sleep at night as well as to sew her daily activities. Patient was the severity of 8/10.     Past medical history, social, surgical and family history all reviewed in electronic medical record.   Review of Systems: No headache, visual changes, nausea, vomiting, diarrhea, constipation, dizziness, abdominal pain, skin rash, fevers, chills, night sweats, weight loss, swollen lymph nodes, body aches, joint swelling, muscle aches, chest pain, shortness of breath, mood changes.   Objective Blood pressure 120/82, pulse 59, height 5\' 8"  (1.727 m), weight 308 lb (139.708 kg), SpO2 98.00%.  General: No apparent distress alert and oriented x3 mood and affect normal, dressed appropriately.  HEENT: Pupils equal, extraocular movements intact  Respiratory: Patient's speak in full sentences and does not appear short of breath    Cardiovascular: No lower extremity edema, non tender, no erythema  Skin: Warm dry intact with no signs of infection or rash on extremities or on axial skeleton.  Abdomen: Soft nontender  Neuro: Cranial nerves II through XII are intact, neurovascularly intact in all extremities with 2+ DTRs and 2+ pulses.  Lymph: No lymphadenopathy of posterior or anterior cervical chain or axillae bilaterally.  Gait normal with good balance and coordination.  MSK:  Non tender with full range of motion and good stability and symmetric strength and tone of shoulders, elbows,  hip, knee and ankles bilaterally.  Wrist: Bilateral Inspection normal with no visible erythema or swelling. ROM smooth and normal with good flexion and extension and ulnar/radial deviation that is symmetrical with opposite wrist. Patient though does have pain at extreme flexion and extension as well as ulnar or radial deviation. Palpation is normal over metacarpals, navicular, lunate, and TFCC; tendons without tenderness/ swelling No snuffbox tenderness. No tenderness over Canal of Guyon. Strength 5/5 in all directions without pain. Negative Finkelstein, positive tinel's and phalens. Negative Watson's test.  MSK US performed of: Bilateral This study was ordered, performed, and interpreted by Charlann Boxer D.O.  Wrist: All extensor compartments visualized and tendons all normal in appearance without fraying, tears, or sheath effusions. No effusion seen. TFCC intact. Scapholunate ligament intact. Carpal tunnel visualized and median nerve area is a large bilaterally. Patient right wrist measures 2.5 cm in area and patient left measures 2.0 for centimeters..  IMPRESSION:  Bilateral carpal tunnel syndrome.   Procedure: Real-time Ultrasound Guided Injection of bilateral carpal tunnel injections  Device: GE Logiq E  Ultrasound guided injection is preferred based studies  that show increased duration, increased effect, greater accuracy,  decreased procedural pain, increased response rate, and decreased cost with ultrasound guided versus blind injection.  Verbal informed consent obtained.  Time-out conducted.  Noted no overlying erythema, induration, or other signs of local infection.  Skin prepped in a sterile fashion.  Local anesthesia: Topical Ethyl chloride.  With sterile technique and under real time ultrasound guidance:  Patient had this procedure done twice with ultrasound guidance. With a 25-gauge 1 inch needle patient was injected with 0.5 cc of 0.5% Marcaine and 0.5 cc of Kenalog 40 mg/dL into the median nerve. Completed without difficulty  Pain immediately resolved suggesting accurate placement of the medication.  Advised to call if fevers/chills, erythema, induration, drainage, or persistent bleeding.  Images permanently stored and available for review in the ultrasound unit.  Impression: Technically successful ultrasound guided injection.     Impression and Recommendations:     This case required medical decision making of moderate complexity.

## 2014-08-24 NOTE — Assessment & Plan Note (Signed)
Patient was given injection today. Patient's ultrasound shows that she did have significant enlargement of the median nerves bilaterally. We discussed an icing protocol as well as a home exercise program as well as bracing. Patient did have some elevation in her calcium level and last blood values. We are going to recheck this to see if that is playing any role. Patient and will come back and see me again in 7-10 days to make sure she continues to improve. Due to the size of the median nerves unfortunately surgical intervention may be necessary later on.

## 2014-08-24 NOTE — Telephone Encounter (Signed)
Pt called stated that Dr. Tamala Julian would like to know the calcium level, please give pt a call back

## 2014-08-24 NOTE — Patient Instructions (Addendum)
Good to meet you Ice 20 minutes 2 times daily. Usually after activity and before bed. Exercises 3 times a week.  Starting in 2 days.  Labs today.  Vitamin D 4000IU daily  Then Turmeric 500mg  twice daily Where brace day and night for 2 days then nightly for 2 weeks.  Come back in 7-10 days

## 2014-08-24 NOTE — Assessment & Plan Note (Signed)
Patient does have chronic aches and pains but we did have hypercalcemia one time. We will recheck labs to see if this was a lab error or if it is elevated and then consider further workup.

## 2014-08-25 LAB — PTH, INTACT AND CALCIUM
CALCIUM: 9.2 mg/dL (ref 8.4–10.5)
PTH: 38 pg/mL (ref 14–64)

## 2014-08-27 ENCOUNTER — Telehealth: Payer: Self-pay | Admitting: *Deleted

## 2014-08-27 MED ORDER — TRAZODONE HCL 50 MG PO TABS: ORAL_TABLET | ORAL | Status: DC

## 2014-08-27 NOTE — Telephone Encounter (Signed)
Done

## 2014-08-27 NOTE — Telephone Encounter (Signed)
Rf erq for Trazodone 50 mg 2 po qhs. # 60. Last filled 07/24/14.  Please advise

## 2014-08-27 NOTE — Telephone Encounter (Signed)
OK to fill this prescription with additional refills x5 Thank you!  

## 2014-08-28 LAB — PTH-RELATED PEPTIDE: PTH-RELATED PROTEIN (PTH-RP): 11 pg/mL — AB (ref 14–27)

## 2014-08-31 ENCOUNTER — Encounter: Payer: Self-pay | Admitting: Internal Medicine

## 2014-09-15 ENCOUNTER — Encounter: Payer: Self-pay | Admitting: Internal Medicine

## 2014-09-15 ENCOUNTER — Ambulatory Visit (INDEPENDENT_AMBULATORY_CARE_PROVIDER_SITE_OTHER): Payer: BC Managed Care – PPO | Admitting: Family Medicine

## 2014-09-15 ENCOUNTER — Encounter: Payer: Self-pay | Admitting: Family Medicine

## 2014-09-15 ENCOUNTER — Ambulatory Visit (INDEPENDENT_AMBULATORY_CARE_PROVIDER_SITE_OTHER): Payer: BC Managed Care – PPO | Admitting: Internal Medicine

## 2014-09-15 VITALS — BP 130/86 | HR 77 | Ht 68.0 in | Wt 299.0 lb

## 2014-09-15 VITALS — BP 130/86 | HR 77 | Temp 97.8°F | Wt 299.0 lb

## 2014-09-15 DIAGNOSIS — F4321 Adjustment disorder with depressed mood: Secondary | ICD-10-CM

## 2014-09-15 DIAGNOSIS — E669 Obesity, unspecified: Secondary | ICD-10-CM

## 2014-09-15 DIAGNOSIS — G5601 Carpal tunnel syndrome, right upper limb: Secondary | ICD-10-CM

## 2014-09-15 DIAGNOSIS — I10 Essential (primary) hypertension: Secondary | ICD-10-CM

## 2014-09-15 DIAGNOSIS — G5602 Carpal tunnel syndrome, left upper limb: Secondary | ICD-10-CM

## 2014-09-15 DIAGNOSIS — G5603 Carpal tunnel syndrome, bilateral upper limbs: Secondary | ICD-10-CM

## 2014-09-15 DIAGNOSIS — Z566 Other physical and mental strain related to work: Secondary | ICD-10-CM

## 2014-09-15 DIAGNOSIS — M5440 Lumbago with sciatica, unspecified side: Secondary | ICD-10-CM

## 2014-09-15 MED ORDER — FENTANYL 12 MCG/HR TD PT72: 12.5000 ug | MEDICATED_PATCH | TRANSDERMAL | Status: DC

## 2014-09-15 MED ORDER — CYCLOBENZAPRINE HCL 10 MG PO TABS: 10.0000 mg | ORAL_TABLET | Freq: Two times a day (BID) | ORAL | Status: DC

## 2014-09-15 NOTE — Progress Notes (Signed)
  [facility] [address], [place] [address] Phone: [phone] Subjective:    I'm seeing this patient by the request  of:  Walker Kehr, MD   CC:  Bilateral wrist pain follow up  [id number] TAI SYFERT is a 43 y.o. female coming in for bilateral wrist pain. Patient was seen previously and did have severe carpal tunnel syndrome bilaterally. Patient did have ultrasound guided injections. Patient also given home exercises, bracing at night, as well as over-the-counter medications. Patient states that she is approximately 98% better. Patient states that she is doing relatively well at this time. Patient can do all her regular daily activities without any significant discomfort or pain. Not having any of the numbness at this time. Patient states that she is doing significantly better and is very happy. No nighttime awakenings..     Past medical history, social, surgical and family history all reviewed in electronic medical record.   Review of Systems: No headache, visual changes, nausea, vomiting, diarrhea, constipation, dizziness, abdominal pain, skin rash, fevers, chills, night sweats, weight loss, swollen lymph nodes, body aches, joint swelling, muscle aches, chest pain, shortness of breath, mood changes.   Objective Blood pressure 130/86, pulse 77, height 5\' 8"  (1.727 m), weight 299 lb (135.626 kg), SpO2 97 %.  General: No apparent distress alert and oriented x3 mood and affect normal, dressed appropriately.  HEENT: Pupils equal, extraocular movements intact  Respiratory: Patient's speak in full sentences and does not appear short of breath  Cardiovascular: No lower extremity edema, non tender, no erythema  Skin: Warm dry intact with no signs of infection or rash on extremities or on axial skeleton.  Abdomen: Soft nontender  Neuro: Cranial nerves II through XII are intact, neurovascularly intact in all extremities with 2+ DTRs and 2+ pulses.    Lymph: No lymphadenopathy of posterior or anterior cervical chain or axillae bilaterally.  Gait normal with good balance and coordination.  MSK:  Non tender with full range of motion and good stability and symmetric strength and tone of shoulders, elbows,  hip, knee and ankles bilaterally.  Wrist: Bilateral Inspection normal with no visible erythema or swelling. ROM smooth and normal with good flexion and extension and ulnar/radial deviation that is symmetrical with opposite wrist. Palpation is normal over metacarpals, navicular, lunate, and TFCC; tendons without tenderness/ swelling No snuffbox tenderness. No tenderness over Canal of Guyon. Strength 5/5 in all directions without pain. Negative Finkelstein, negative tinel's and phalens. Negative Watson's test.     Impression and Recommendations:     This case required medical decision making of moderate complexity.

## 2014-09-15 NOTE — Assessment & Plan Note (Signed)
Patient is doing significantly better 2 weeks after the injections. Patient at this time is encouraged to continue the over-the-counter medications and hopefully she will start titrating off some of her pain medications. Discussed with patient he continue the exercises 3 times a week for another 6 weeks as well as an icing pedicle. We discussed continuing the braces at night for another 2 weeks. Patient will come back and 2 months if continuing to have any difficulty and we can repeat the injections if necessary.  Spent greater than 25 minutes with patient face-to-face and had greater than 50% of counseling including as described above in assessment and plan.

## 2014-09-15 NOTE — Assessment & Plan Note (Addendum)
Better Continue with current prescription therapy as reflected on the Med list.  

## 2014-09-15 NOTE — Assessment & Plan Note (Signed)
Continue with current prescription therapy as reflected on the Med list.  

## 2014-09-15 NOTE — Assessment & Plan Note (Signed)
11/15: Work and school - may grad in May; separated, moved out to live w/her [employer]; dtr 43 yo

## 2014-09-15 NOTE — Patient Instructions (Signed)
You are making my job too easy.  Ice is your friend Continue the braces at night for 2 weeks.  Exercises 3 times a week. For another 6 weeks.  Continue the vitamins See me again when you need me.

## 2014-09-15 NOTE — Progress Notes (Signed)
Subjective:    HPI  11/15: Work and school - may grad in May; separated, moved out to live w/her [employer]; dtr 43 yo F/u depression, stress - leaving her husband, dtr starting college Saw her GYN, had an [place] The patient needs to address  Chronic OA, joint pains-worse, depression controlled with medical treatment and diet. F/u obesity an wt gain - lost wt. F/u fatigue,  F/u "body heat"  Wt Readings from Last 3 Encounters:  09/15/14 299 lb (135.626 kg)  08/24/14 308 lb (139.708 kg)  06/15/14 313 lb (141.976 kg)   BP Readings from Last 3 Encounters:  09/15/14 130/86  08/24/14 120/82  06/15/14 130/78       Review of Systems  Constitutional: Negative for chills, diaphoresis, activity change, appetite change and unexpected weight change.  HENT: Negative for dental problem, ear pain, hearing loss, mouth sores, postnasal drip, sinus pressure, sneezing and voice change.   Eyes: Negative for visual disturbance.  Respiratory: Negative for chest tightness, wheezing and stridor.   Cardiovascular: Negative for palpitations and leg swelling.  Gastrointestinal: Negative for nausea, blood in stool, abdominal distention and rectal pain.  Genitourinary: Negative for hematuria, decreased urine volume, vaginal bleeding, vaginal discharge, difficulty urinating, vaginal pain and menstrual problem.  Musculoskeletal: Positive for back pain. Negative for joint swelling, gait problem and neck pain.  Skin: Negative for color change and wound.  Neurological: Negative for dizziness, tremors, syncope, speech difficulty and light-headedness.  Hematological: Negative for adenopathy.  Psychiatric/Behavioral: Negative for suicidal ideas, hallucinations, behavioral problems, confusion, sleep disturbance, self-injury, dysphoric mood and decreased concentration. The patient is nervous/anxious. The patient is not hyperactive.        Objective:   Physical Exam  Constitutional: She appears well-developed. No  distress.  Obese  HENT:  Head: Normocephalic.  Right Ear: External ear normal.  Left Ear: External ear normal.  Nose: Nose normal.  Mouth/Throat: Oropharynx is clear and moist.  Eyes: Conjunctivae are normal. Pupils are equal, round, and reactive to light. Right eye exhibits no discharge. Left eye exhibits no discharge.  Neck: Normal range of motion. Neck supple. No JVD present. No tracheal deviation present. No thyromegaly present.  Cardiovascular: Normal rate, regular rhythm and normal heart sounds.   Pulmonary/Chest: No stridor. No respiratory distress. She has no wheezes.  Abdominal: Soft. Bowel sounds are normal. She exhibits no distension and no mass. There is no tenderness. There is no rebound and no guarding.  Musculoskeletal: She exhibits no edema or tenderness.  Lymphadenopathy:    She has no cervical adenopathy.  Neurological: She displays normal reflexes. No cranial nerve deficit. She exhibits normal muscle tone. Coordination normal.  Skin: No rash noted. No erythema.  Psychiatric: Her behavior is normal. Judgment and thought content normal.  Sad  eryth papules on trunk and extr-s 2-3 mm   Lab Results  Component Value Date   WBC 6.0 06/08/2014   HGB 14.1 01/14/2014   HCT 43 06/08/2014   PLT 236.0 01/14/2014   GLUCOSE 81 08/24/2014   CHOL 188 01/14/2014   TRIG 158.0* 01/14/2014   HDL 55.10 01/14/2014   LDLDIRECT 145.5 09/12/2007   LDLCALC 101* 01/14/2014   ALT 13 08/24/2014   AST 14 08/24/2014   NA 138 08/24/2014   K 4.7 08/24/2014   CL 103 08/24/2014   CREATININE 0.86 08/24/2014   BUN 9 08/24/2014   CO2 28 08/24/2014   TSH 0.767 06/08/2014   HGBA1C 6.2 01/14/2014         Assessment &  Plan:

## 2014-09-15 NOTE — Progress Notes (Deleted)
Pre visit review using our clinic review tool, if applicable. No additional management support is needed unless otherwise documented below in the visit note. 

## 2014-09-15 NOTE — Assessment & Plan Note (Signed)
Wt Readings from Last 3 Encounters:  09/15/14 299 lb (135.626 kg)  08/24/14 308 lb (139.708 kg)  06/15/14 313 lb (141.976 kg)

## 2014-10-27 ENCOUNTER — Telehealth: Payer: Self-pay | Admitting: Internal Medicine

## 2014-10-27 NOTE — Telephone Encounter (Signed)
Pt called stated she tend to fall as sleep while using the computer. Pt stated she check her sugar level and it is within the normal rang, pt is concern not sure what is going on. Offer an appt with one of the provider but pt just request for [place] to send a massage to see what Dr. Camila Li think. Please advise.

## 2014-10-28 NOTE — Telephone Encounter (Signed)
Is she using a CPAP? Thx

## 2014-10-30 ENCOUNTER — Telehealth: Payer: Self-pay | Admitting: *Deleted

## 2014-10-30 NOTE — Telephone Encounter (Signed)
OK to fill this prescription with additional refills x0 Thank you!  

## 2014-10-30 NOTE — Telephone Encounter (Signed)
Rf req for Clonazepam 1 mg tid prn #270. Last dispensed 07/24/14. Ok to Rf?

## 2014-11-02 MED ORDER — CLONAZEPAM 1 MG PO TABS: ORAL_TABLET | ORAL | Status: DC

## 2014-11-02 NOTE — Telephone Encounter (Signed)
Called [facility] spoke with Colletta [place] gave md approval.../lmb

## 2014-11-03 NOTE — Telephone Encounter (Signed)
She has CPAP. She has not been using it because she recently moved and lost a part. She just got the part back and will start using it again tonight.

## 2014-11-05 NOTE — Telephone Encounter (Signed)
Noted. Thx.

## 2014-11-10 ENCOUNTER — Telehealth: Payer: Self-pay

## 2014-11-10 NOTE — Telephone Encounter (Signed)
Fax for refill request of clonazepam.   Last printed 11/03/14. [facility] is the requesting pharmacy.   Chart notes indicated that the rx was sent to [facility].   Called pt and pt wants it at [facility]. Deleted the other 2 pharmacies listed in demographics so that this does not come up again.   Called in clonazepam rx to [facility].

## 2014-11-18 ENCOUNTER — Encounter: Payer: Self-pay | Admitting: Internal Medicine

## 2014-11-19 ENCOUNTER — Other Ambulatory Visit: Payer: Self-pay | Admitting: Internal Medicine

## 2014-11-19 MED ORDER — CLONIDINE HCL 0.1 MG PO TABS: ORAL_TABLET | ORAL | Status: DC

## 2014-12-07 ENCOUNTER — Emergency Department (HOSPITAL_COMMUNITY)
Admission: EM | Admit: 2014-12-07 | Discharge: 2014-12-08 | Disposition: A | Discharge status: Home / Self Care | Payer: BLUE CROSS/BLUE SHIELD | Attending: Emergency Medicine | Admitting: Emergency Medicine

## 2014-12-07 ENCOUNTER — Emergency Department (HOSPITAL_COMMUNITY): Payer: BLUE CROSS/BLUE SHIELD

## 2014-12-07 ENCOUNTER — Encounter (HOSPITAL_COMMUNITY): Payer: Self-pay | Admitting: Emergency Medicine

## 2014-12-07 ENCOUNTER — Telehealth: Payer: Self-pay | Admitting: Internal Medicine

## 2014-12-07 DIAGNOSIS — Z7952 Long term (current) use of systemic steroids: Secondary | ICD-10-CM | POA: Insufficient documentation

## 2014-12-07 DIAGNOSIS — R110 Nausea: Secondary | ICD-10-CM | POA: Diagnosis not present

## 2014-12-07 DIAGNOSIS — R0789 Other chest pain: Secondary | ICD-10-CM | POA: Insufficient documentation

## 2014-12-07 DIAGNOSIS — E669 Obesity, unspecified: Secondary | ICD-10-CM | POA: Diagnosis not present

## 2014-12-07 DIAGNOSIS — R079 Chest pain, unspecified: Secondary | ICD-10-CM | POA: Diagnosis present

## 2014-12-07 DIAGNOSIS — Z87891 Personal history of nicotine dependence: Secondary | ICD-10-CM | POA: Diagnosis not present

## 2014-12-07 DIAGNOSIS — G4733 Obstructive sleep apnea (adult) (pediatric): Secondary | ICD-10-CM | POA: Diagnosis not present

## 2014-12-07 DIAGNOSIS — J45901 Unspecified asthma with (acute) exacerbation: Secondary | ICD-10-CM | POA: Insufficient documentation

## 2014-12-07 DIAGNOSIS — Z79899 Other long term (current) drug therapy: Secondary | ICD-10-CM | POA: Diagnosis not present

## 2014-12-07 DIAGNOSIS — Z9981 Dependence on supplemental oxygen: Secondary | ICD-10-CM | POA: Insufficient documentation

## 2014-12-07 DIAGNOSIS — F329 Major depressive disorder, single episode, unspecified: Secondary | ICD-10-CM | POA: Insufficient documentation

## 2014-12-07 LAB — BASIC METABOLIC PANEL
Anion gap: 8 (ref 5–15)
BUN: 9 mg/dL (ref 6–23)
CO2: 26 mmol/L (ref 19–32)
CREATININE: 0.94 mg/dL (ref 0.50–1.10)
Calcium: 9.4 mg/dL (ref 8.4–10.5)
Chloride: 104 mmol/L (ref 96–112)
GFR calc Af Amer: 86 mL/min — ABNORMAL LOW (ref >90)
GFR, EST NON AFRICAN AMERICAN: 74 mL/min — AB (ref >90)
GLUCOSE: 130 mg/dL — AB (ref 70–99)
POTASSIUM: 3.5 mmol/L (ref 3.5–5.1)
Sodium: 138 mmol/L (ref 135–145)

## 2014-12-07 LAB — CBC WITH DIFFERENTIAL/PLATELET
BASOS PCT: 1 % (ref 0–1)
Basophils Absolute: 0 10*3/uL (ref 0.0–0.1)
Eosinophils Absolute: 0.1 10*3/uL (ref 0.0–0.7)
Eosinophils Relative: 1 % (ref 0–5)
HCT: 39.6 % (ref 36.0–46.0)
Hemoglobin: 13 g/dL (ref 12.0–15.0)
Lymphocytes Relative: 31 % (ref 12–46)
Lymphs Abs: 2.6 10*3/uL (ref 0.7–4.0)
MCH: 28.1 pg (ref 26.0–34.0)
MCHC: 32.8 g/dL (ref 30.0–36.0)
MCV: 85.7 fL (ref 78.0–100.0)
MONO ABS: 0.7 10*3/uL (ref 0.1–1.0)
Monocytes Relative: 8 % (ref 3–12)
Neutro Abs: 4.9 10*3/uL (ref 1.7–7.7)
Neutrophils Relative %: 59 % (ref 43–77)
PLATELETS: 292 10*3/uL (ref 150–400)
RBC: 4.62 MIL/uL (ref 3.87–5.11)
RDW: 13.8 % (ref 11.5–15.5)
WBC: 8.3 10*3/uL (ref 4.0–10.5)

## 2014-12-07 LAB — TROPONIN I: Troponin I: <0.03 ng/mL (ref <0.031)

## 2014-12-07 LAB — BRAIN NATRIURETIC PEPTIDE: B Natriuretic Peptide: 12.9 pg/mL (ref 0.0–100.0)

## 2014-12-07 LAB — I-STAT TROPONIN, ED: Troponin i, poc: 0 ng/mL (ref 0.00–0.08)

## 2014-12-07 LAB — D-DIMER, QUANTITATIVE: D-Dimer, Quant: 0.64 ug/mL-FEU — ABNORMAL HIGH (ref 0.00–0.48)

## 2014-12-07 MED ORDER — METHYLPREDNISOLONE SODIUM SUCC 125 MG IJ SOLR
125.0000 mg | Freq: Once | INTRAMUSCULAR | Status: AC
  Administered 2014-12-07: 125 mg via INTRAVENOUS
  Filled 2014-12-07: qty 2

## 2014-12-07 MED ORDER — DIPHENHYDRAMINE HCL 50 MG/ML IJ SOLN
50.0000 mg | Freq: Once | INTRAMUSCULAR | Status: AC
  Administered 2014-12-07: 50 mg via INTRAVENOUS
  Filled 2014-12-07: qty 1

## 2014-12-07 NOTE — ED Notes (Signed)
Pt c/o right sided CP with htn today

## 2014-12-07 NOTE — ED Provider Notes (Signed)
CSN: [id number]     Arrival date & time 12/07/14  1510 History   First MD Initiated Contact with Patient 12/07/14 1951     Chief Complaint  Patient presents with  . Chest Pain     (Consider location/radiation/quality/duration/timing/severity/associated sxs/prior Treatment) Patient is a 44 y.o. female presenting with chest pain.  Chest Pain Pain location:  R chest Pain quality: stabbing   Radiates to: right upper back. Pain severity:  Severe Onset quality:  Gradual Duration:  8 hours Timing:  Constant Progression:  Unchanged Chronicity:  New Context comment:  Began before lunch, then worsened after lunch Relieved by:  Nothing Exacerbated by: not worse with deep inspiration. Associated symptoms: nausea and shortness of breath (" a little bit")   Associated symptoms: not vomiting     Past Medical History  Diagnosis Date  . Depression   . Asthma   . Hypertension   . OSA (obstructive sleep apnea)     on CPAP Dr Gwenette Greet  . Allergy   . Obesity   . Low back pain    Past Surgical History  Procedure Laterality Date  . Tubal ligation    . Endometrial ablation     Family History  Problem Relation Age of Onset  . Cancer Mother     Uterine  . Emphysema Father   . Cancer Maternal Grandfather     Brain  . Arthritis Other   . Heart disease Other     A Fib   History  Substance Use Topics  . Smoking status: Former Research scientist (life sciences)  . Smokeless tobacco: Not on file  . Alcohol Use: No   OB History    No data available     Review of Systems  Respiratory: Positive for shortness of breath (" a little bit").   Cardiovascular: Positive for chest pain.  Gastrointestinal: Positive for nausea. Negative for vomiting.  All other systems reviewed and are negative.     Allergies  Butrans; Ivp dye; and Ropinirole hydrochloride  Home Medications   Prior to Admission medications   Medication Sig Start Date End Date Taking? Authorizing Provider  albuterol (PROVENTIL HFA;VENTOLIN HFA)  108 (90 BASE) MCG/ACT inhaler Inhale 2 puffs into the lungs every 6 (six) hours as needed. 06/14/12   Aleksei Plotnikov V, MD  Cholecalciferol (VITAMIN D3) 1000 UNITS CAPS Take by mouth.      Historical Provider, MD  clonazePAM (KLONOPIN) 1 MG tablet TAKE 1 TABLET BY MOUTH 3 TIMES A DAY AS NEEDED. 11/02/14   Lew Dawes V, MD  cloNIDine (CATAPRES) 0.1 MG tablet Take 1 po bid or tid  x5-7 days, then 1 po qhs x 4-5 days, then stop 11/19/14   Lew Dawes V, MD  cyclobenzaprine (FLEXERIL) 10 MG tablet Take 1 tablet (10 mg total) by mouth 2 (two) times daily. 09/15/14   Aleksei Plotnikov V, MD  DULoxetine (CYMBALTA) 60 MG capsule Take 1 capsule (60 mg total) by mouth daily. 07/21/14   Aleksei Plotnikov V, MD  fentaNYL (DURAGESIC) 12 MCG/HR Place 1 patch (12.5 mcg total) onto the skin every 3 (three) days. 09/15/14   Aleksei Plotnikov V, MD  meloxicam (MOBIC) 15 MG tablet Take 1 tablet (15 mg total) by mouth daily as needed for pain. 03/21/13   Aleksei Plotnikov V, MD  metFORMIN (GLUCOPHAGE) 500 MG tablet Take 1 tablet (500 mg total) by mouth daily with breakfast. 07/21/14   Lew Dawes V, MD  metroNIDAZOLE (METROGEL) 0.75 % gel Apply 1 application topically at bedtime. 01/14/14  Aleksei Plotnikov V, MD  traMADol (ULTRAM) 50 MG tablet TAKE 1 TABLET EVERY 6 HOURS AS NEEDED. 1 TO 2 TABLETS BY MOUTH TWO TIMES A DAY AS NEEDED FOR PAIN. 07/21/14   Cassandria Anger, MD  traZODone (DESYREL) 50 MG tablet TAKE 2 TABLETS BY MOUTH AT BEDTIME. 08/27/14   Aleksei Plotnikov V, MD  triamcinolone cream (KENALOG) 0.1 % Apply 1 application topically 4 (four) times daily. On rash 06/15/14   Aleksei Plotnikov V, MD   BP 129/82 mmHg  Pulse 85  Temp(Src) 98.2 F (36.8 C) (Oral)  Resp 18  SpO2 98%  LMP  Physical Exam  Constitutional: She is oriented to person, place, and time. She appears well-developed and well-nourished. No distress.  HENT:  Head: Normocephalic and atraumatic.  Mouth/Throat: Oropharynx is clear  and moist.  Eyes: Conjunctivae are normal. Pupils are equal, round, and reactive to light. No scleral icterus.  Neck: Neck supple.  Cardiovascular: Normal rate, regular rhythm, normal heart sounds and intact distal pulses.   No murmur heard. Pulmonary/Chest: Effort normal and breath sounds normal. No stridor. No respiratory distress. She has no rales. She exhibits tenderness (right upper anterior chest wall).  Abdominal: Soft. Bowel sounds are normal. She exhibits no distension. There is no tenderness.  Musculoskeletal: Normal range of motion. She exhibits no edema.  Neurological: She is alert and oriented to person, place, and time.  Skin: Skin is warm and dry. No rash noted.  Psychiatric: She has a normal mood and affect. Her behavior is normal.  Nursing note and vitals reviewed.   ED Course  Procedures (including critical care time) Labs Review Labs Reviewed  BASIC METABOLIC PANEL - Abnormal; Notable for the following:    Glucose, Bld 130 (*)    GFR calc non Af Amer 74 (*)    GFR calc Af Amer 86 (*)    All other components within normal limits  D-DIMER, QUANTITATIVE - Abnormal; Notable for the following:    D-Dimer, Quant 0.64 (*)    All other components within normal limits  BRAIN NATRIURETIC PEPTIDE  CBC WITH DIFFERENTIAL/PLATELET  TROPONIN I  Randolm Idol, ED    Imaging Review Dg Chest 2 View  12/07/2014   CLINICAL DATA:  Chest pain.  Shortness of breath.  EXAM: CHEST - 2 VIEW  COMPARISON:  Two-view chest 11/15/2005  FINDINGS: The heart size is normal. The lungs are clear. Chronic interstitial coarsening is stable. No focal airspace consolidation is present. A scratch the degenerative changes of the thoracic spine are stable.  IMPRESSION: Negative two view chest.   Electronically Signed   By: Lawrence Santiago M.D.   On: 12/07/2014 16:12   Ct Angio Chest Pe W/cm &/or Wo Cm  12/08/2014   CLINICAL DATA:  Pressure on the right-sided chest radiating to the back. Minimal  shortness of breath since 1100 hr on 12/07/2014. Patient has a history of contrast reaction with hives and received emergent premedication preparation from the emergency department. Patient had slight contrast reaction with the itching and shortness of breath.  EXAM: CT ANGIOGRAPHY CHEST WITH CONTRAST  TECHNIQUE: Multidetector CT imaging of the chest was performed using the standard protocol during bolus administration of intravenous contrast. Multiplanar CT image reconstructions and MIPs were obtained to evaluate the vascular anatomy.  CONTRAST:  70mL OMNIPAQUE IOHEXOL 350 MG/ML SOLN  COMPARISON:  None.  FINDINGS: Technically adequate study with moderately good opacification of the central and segmental pulmonary arteries. No focal filling defects are demonstrated. No evidence of  significant pulmonary embolus.  Normal heart size. Normal caliber thoracic aorta. No aortic dissection. Great vessel origins are patent. Esophagus is decompressed. No significant lymphadenopathy in the chest.  Evaluation of lungs is limited due to respiratory motion artifact. No evidence of focal consolidation or airspace disease. No pneumothorax. No pleural effusions.  Included portions of the upper abdominal organs are grossly unremarkable. Mild degenerative changes in the spine. No destructive bone lesions.  Review of the MIP images confirms the above findings.  IMPRESSION: No evidence of significant pulmonary embolus. No evidence of active pulmonary disease.   Electronically Signed   By: Lucienne Capers M.D.   On: 12/08/2014 00:52  All radiology studies independently viewed by me.      EKG Interpretation   Date/Time:  Monday December 07 2014 15:17:55 EST Ventricular Rate:  116 PR Interval:  164 QRS Duration: 94 QT Interval:  328 QTC Calculation: 455 R Axis:   -59 Text Interpretation:  Sinus tachycardia Left anterior fascicular block  Cannot rule out Inferior infarct , age undetermined Possible Anterolateral  infarct ,  age undetermined Abnormal ECG No old tracing to compare  Confirmed by [facility]  MD, TREY ([id number]) on 12/07/2014 8:20:12 PM      MDM   Final diagnoses:  Chest pain    44 yo female with right sided chest pain.  Atypical for ACS and two troponins negative.  Unlikely PE, but unable to [facility] due to tachycardia.  DDimer elevated.  CTA obtained and was fortunately negative for PE.  I suspect her pain is musculoskeletal in nature.    She has a history of IV contrast dye allergy.  She described the reaction as getting hives, which resolved with benadryl.  She agreed to proceed with scan after first being pretreated.  Plan to obs briefly to ensure she does not develop symptoms of serious allergic reaction or anaphylaxis.      Houston Siren III, MD 12/09/14 [phone]

## 2014-12-07 NOTE — ED Notes (Signed)
Pt. Reports right shoulder pain that radiates around to right chest and right arm. Pain has been constant since 1430 today.  Pt. Is alert and oriented x4.

## 2014-12-07 NOTE — Telephone Encounter (Signed)
ASE NOTE: All timestamps contained within this report are represented as [place] Standard Time. CONFIDENTIALTY NOTICE: This fax transmission is intended only for the addressee. It contains information that is legally privileged, confidential or otherwise protected from use or disclosure. If you are not the intended recipient, you are strictly prohibited from reviewing, disclosing, copying using or disseminating any of this information or taking any action in reliance on or regarding this information. If you have received this fax in error, please notify us immediately by telephone so that we can arrange for its return to [place]. Phone: [phone], Toll-Free: [phone], Fax: [phone] Page: 1 of 1 Call Id: [id number] [place] [facility] - [facility] [place] Patient Name: Sylvia Alvarado DOB: 12-Feb-1971 Initial Comment Caller states her BP and Pulse is elevated 150/98 and 98. Caller states she is also having chest pain. Nurse Assessment Nurse: Rock Nephew, RN, Juliann Pulse Date/Time (Eastern Time): 12/07/2014 2:45:49 PM Confirm and document reason for call. If symptomatic, describe symptoms. ---Caller states her BP and Pulse is elevated : 150/98 and pulse=98. Caller states she is also having chest pain. Has the patient traveled out of the country within the last 30 days? ---Not Applicable Does the patient require triage? ---Yes Related visit to physician within the last 2 weeks? ---No Does the PT have any chronic conditions? (i.e. diabetes, asthma, etc.) ---Yes List chronic conditions. ---Anxiety, Chronic Back pain Did the patient indicate they were pregnant? ---No Guidelines Guideline Title Affirmed Question Affirmed Notes High Blood Pressure [1] BP # 160 / 100 AND [2] cardiac or neurologic symptoms (e.g., chest pain, difficulty breathing, unsteady gait, blurred vision) Final Disposition User Go to ED Now Rock Nephew, RN, Juliann Pulse

## 2014-12-08 ENCOUNTER — Emergency Department (HOSPITAL_COMMUNITY): Payer: BLUE CROSS/BLUE SHIELD

## 2014-12-08 ENCOUNTER — Telehealth: Payer: Self-pay | Admitting: Internal Medicine

## 2014-12-08 ENCOUNTER — Encounter: Payer: Self-pay | Admitting: Internal Medicine

## 2014-12-08 ENCOUNTER — Ambulatory Visit (INDEPENDENT_AMBULATORY_CARE_PROVIDER_SITE_OTHER): Payer: BLUE CROSS/BLUE SHIELD | Admitting: Internal Medicine

## 2014-12-08 ENCOUNTER — Telehealth: Payer: Self-pay | Admitting: *Deleted

## 2014-12-08 ENCOUNTER — Encounter (HOSPITAL_COMMUNITY): Payer: Self-pay | Admitting: Radiology

## 2014-12-08 ENCOUNTER — Ambulatory Visit: Payer: Self-pay | Admitting: Internal Medicine

## 2014-12-08 VITALS — BP 138/90 | HR 115 | Temp 97.5°F | Wt 298.0 lb

## 2014-12-08 DIAGNOSIS — R079 Chest pain, unspecified: Secondary | ICD-10-CM

## 2014-12-08 MED ORDER — IOHEXOL 350 MG/ML SOLN
80.0000 mL | Freq: Once | INTRAVENOUS | Status: AC | PRN
  Administered 2014-12-08: 80 mL via INTRAVENOUS

## 2014-12-08 NOTE — Telephone Encounter (Signed)
[name] - [facility] [name] Patient Name: Sylvia Alvarado Gender: Female DOB: January 07, 1971 Age: 44 Y 4 M 4 D Return Phone Number: [phone] (Primary) Address: City/State/Zip: [place] [address] Client [facility] - Client Client [facility] Contact Type Call Call Type Triage / Clinical Relationship To Patient Self Appointment Disposition EMR Appointment Not Necessary Return Phone Number [phone] (Primary) Chief Complaint CHEST PAIN (>=21 years) - pain, pressure, heaviness or tightness Initial Comment Caller states her BP and Pulse is elevated 150/98 and 98. Caller states she is also having chest pain. PreDisposition Call Doctor Info pasted into Epic Yes Nurse Assessment Nurse: Rock Nephew, RN, Juliann Pulse Date/Time (Eastern Time): 12/07/2014 2:45:49 PM Confirm and document reason for call. If symptomatic, describe symptoms. ---Caller states her BP and Pulse is elevated : 150/98 and pulse=98. Caller states she is also having chest pain. Has the patient traveled out of the country within the last 30 days? ---Not Applicable Does the patient require triage? ---Yes Related visit to physician within the last 2 weeks? ---No Does the PT have any chronic conditions? (i.e. diabetes, asthma, etc.) ---Yes List chronic conditions. ---Anxiety, Chronic Back pain Did the patient indicate they were pregnant? ---No Guidelines Guideline Title Affirmed Question Affirmed Notes Nurse Date/Time (Eastern Time) High Blood Pressure [1] BP # 160 / 100 AND [2] cardiac or neurologic symptoms (e.g., chest pain, difficulty breathing, unsteady gait, blurred vision) Rock Nephew, RN, Juliann Pulse 12/07/2014 2:47:06 PM Disp. Time Eilene Ghazi Time) Disposition Final User 12/07/2014 2:42:31 PM Send to Urgent Sandi Mealy 12/07/2014 2:48:30 PM Go to ED Now Yes Rock Nephew, RN, Juliann Pulse PLEASE NOTE: All timestamps contained  within this report are represented as [place] Standard Time. CONFIDENTIALTY NOTICE: This fax transmission is intended only for the addressee. It contains information that is legally privileged, confidential or otherwise protected from use or disclosure. If you are not the intended recipient, you are strictly prohibited from reviewing, disclosing, copying using or disseminating any of this information or taking any action in reliance on or regarding this information. If you have received this fax in error, please notify us immediately by telephone so that we can arrange for its return to [place]. Phone: [phone], Toll-Free: [phone], Fax: [phone] Page: 2 of 2 Call Id: [id number] Caller Understands: Yes Disagree/Comply: Comply Care Advice Given Per Guideline GO TO ED NOW: You need to be seen in the Emergency Department. Go to the ER at ___________ [place] now. Drive carefully. * Another adult should drive. CARE ADVICE given per High Blood Pressure (Adult) guideline. CALL EMS 911 IF: * Patient passes out, starts acting confused or becomes too weak to stand. After Care Instructions Given Call Event Type User Date / Time Description Referrals GO TO FACILITY UNDECIDED

## 2014-12-08 NOTE — Progress Notes (Signed)
Pre visit review using our clinic review tool, if applicable. No additional management support is needed unless otherwise documented below in the visit note. 

## 2014-12-08 NOTE — Telephone Encounter (Signed)
Patient requests work note for today. She requests that we post to her mychart .

## 2014-12-08 NOTE — Progress Notes (Signed)
Subjective:    HPI  ER f/u 12/07/14: "44 yo female with right sided chest pain. Atypical for ACS and two troponins negative. Unlikely PE, but unable to [facility] due to tachycardia. D-Dimer elevated. CTA obtained and was fortunately negative for PE. I suspect her pain is musculoskeletal in nature".   She has a history of IV contrast dye allergy. She described the reaction as getting hives, which resolved with benadryl. She agreed to proceed with scan after first being pretreated. Plan to obs briefly to ensure she does not develop symptoms of serious allergic reaction or anaphylaxis.  11/15: Work and school - may grad in May; separated, moved out to live w/her [employer]; dtr 44 yo F/u depression, stress - leaving her husband, dtr starting college Saw her GYN, had an [place] The patient needs to address  Chronic OA, joint pains-worse, depression controlled with medical treatment and diet. F/u obesity an wt gain - lost wt. F/u fatigue,  F/u "body heat"  Wt Readings from Last 3 Encounters:  12/08/14 298 lb (135.172 kg)  09/15/14 299 lb (135.626 kg)  09/15/14 299 lb (135.626 kg)   BP Readings from Last 3 Encounters:  12/08/14 138/90  12/08/14 146/97  09/15/14 130/86       Review of Systems  Constitutional: Negative for chills, diaphoresis, activity change, appetite change and unexpected weight change.  HENT: Negative for dental problem, ear pain, hearing loss, mouth sores, postnasal drip, sinus pressure, sneezing and voice change.   Eyes: Negative for visual disturbance.  Respiratory: Negative for chest tightness, wheezing and stridor.   Cardiovascular: Negative for palpitations and leg swelling.  Gastrointestinal: Negative for nausea, blood in stool, abdominal distention and rectal pain.  Genitourinary: Negative for hematuria, decreased urine volume, vaginal bleeding, vaginal discharge, difficulty urinating, vaginal pain and menstrual problem.  Musculoskeletal: Positive for back pain.  Negative for joint swelling, gait problem and neck pain.  Skin: Negative for color change and wound.  Neurological: Negative for dizziness, tremors, syncope, speech difficulty and light-headedness.  Hematological: Negative for adenopathy.  Psychiatric/Behavioral: Negative for suicidal ideas, hallucinations, behavioral problems, confusion, sleep disturbance, self-injury, dysphoric mood and decreased concentration. The patient is nervous/anxious. The patient is not hyperactive.        Objective:   Physical Exam  Constitutional: She appears well-developed. No distress.  Obese  HENT:  Head: Normocephalic.  Right Ear: External ear normal.  Left Ear: External ear normal.  Nose: Nose normal.  Mouth/Throat: Oropharynx is clear and moist.  Eyes: Conjunctivae are normal. Pupils are equal, round, and reactive to light. Right eye exhibits no discharge. Left eye exhibits no discharge.  Neck: Normal range of motion. Neck supple. No JVD present. No tracheal deviation present. No thyromegaly present.  Cardiovascular: Normal rate, regular rhythm and normal heart sounds.   Pulmonary/Chest: No stridor. No respiratory distress. She has no wheezes.  Abdominal: Soft. Bowel sounds are normal. She exhibits no distension and no mass. There is no tenderness. There is no rebound and no guarding.  Musculoskeletal: She exhibits no edema or tenderness.  Lymphadenopathy:    She has no cervical adenopathy.  Neurological: She displays normal reflexes. No cranial nerve deficit. She exhibits normal muscle tone. Coordination normal.  Skin: No rash noted. No erythema.  Psychiatric: Her behavior is normal. Judgment and thought content normal.  Sad     Lab Results  Component Value Date   WBC 8.3 12/07/2014   HGB 13.0 12/07/2014   HCT 39.6 12/07/2014   PLT 292 12/07/2014  GLUCOSE 130* 12/07/2014   CHOL 188 01/14/2014   TRIG 158.0* 01/14/2014   HDL 55.10 01/14/2014   LDLDIRECT 145.5 09/12/2007   LDLCALC 101*  01/14/2014   ALT 13 08/24/2014   AST 14 08/24/2014   NA 138 12/07/2014   K 3.5 12/07/2014   CL 104 12/07/2014   CREATININE 0.94 12/07/2014   BUN 9 12/07/2014   CO2 26 12/07/2014   TSH 0.767 06/08/2014   HGBA1C 6.2 01/14/2014         Assessment & Plan:

## 2014-12-08 NOTE — Discharge Instructions (Signed)

## 2014-12-08 NOTE — Patient Instructions (Signed)
Wt Readings from Last 3 Encounters:  12/08/14 298 lb (135.172 kg)  09/15/14 299 lb (135.626 kg)  09/15/14 299 lb (135.626 kg)    Low carb diet

## 2014-12-08 NOTE — Assessment & Plan Note (Signed)
1/16 R sided - MI,PE ruled out ? Gallstones  Abd [place]

## 2014-12-11 NOTE — Telephone Encounter (Signed)
Done- see 12/11/14 letter

## 2014-12-30 ENCOUNTER — Other Ambulatory Visit: Payer: Self-pay | Admitting: Internal Medicine

## 2015-01-15 ENCOUNTER — Ambulatory Visit: Payer: BC Managed Care – PPO | Admitting: Family Medicine

## 2015-01-15 ENCOUNTER — Ambulatory Visit: Payer: BC Managed Care – PPO | Admitting: Internal Medicine

## 2015-01-30 ENCOUNTER — Other Ambulatory Visit: Payer: Self-pay | Admitting: Internal Medicine

## 2015-02-23 ENCOUNTER — Telehealth: Payer: Self-pay | Admitting: Internal Medicine

## 2015-02-23 NOTE — Telephone Encounter (Signed)
Did you mean CPX?  OK CBC, TSH, CMET, UA, lipids Thx

## 2015-02-23 NOTE — Telephone Encounter (Signed)
Pt is scheduled for CPR on 03/09/15. She would like to have her labs drawn prior to her visit. She works at [employer] and will have them done there. Fax orders to [phone] attn: Beatrix Shipper. Thank you.

## 2015-02-24 NOTE — Telephone Encounter (Signed)
Generated order fax to fax # given below...Sylvia Alvarado

## 2015-03-09 ENCOUNTER — Ambulatory Visit (INDEPENDENT_AMBULATORY_CARE_PROVIDER_SITE_OTHER): Payer: BLUE CROSS/BLUE SHIELD | Admitting: Family Medicine

## 2015-03-09 ENCOUNTER — Encounter: Payer: Self-pay | Admitting: Internal Medicine

## 2015-03-09 ENCOUNTER — Ambulatory Visit (INDEPENDENT_AMBULATORY_CARE_PROVIDER_SITE_OTHER): Payer: BLUE CROSS/BLUE SHIELD | Admitting: Internal Medicine

## 2015-03-09 ENCOUNTER — Encounter: Payer: Self-pay | Admitting: Family Medicine

## 2015-03-09 ENCOUNTER — Other Ambulatory Visit (INDEPENDENT_AMBULATORY_CARE_PROVIDER_SITE_OTHER): Payer: BLUE CROSS/BLUE SHIELD

## 2015-03-09 VITALS — BP 132/86 | HR 80 | Ht 68.0 in | Wt 294.0 lb

## 2015-03-09 VITALS — BP 110/80 | HR 79 | Temp 97.9°F | Ht 68.0 in | Wt 294.0 lb

## 2015-03-09 DIAGNOSIS — I10 Essential (primary) hypertension: Secondary | ICD-10-CM | POA: Diagnosis not present

## 2015-03-09 DIAGNOSIS — G5600 Carpal tunnel syndrome, unspecified upper limb: Secondary | ICD-10-CM

## 2015-03-09 DIAGNOSIS — Z0000 Encounter for general adult medical examination without abnormal findings: Secondary | ICD-10-CM

## 2015-03-09 MED ORDER — ALBUTEROL SULFATE HFA 108 (90 BASE) MCG/ACT IN AERS: 2.0000 | INHALATION_SPRAY | Freq: Four times a day (QID) | RESPIRATORY_TRACT | Status: DC | PRN

## 2015-03-09 MED ORDER — TRAZODONE HCL 50 MG PO TABS: 50.0000 mg | ORAL_TABLET | Freq: Every day | ORAL | Status: DC

## 2015-03-09 MED ORDER — MELOXICAM 15 MG PO TABS: 15.0000 mg | ORAL_TABLET | Freq: Every day | ORAL | Status: DC | PRN

## 2015-03-09 MED ORDER — CYCLOBENZAPRINE HCL 10 MG PO TABS: 10.0000 mg | ORAL_TABLET | Freq: Two times a day (BID) | ORAL | Status: DC | PRN

## 2015-03-09 MED ORDER — METFORMIN HCL 500 MG PO TABS: 500.0000 mg | ORAL_TABLET | Freq: Every day | ORAL | Status: DC

## 2015-03-09 MED ORDER — DULOXETINE HCL 60 MG PO CPEP: 60.0000 mg | ORAL_CAPSULE | Freq: Every day | ORAL | Status: DC

## 2015-03-09 MED ORDER — TRAMADOL HCL 50 MG PO TABS: 50.0000 mg | ORAL_TABLET | Freq: Two times a day (BID) | ORAL | Status: DC | PRN

## 2015-03-09 MED ORDER — CLONAZEPAM 1 MG PO TABS: ORAL_TABLET | ORAL | Status: DC

## 2015-03-09 MED ORDER — DICLOFENAC SODIUM 2 % TD SOLN: TRANSDERMAL | Status: DC

## 2015-03-09 MED ORDER — METRONIDAZOLE 0.75 % EX GEL: 1.0000 "application " | Freq: Every day | CUTANEOUS | Status: DC

## 2015-03-09 NOTE — Patient Instructions (Signed)
Good to see you Can do injections every 3 months if needed Ice is your friend pennsaid twice daily Brace at night See me when you need me or call.

## 2015-03-09 NOTE — Assessment & Plan Note (Signed)
Patient had repeat injections today. We discussed continuing the home exercises, bracing, icing and patient was given a topical anti-inflammatory to try. In addition of this patient will continue the natural supplementations. Patient knows that we can repeat injections every 3-4 months if necessary in the patient may want to consider surgical intervention if it seems to be worsening affecting her daily activities. Patient understands and will follow-up as needed.

## 2015-03-09 NOTE — Progress Notes (Signed)
Subjective:    HPI   The patient is here for a wellness exam.   11/15: Work and school - may graduate in July; separated, moved out to live w/her [employer]; dtr 44 yo. She is smoking again. TG were high: 298. Pt is smoking again... F/u depression, stress - separated w/ her husband, dtr in college [employer]. Saw her GYN, had an [place] The patient needs to address  Chronic OA, joint pains-worse, depression controlled with medical treatment and diet. Seeing Dr Tamala Julian. F/u obesity an wt gain - lost wt. F/u fatigue,    Wt Readings from Last 3 Encounters:  03/09/15 294 lb (133.358 kg)  12/08/14 298 lb (135.172 kg)  09/15/14 299 lb (135.626 kg)   BP Readings from Last 3 Encounters:  03/09/15 110/80  12/08/14 138/90  12/08/14 146/97       Review of Systems  Constitutional: Negative for chills, diaphoresis, activity change, appetite change and unexpected weight change.  HENT: Negative for dental problem, ear pain, hearing loss, mouth sores, postnasal drip, sinus pressure, sneezing and voice change.   Eyes: Negative for visual disturbance.  Respiratory: Negative for chest tightness, wheezing and stridor.   Cardiovascular: Negative for palpitations and leg swelling.  Gastrointestinal: Negative for nausea, blood in stool, abdominal distention and rectal pain.  Genitourinary: Negative for hematuria, decreased urine volume, vaginal bleeding, vaginal discharge, difficulty urinating, vaginal pain and menstrual problem.  Musculoskeletal: Positive for back pain. Negative for joint swelling, gait problem and neck pain.  Skin: Negative for color change and wound.  Neurological: Negative for dizziness, tremors, syncope, speech difficulty and light-headedness.  Hematological: Negative for adenopathy.  Psychiatric/Behavioral: Negative for suicidal ideas, hallucinations, behavioral problems, confusion, sleep disturbance, self-injury, dysphoric mood and decreased concentration. The patient is nervous/anxious. The  patient is not hyperactive.        Objective:   Physical Exam  Constitutional: She appears well-developed. No distress.  Obese  HENT:  Head: Normocephalic.  Right Ear: External ear normal.  Left Ear: External ear normal.  Nose: Nose normal.  Mouth/Throat: Oropharynx is clear and moist.  Eyes: Conjunctivae are normal. Pupils are equal, round, and reactive to light. Right eye exhibits no discharge. Left eye exhibits no discharge.  Neck: Normal range of motion. Neck supple. No JVD present. No tracheal deviation present. No thyromegaly present.  Cardiovascular: Normal rate, regular rhythm and normal heart sounds.   Pulmonary/Chest: No stridor. No respiratory distress. She has no wheezes.  Abdominal: Soft. Bowel sounds are normal. She exhibits no distension and no mass. There is no tenderness. There is no rebound and no guarding.  Musculoskeletal: She exhibits no edema or tenderness.  Lymphadenopathy:    She has no cervical adenopathy.  Neurological: She displays normal reflexes. No cranial nerve deficit. She exhibits normal muscle tone. Coordination normal.  Skin: No rash noted. No erythema.  Psychiatric: Her behavior is normal. Judgment and thought content normal.  Sad    Lab Results  Component Value Date   WBC 8.3 12/07/2014   HGB 13.0 12/07/2014   HCT 39.6 12/07/2014   PLT 292 12/07/2014   GLUCOSE 130* 12/07/2014   CHOL 188 01/14/2014   TRIG 158.0* 01/14/2014   HDL 55.10 01/14/2014   LDLDIRECT 145.5 09/12/2007   LDLCALC 101* 01/14/2014   ALT 13 08/24/2014   AST 14 08/24/2014   NA 138 12/07/2014   K 3.5 12/07/2014   CL 104 12/07/2014   CREATININE 0.94 12/07/2014   BUN 9 12/07/2014   CO2 26 12/07/2014  TSH 0.767 06/08/2014   HGBA1C 6.2 01/14/2014         Assessment & Plan:

## 2015-03-09 NOTE — Assessment & Plan Note (Signed)
We discussed age appropriate health related issues, including available/recomended screening tests and vaccinations. We discussed a need for adhering to healthy diet and exercise. Labs/EKG were reviewed/ordered. All questions were answered.   

## 2015-03-09 NOTE — Progress Notes (Signed)
Pre visit review using our clinic review tool, if applicable. No additional management support is needed unless otherwise documented below in the visit note. 

## 2015-03-09 NOTE — Progress Notes (Signed)
[facility] [address] [place], [place] [address] Phone: [phone] Subjective:     CC:  Bilateral wrist pain follow up  IRW:[id number] Sylvia Alvarado is a 44 y.o. female coming in for bilateral wrist pain. Patient was seen previously and did have severe carpal tunnel syndrome bilaterally. Patient did have ultrasound guided injections. Patient did have injections back in November. Patient states that she has been doing very well until this last month when patient has noticed that she is having more pain, worsening nighttime awakenings, as well as some weakness in her grip bilaterally.     Past medical history, social, surgical and family history all reviewed in electronic medical record.   Review of Systems: No headache, visual changes, nausea, vomiting, diarrhea, constipation, dizziness, abdominal pain, skin rash, fevers, chills, night sweats, weight loss, swollen lymph nodes, body aches, joint swelling, muscle aches, chest pain, shortness of breath, mood changes.   Objective Blood pressure 132/86, pulse 80, height 5\' 8"  (1.727 m), weight 294 lb (133.358 kg), SpO2 98 %.  General: No apparent distress alert and oriented x3 mood and affect normal, dressed appropriately.  HEENT: Pupils equal, extraocular movements intact  Respiratory: Patient's speak in full sentences and does not appear short of breath  Cardiovascular: No lower extremity edema, non tender, no erythema  Skin: Warm dry intact with no signs of infection or rash on extremities or on axial skeleton.  Abdomen: Soft nontender  Neuro: Cranial nerves II through XII are intact, neurovascularly intact in all extremities with 2+ DTRs and 2+ pulses.  Lymph: No lymphadenopathy of posterior or anterior cervical chain or axillae bilaterally.  Gait normal with good balance and coordination.  MSK:  Non tender with full range of motion and good stability and symmetric strength and tone of shoulders, elbows,   hip, knee and ankles bilaterally.  Wrist: Bilateral Inspection normal with no visible erythema or swelling. ROM smooth and normal with good flexion and extension and ulnar/radial deviation that is symmetrical with opposite wrist. Palpation is normal over metacarpals, navicular, lunate, and TFCC; tendons without tenderness/ swelling No snuffbox tenderness. No tenderness over Canal of Guyon. Strength 5/5 in all directions without pain. Negative Finkelstein, negative tinel's and phalens. Negative Watson's test.  Procedure: Real-time Ultrasound Guided Injection of right carpal tunnel injections  Device: GE Logiq E  Ultrasound guided injection is preferred based studies that show increased duration, increased effect, greater accuracy, decreased procedural pain, increased response rate, and decreased cost with ultrasound guided versus blind injection.  Verbal informed consent obtained.  Time-out conducted.  Noted no overlying erythema, induration, or other signs of local infection.  Skin prepped in a sterile fashion.  Local anesthesia: Topical Ethyl chloride.  With sterile technique and under real time ultrasound guidance: Patient had this procedure done with ultrasound guidance. With a 25-gauge 1 inch needle patient was injected with 0.5 cc of 0.5% Marcaine and 0.5 cc of Kenalog 40 mg/dL into the median nerve. Completed without difficulty  Pain immediately resolved suggesting accurate placement of the medication.  Advised to call if fevers/chills, erythema, induration, drainage, or persistent bleeding.  Images permanently stored and available for review in the ultrasound unit.  Impression: Technically successful ultrasound guided injection.  Procedure: Real-time Ultrasound Guided Injection of leftcarpal tunnel injections  Device: GE Logiq E  Ultrasound guided injection is preferred based studies that show increased duration, increased effect, greater accuracy, decreased  procedural pain, increased response rate, and decreased cost with ultrasound guided versus  blind injection.  Verbal informed consent obtained.  Time-out conducted.  Noted no overlying erythema, induration, or other signs of local infection.  Skin prepped in a sterile fashion.  Local anesthesia: Topical Ethyl chloride.  With sterile technique and under real time ultrasound guidance: Patient had this procedure done with ultrasound guidance. With a 25-gauge 1 inch needle patient was injected with 0.5 cc of 0.5% Marcaine and 0.5 cc of Kenalog 40 mg/dL into the median nerve. Completed without difficulty  Pain immediately resolved suggesting accurate placement of the medication.  Advised to call if fevers/chills, erythema, induration, drainage, or persistent bleeding.  Images permanently stored and available for review in the ultrasound unit.  Impression: Technically successful ultrasound guided injection.   Impression and Recommendations:     This case required medical decision making of moderate complexity.

## 2015-03-09 NOTE — Assessment & Plan Note (Signed)
Doing well on diet - NAS, wt loss

## 2015-03-10 ENCOUNTER — Telehealth: Payer: Self-pay | Admitting: *Deleted

## 2015-03-10 NOTE — Telephone Encounter (Signed)
Per MD- recent labs from [facility] show her Triglycerides are elevated and Hgb A1c shows borderline diabetes. Plan as discussed at her last OV. Called pt- no answer. Left mess for patient to call back.

## 2015-03-16 NOTE — Telephone Encounter (Signed)
Patient has called back. She can be reached at work at ext [phone]

## 2015-03-18 NOTE — Telephone Encounter (Signed)
Pt informed

## 2015-07-06 ENCOUNTER — Other Ambulatory Visit: Payer: Self-pay | Admitting: Internal Medicine

## 2015-10-05 ENCOUNTER — Other Ambulatory Visit: Payer: Self-pay | Admitting: Internal Medicine

## 2015-12-11 ENCOUNTER — Other Ambulatory Visit: Payer: Self-pay | Admitting: Internal Medicine

## 2015-12-13 NOTE — Telephone Encounter (Signed)
Please advise, thanks.

## 2015-12-17 NOTE — Telephone Encounter (Signed)
Rx for Klonopin faxed into pharmacy

## 2015-12-17 NOTE — Telephone Encounter (Signed)
Patient called to follow up on this request states that she has been out of the medication for two days. Please follow up.

## 2015-12-24 ENCOUNTER — Other Ambulatory Visit: Payer: Self-pay | Admitting: Internal Medicine

## 2015-12-31 ENCOUNTER — Other Ambulatory Visit (INDEPENDENT_AMBULATORY_CARE_PROVIDER_SITE_OTHER): Payer: BLUE CROSS/BLUE SHIELD

## 2015-12-31 ENCOUNTER — Ambulatory Visit (INDEPENDENT_AMBULATORY_CARE_PROVIDER_SITE_OTHER): Payer: BLUE CROSS/BLUE SHIELD | Admitting: Internal Medicine

## 2015-12-31 ENCOUNTER — Encounter: Payer: Self-pay | Admitting: Internal Medicine

## 2015-12-31 VITALS — BP 130/84 | HR 85 | Ht 68.0 in | Wt 270.0 lb

## 2015-12-31 DIAGNOSIS — R7309 Other abnormal glucose: Secondary | ICD-10-CM

## 2015-12-31 DIAGNOSIS — I10 Essential (primary) hypertension: Secondary | ICD-10-CM

## 2015-12-31 DIAGNOSIS — Z23 Encounter for immunization: Secondary | ICD-10-CM

## 2015-12-31 DIAGNOSIS — E669 Obesity, unspecified: Secondary | ICD-10-CM

## 2015-12-31 DIAGNOSIS — Z0000 Encounter for general adult medical examination without abnormal findings: Secondary | ICD-10-CM

## 2015-12-31 LAB — BASIC METABOLIC PANEL
BUN: 18 mg/dL (ref 6–23)
CHLORIDE: 105 meq/L (ref 96–112)
CO2: 30 meq/L (ref 19–32)
CREATININE: 1.15 mg/dL (ref 0.40–1.20)
Calcium: 9.3 mg/dL (ref 8.4–10.5)
GFR: 54.89 mL/min — ABNORMAL LOW (ref >60.00)
Glucose, Bld: 75 mg/dL (ref 70–99)
POTASSIUM: 3.8 meq/L (ref 3.5–5.1)
SODIUM: 141 meq/L (ref 135–145)

## 2015-12-31 LAB — CBC WITH DIFFERENTIAL/PLATELET
BASOS PCT: 0.4 % (ref 0.0–3.0)
Basophils Absolute: 0 10*3/uL (ref 0.0–0.1)
EOS ABS: 0.1 10*3/uL (ref 0.0–0.7)
EOS PCT: 1.2 % (ref 0.0–5.0)
HEMATOCRIT: 39.5 % (ref 36.0–46.0)
HEMOGLOBIN: 13.2 g/dL (ref 12.0–15.0)
LYMPHS PCT: 25.5 % (ref 12.0–46.0)
Lymphs Abs: 2.2 10*3/uL (ref 0.7–4.0)
MCHC: 33.6 g/dL (ref 30.0–36.0)
MCV: 86.3 fl (ref 78.0–100.0)
MONOS PCT: 6.6 % (ref 3.0–12.0)
Monocytes Absolute: 0.6 10*3/uL (ref 0.1–1.0)
Neutro Abs: 5.7 10*3/uL (ref 1.4–7.7)
Neutrophils Relative %: 66.3 % (ref 43.0–77.0)
Platelets: 276 10*3/uL (ref 150.0–400.0)
RBC: 4.57 Mil/uL (ref 3.87–5.11)
RDW: 14.8 % (ref 11.5–15.5)
WBC: 8.6 10*3/uL (ref 4.0–10.5)

## 2015-12-31 LAB — VITAMIN D 25 HYDROXY (VIT D DEFICIENCY, FRACTURES): VITD: 24.78 ng/mL — ABNORMAL LOW (ref 30.00–100.00)

## 2015-12-31 LAB — TSH: TSH: 0.42 u[IU]/mL (ref 0.35–4.50)

## 2015-12-31 LAB — HEMOGLOBIN A1C: HEMOGLOBIN A1C: 5.6 % (ref 4.6–6.5)

## 2015-12-31 LAB — RHEUMATOID FACTOR: Rhuematoid fact SerPl-aCnc: <10 IU/mL (ref <=14)

## 2015-12-31 LAB — VITAMIN B12: VITAMIN B 12: 248 pg/mL (ref 211–911)

## 2015-12-31 LAB — URINALYSIS
Bilirubin Urine: NEGATIVE
Hgb urine dipstick: NEGATIVE
Leukocytes, UA: NEGATIVE
Nitrite: NEGATIVE
SPECIFIC GRAVITY, URINE: 1.02 (ref 1.000–1.030)
Total Protein, Urine: NEGATIVE
URINE GLUCOSE: NEGATIVE
UROBILINOGEN UA: 0.2 (ref 0.0–1.0)
pH: 7 (ref 5.0–8.0)

## 2015-12-31 LAB — HEPATIC FUNCTION PANEL
ALBUMIN: 4.1 g/dL (ref 3.5–5.2)
ALT: 12 U/L (ref 0–35)
AST: 16 U/L (ref 0–37)
Alkaline Phosphatase: 51 U/L (ref 39–117)
Bilirubin, Direct: 0.1 mg/dL (ref 0.0–0.3)
TOTAL PROTEIN: 7.3 g/dL (ref 6.0–8.3)
Total Bilirubin: 0.3 mg/dL (ref 0.2–1.2)

## 2015-12-31 MED ORDER — CYCLOBENZAPRINE HCL 10 MG PO TABS: 10.0000 mg | ORAL_TABLET | Freq: Two times a day (BID) | ORAL | Status: DC | PRN

## 2015-12-31 MED ORDER — NAPROXEN 500 MG PO TABS: 500.0000 mg | ORAL_TABLET | Freq: Two times a day (BID) | ORAL | Status: DC | PRN

## 2015-12-31 MED ORDER — TRAMADOL HCL 50 MG PO TABS: 50.0000 mg | ORAL_TABLET | Freq: Two times a day (BID) | ORAL | Status: DC | PRN

## 2015-12-31 MED ORDER — VITAMIN B-12 500 MCG SL SUBL: SUBLINGUAL_TABLET | SUBLINGUAL | Status: DC

## 2015-12-31 MED ORDER — ERGOCALCIFEROL 1.25 MG (50000 UT) PO CAPS: 50000.0000 [IU] | ORAL_CAPSULE | ORAL | Status: DC

## 2015-12-31 MED ORDER — DICLOFENAC SODIUM 2 % TD SOLN: TRANSDERMAL | Status: DC

## 2015-12-31 NOTE — Assessment & Plan Note (Signed)
We discussed age appropriate health related issues, including available/recomended screening tests and vaccinations. We discussed a need for adhering to healthy diet and exercise. Labs/EKG were reviewed/ordered. All questions were answered.   

## 2015-12-31 NOTE — Progress Notes (Signed)
Pre visit review using our clinic review tool, if applicable. No additional management support is needed unless otherwise documented below in the visit note. 

## 2015-12-31 NOTE — Progress Notes (Signed)
Subjective:  Patient ID: Sylvia Alvarado, female    DOB: 12/30/1970  Age: 45 y.o. MRN: [id number]  CC: Annual Exam   HPI Sylvia Alvarado presents for OA, depression, asthma. C/o L knee injury - workman's comp (09/27/2015). She is a blood donor. Pt would like to go off Cymbalta. Well exam  Outpatient Prescriptions Prior to Visit  Medication Sig Dispense Refill  . albuterol (PROVENTIL HFA;VENTOLIN HFA) 108 (90 BASE) MCG/ACT inhaler Inhale 2 puffs into the lungs every 6 (six) hours as needed for wheezing or shortness of breath. 18 g 3  . Cholecalciferol (VITAMIN D3) 1000 UNITS CAPS Take 1,000 Units by mouth daily.     . clonazePAM (KLONOPIN) 1 MG tablet TAKE (1) TABLET BY MOUTH THREE TIMES DAILY AS NEEDED. 30 tablet 0  . cyclobenzaprine (FLEXERIL) 10 MG tablet Take 1 tablet (10 mg total) by mouth 2 (two) times daily as needed for muscle spasms. 180 tablet 0  . Diclofenac Sodium 2 % SOLN Apply 1 pump twice daily. 112 g 3  . DULoxetine (CYMBALTA) 60 MG capsule TAKE (1) CAPSULE BY MOUTH ONCE DAILY. 60 capsule 3  . traMADol (ULTRAM) 50 MG tablet Take 1-2 tablets (50-100 mg total) by mouth every 12 (twelve) hours as needed for moderate pain. 100 tablet 2  . traZODone (DESYREL) 50 MG tablet TAKE (1) TABLET BY MOUTH AT BEDTIME. 90 tablet 0  . Turmeric 450 MG CAPS Take 450 mg by mouth daily.    . cloNIDine (CATAPRES) 0.1 MG tablet TAKE 1 TABLET 2 TO 3 TIMES DAILY FOR 5 TO 7 DAYS THEN 1 AT BEDTIME FOR 4 TO 5 DAYS THEN STOP. (Patient not taking: Reported on 12/31/2015) 30 tablet 5  . meloxicam (MOBIC) 15 MG tablet Take 1 tablet (15 mg total) by mouth daily as needed for pain. (Patient not taking: Reported on 12/31/2015) 90 tablet 1  . metFORMIN (GLUCOPHAGE) 500 MG tablet Take 1 tablet (500 mg total) by mouth daily with breakfast. (Patient not taking: Reported on 12/31/2015) 90 tablet 3  . metroNIDAZOLE (METROGEL) 0.75 % gel Apply 1 application topically at bedtime. (Patient not taking: Reported on 12/31/2015)  45 g 3   No facility-administered medications prior to visit.    ROS Review of Systems  Constitutional: Negative for chills, activity change, appetite change, fatigue and unexpected weight change.  HENT: Negative for congestion, mouth sores and sinus pressure.   Eyes: Negative for visual disturbance.  Respiratory: Negative for cough and chest tightness.   Gastrointestinal: Negative for nausea and abdominal pain.  Genitourinary: Negative for frequency, difficulty urinating and vaginal pain.  Musculoskeletal: Positive for back pain, arthralgias and gait problem.  Skin: Negative for pallor and rash.  Neurological: Negative for dizziness, tremors, weakness, numbness and headaches.  Psychiatric/Behavioral: Negative for suicidal ideas, confusion and sleep disturbance. The patient is not nervous/anxious.     Objective:  BP 130/84 mmHg  Pulse 85  Ht 5\' 8"  (1.727 m)  Wt 270 lb (122.471 kg)  BMI 41.06 kg/m2  SpO2 96%  BP Readings from Last 3 Encounters:  12/31/15 130/84  03/09/15 132/86  03/09/15 110/80    Wt Readings from Last 3 Encounters:  12/31/15 270 lb (122.471 kg)  03/09/15 294 lb (133.358 kg)  03/09/15 294 lb (133.358 kg)    Physical Exam  Constitutional: She appears well-developed. No distress.  HENT:  Head: Normocephalic.  Right Ear: External ear normal.  Left Ear: External ear normal.  Nose: Nose normal.  Mouth/Throat: Oropharynx is clear  and moist.  Eyes: Conjunctivae are normal. Pupils are equal, round, and reactive to light. Right eye exhibits no discharge. Left eye exhibits no discharge.  Neck: Normal range of motion. Neck supple. No JVD present. No tracheal deviation present. No thyromegaly present.  Cardiovascular: Normal rate, regular rhythm and normal heart sounds.   Pulmonary/Chest: No stridor. No respiratory distress. She has no wheezes.  Abdominal: Soft. Bowel sounds are normal. She exhibits no distension and no mass. There is no tenderness. There is no  rebound and no guarding.  Musculoskeletal: She exhibits tenderness. She exhibits no edema.  Lymphadenopathy:    She has no cervical adenopathy.  Neurological: She displays normal reflexes. No cranial nerve deficit. She exhibits normal muscle tone. Coordination normal.  Skin: No rash noted. No erythema.  Psychiatric: She has a normal mood and affect. Her behavior is normal. Judgment and thought content normal.  Obese  Lab Results  Component Value Date   WBC 8.3 12/07/2014   HGB 13.0 12/07/2014   HCT 39.6 12/07/2014   PLT 292 12/07/2014   GLUCOSE 130* 12/07/2014   CHOL 188 01/14/2014   TRIG 158.0* 01/14/2014   HDL 55.10 01/14/2014   LDLDIRECT 145.5 09/12/2007   LDLCALC 101* 01/14/2014   ALT 13 08/24/2014   AST 14 08/24/2014   NA 138 12/07/2014   K 3.5 12/07/2014   CL 104 12/07/2014   CREATININE 0.94 12/07/2014   BUN 9 12/07/2014   CO2 26 12/07/2014   TSH 0.767 06/08/2014   HGBA1C 6.2 01/14/2014    Dg Chest 2 View  12/07/2014  CLINICAL DATA:  Chest pain.  Shortness of breath. EXAM: CHEST - 2 VIEW COMPARISON:  Two-view chest 11/15/2005 FINDINGS: The heart size is normal. The lungs are clear. Chronic interstitial coarsening is stable. No focal airspace consolidation is present. A scratch the degenerative changes of the thoracic spine are stable. IMPRESSION: Negative two view chest. Electronically Signed   By: Lawrence Santiago M.D.   On: 12/07/2014 16:12   Ct Angio Chest Pe W/cm &/or Wo Cm  12/08/2014  CLINICAL DATA:  Pressure on the right-sided chest radiating to the back. Minimal shortness of breath since 1100 hr on 12/07/2014. Patient has a history of contrast reaction with hives and received emergent premedication preparation from the emergency department. Patient had slight contrast reaction with the itching and shortness of breath. EXAM: CT ANGIOGRAPHY CHEST WITH CONTRAST TECHNIQUE: Multidetector CT imaging of the chest was performed using the standard protocol during bolus  administration of intravenous contrast. Multiplanar CT image reconstructions and MIPs were obtained to evaluate the vascular anatomy. CONTRAST:  36mL OMNIPAQUE IOHEXOL 350 MG/ML SOLN COMPARISON:  None. FINDINGS: Technically adequate study with moderately good opacification of the central and segmental pulmonary arteries. No focal filling defects are demonstrated. No evidence of significant pulmonary embolus. Normal heart size. Normal caliber thoracic aorta. No aortic dissection. Great vessel origins are patent. Esophagus is decompressed. No significant lymphadenopathy in the chest. Evaluation of lungs is limited due to respiratory motion artifact. No evidence of focal consolidation or airspace disease. No pneumothorax. No pleural effusions. Included portions of the upper abdominal organs are grossly unremarkable. Mild degenerative changes in the spine. No destructive bone lesions. Review of the MIP images confirms the above findings. IMPRESSION: No evidence of significant pulmonary embolus. No evidence of active pulmonary disease. Electronically Signed   By: Lucienne Capers M.D.   On: 12/08/2014 00:52    Assessment & Plan:   There are no diagnoses linked to this  encounter. I am having Ms. Allen maintain her Vitamin D3, Turmeric, traMADol, metroNIDAZOLE, metFORMIN, meloxicam, cyclobenzaprine, albuterol, Diclofenac Sodium, traZODone, cloNIDine, DULoxetine, and clonazePAM.  No orders of the defined types were placed in this encounter.     Follow-up: No Follow-up on file.  Walker Kehr, MD

## 2016-01-03 ENCOUNTER — Telehealth: Payer: Self-pay

## 2016-01-03 NOTE — Telephone Encounter (Signed)
PA initiated via CoverMyMeds Key [id number]

## 2016-01-05 ENCOUNTER — Telehealth: Payer: Self-pay | Admitting: Internal Medicine

## 2016-01-05 NOTE — Telephone Encounter (Signed)
Naproxen po prn Thx

## 2016-01-05 NOTE — Telephone Encounter (Signed)
Pennsaid has been denied for patient.  Key number for this case [id number]

## 2016-01-05 NOTE — Telephone Encounter (Signed)
PA for Pennsaid denied, please advise on alternative treatment

## 2016-01-05 NOTE — Telephone Encounter (Signed)
See previous note

## 2016-01-07 ENCOUNTER — Encounter: Payer: Self-pay | Admitting: Internal Medicine

## 2016-01-13 ENCOUNTER — Other Ambulatory Visit: Payer: Self-pay | Admitting: Internal Medicine

## 2016-01-14 ENCOUNTER — Telehealth: Payer: Self-pay | Admitting: *Deleted

## 2016-01-14 MED ORDER — DICLOFENAC SODIUM 1 % TD CREA: TOPICAL_CREAM | TRANSDERMAL | Status: DC

## 2016-01-14 NOTE — Telephone Encounter (Signed)
Received call pt states the Pennsaid was not covered by insurance, they would cover diclofenac cream. Inform pt per notes md wanted her to take the naproxen which she stated she is. Inform will send rx for diclofenac cream. Also want to know status on her clonazepam & trazodone refill. Inform her request has been routed to covering md waiting on approval.../lmb

## 2016-01-14 NOTE — Telephone Encounter (Signed)
Please advise in dr plotnikov's absence, thanks

## 2016-01-14 NOTE — Telephone Encounter (Signed)
Rx faxed back to [facility]...Johny Chess

## 2016-01-19 ENCOUNTER — Telehealth: Payer: Self-pay

## 2016-01-19 NOTE — Telephone Encounter (Signed)
PA submitted via cover my meds. Key: [id number]

## 2016-01-19 NOTE — Telephone Encounter (Signed)
Submitted clinical information for PA determination.

## 2016-01-20 NOTE — Telephone Encounter (Signed)
PA approved.   LVM for pt to call back as soon as possible.  RE: PA approved.

## 2016-02-07 ENCOUNTER — Other Ambulatory Visit: Payer: Self-pay | Admitting: Internal Medicine

## 2016-02-07 MED ORDER — CLONAZEPAM 1 MG PO TABS: ORAL_TABLET | ORAL | Status: DC

## 2016-02-07 NOTE — Telephone Encounter (Signed)
Pt left msg on triage checking status on refills...Sylvia Alvarado

## 2016-02-08 NOTE — Telephone Encounter (Signed)
Called refill for clonazepam unto pharmacy spoke with Tripp gave md approval.../lmb

## 2016-02-10 ENCOUNTER — Encounter (HOSPITAL_COMMUNITY): Payer: Self-pay | Admitting: Cardiology

## 2016-02-10 ENCOUNTER — Telehealth: Payer: Self-pay | Admitting: Internal Medicine

## 2016-02-10 ENCOUNTER — Emergency Department (HOSPITAL_COMMUNITY)
Admission: EM | Admit: 2016-02-10 | Discharge: 2016-02-10 | Disposition: A | Discharge status: Home / Self Care | Payer: BLUE CROSS/BLUE SHIELD | Attending: Emergency Medicine | Admitting: Emergency Medicine

## 2016-02-10 ENCOUNTER — Emergency Department (HOSPITAL_COMMUNITY): Payer: BLUE CROSS/BLUE SHIELD

## 2016-02-10 DIAGNOSIS — Z87891 Personal history of nicotine dependence: Secondary | ICD-10-CM | POA: Diagnosis not present

## 2016-02-10 DIAGNOSIS — Z79899 Other long term (current) drug therapy: Secondary | ICD-10-CM | POA: Diagnosis not present

## 2016-02-10 DIAGNOSIS — F329 Major depressive disorder, single episode, unspecified: Secondary | ICD-10-CM | POA: Insufficient documentation

## 2016-02-10 DIAGNOSIS — E669 Obesity, unspecified: Secondary | ICD-10-CM | POA: Diagnosis not present

## 2016-02-10 DIAGNOSIS — J111 Influenza due to unidentified influenza virus with other respiratory manifestations: Secondary | ICD-10-CM | POA: Insufficient documentation

## 2016-02-10 DIAGNOSIS — Z791 Long term (current) use of non-steroidal anti-inflammatories (NSAID): Secondary | ICD-10-CM | POA: Diagnosis not present

## 2016-02-10 DIAGNOSIS — J9801 Acute bronchospasm: Secondary | ICD-10-CM | POA: Diagnosis not present

## 2016-02-10 DIAGNOSIS — I10 Essential (primary) hypertension: Secondary | ICD-10-CM | POA: Diagnosis not present

## 2016-02-10 DIAGNOSIS — R05 Cough: Secondary | ICD-10-CM | POA: Diagnosis present

## 2016-02-10 LAB — COMPREHENSIVE METABOLIC PANEL
ALT: 11 U/L — ABNORMAL LOW (ref 14–54)
AST: 19 U/L (ref 15–41)
Albumin: 3.8 g/dL (ref 3.5–5.0)
Alkaline Phosphatase: 59 U/L (ref 38–126)
Anion gap: 8 (ref 5–15)
BUN: 12 mg/dL (ref 6–20)
CHLORIDE: 103 mmol/L (ref 101–111)
CO2: 26 mmol/L (ref 22–32)
Calcium: 8.5 mg/dL — ABNORMAL LOW (ref 8.9–10.3)
Creatinine, Ser: 0.73 mg/dL (ref 0.44–1.00)
Glucose, Bld: 95 mg/dL (ref 65–99)
POTASSIUM: 3.7 mmol/L (ref 3.5–5.1)
SODIUM: 137 mmol/L (ref 135–145)
Total Bilirubin: 0.3 mg/dL (ref 0.3–1.2)
Total Protein: 7.1 g/dL (ref 6.5–8.1)

## 2016-02-10 LAB — CBC WITH DIFFERENTIAL/PLATELET
BASOS ABS: 0 10*3/uL (ref 0.0–0.1)
Basophils Relative: 0 %
EOS ABS: 0.1 10*3/uL (ref 0.0–0.7)
EOS PCT: 1 %
HCT: 34.9 % — ABNORMAL LOW (ref 36.0–46.0)
Hemoglobin: 11.4 g/dL — ABNORMAL LOW (ref 12.0–15.0)
LYMPHS PCT: 15 %
Lymphs Abs: 0.8 10*3/uL (ref 0.7–4.0)
MCH: 28.4 pg (ref 26.0–34.0)
MCHC: 32.7 g/dL (ref 30.0–36.0)
MCV: 86.8 fL (ref 78.0–100.0)
MONO ABS: 0.4 10*3/uL (ref 0.1–1.0)
Monocytes Relative: 7 %
Neutro Abs: 4.1 10*3/uL (ref 1.7–7.7)
Neutrophils Relative %: 77 %
PLATELETS: 208 10*3/uL (ref 150–400)
RBC: 4.02 MIL/uL (ref 3.87–5.11)
RDW: 13.9 % (ref 11.5–15.5)
WBC: 5.4 10*3/uL (ref 4.0–10.5)

## 2016-02-10 LAB — INFLUENZA PANEL BY PCR (TYPE A & B)
H1N1 flu by pcr: NOT DETECTED
INFLAPCR: NEGATIVE
INFLBPCR: POSITIVE — AB

## 2016-02-10 MED ORDER — IPRATROPIUM-ALBUTEROL 0.5-2.5 (3) MG/3ML IN SOLN
3.0000 mL | Freq: Once | RESPIRATORY_TRACT | Status: AC
  Administered 2016-02-10: 3 mL via RESPIRATORY_TRACT
  Filled 2016-02-10: qty 3

## 2016-02-10 MED ORDER — KETOROLAC TROMETHAMINE 30 MG/ML IJ SOLN
30.0000 mg | Freq: Once | INTRAMUSCULAR | Status: AC
  Administered 2016-02-10: 30 mg via INTRAVENOUS
  Filled 2016-02-10: qty 1

## 2016-02-10 MED ORDER — PREDNISONE 50 MG PO TABS
60.0000 mg | ORAL_TABLET | Freq: Once | ORAL | Status: AC
  Administered 2016-02-10: 60 mg via ORAL
  Filled 2016-02-10: qty 1

## 2016-02-10 MED ORDER — OXYCODONE-ACETAMINOPHEN 5-325 MG PO TABS
1.0000 | ORAL_TABLET | Freq: Once | ORAL | Status: AC
  Administered 2016-02-10: 1 via ORAL
  Filled 2016-02-10: qty 1

## 2016-02-10 MED ORDER — ONDANSETRON HCL 4 MG/2ML IJ SOLN
4.0000 mg | Freq: Once | INTRAMUSCULAR | Status: AC
  Administered 2016-02-10: 4 mg via INTRAVENOUS
  Filled 2016-02-10: qty 2

## 2016-02-10 MED ORDER — PREDNISONE 20 MG PO TABS: ORAL_TABLET | ORAL | Status: DC

## 2016-02-10 MED ORDER — SODIUM CHLORIDE 0.9 % IV BOLUS (SEPSIS)
1000.0000 mL | Freq: Once | INTRAVENOUS | Status: AC
  Administered 2016-02-10: 1000 mL via INTRAVENOUS
  Filled 2016-02-10: qty 1000

## 2016-02-10 MED ORDER — OXYCODONE-ACETAMINOPHEN 5-325 MG PO TABS: 1.0000 | ORAL_TABLET | Freq: Four times a day (QID) | ORAL | Status: DC | PRN

## 2016-02-10 MED ORDER — ALBUTEROL SULFATE (2.5 MG/3ML) 0.083% IN NEBU
2.5000 mg | INHALATION_SOLUTION | Freq: Once | RESPIRATORY_TRACT | Status: AC
  Administered 2016-02-10: 2.5 mg via RESPIRATORY_TRACT
  Filled 2016-02-10: qty 3

## 2016-02-10 NOTE — ED Provider Notes (Signed)
CSN: [id number]     Arrival date & time 02/10/16  1324 History   First MD Initiated Contact with Patient 02/10/16 1440     Chief Complaint  Patient presents with  . Cough     (Consider location/radiation/quality/duration/timing/severity/associated sxs/prior Treatment) Patient is a 45 y.o. female presenting with cough. The history is provided by the patient (Patient complains of cough and congestion with body aches for 3 days).  Cough Cough characteristics:  Non-productive Severity:  Moderate Onset quality:  Sudden Timing:  Constant Progression:  Waxing and waning Chronicity:  New Associated symptoms: no chest pain, no eye discharge, no headaches and no rash     Past Medical History  Diagnosis Date  . Depression   . Asthma   . Hypertension   . OSA (obstructive sleep apnea)     on CPAP Dr Gwenette Greet  . Allergy   . Obesity   . Low back pain    Past Surgical History  Procedure Laterality Date  . Tubal ligation    . Endometrial ablation     Family History  Problem Relation Age of Onset  . Cancer Mother     Uterine  . Emphysema Father   . Cancer Maternal Grandfather     Brain  . Arthritis Other   . Heart disease Other     A Fib   Social History  Substance Use Topics  . Smoking status: Former Research scientist (life sciences)  . Smokeless tobacco: None  . Alcohol Use: No   OB History    No data available     Review of Systems  Constitutional: Negative for appetite change and fatigue.  HENT: Negative for congestion, ear discharge and sinus pressure.   Eyes: Negative for discharge.  Respiratory: Positive for cough.   Cardiovascular: Negative for chest pain.  Gastrointestinal: Negative for abdominal pain and diarrhea.  Genitourinary: Negative for frequency and hematuria.  Musculoskeletal: Negative for back pain.  Skin: Negative for rash.  Neurological: Negative for seizures and headaches.  Psychiatric/Behavioral: Negative for hallucinations.      Allergies  Ivp dye; Butrans; and  Ropinirole hydrochloride  Home Medications   Prior to Admission medications   Medication Sig Start Date End Date Taking? Authorizing Provider  Cholecalciferol (VITAMIN D3) 1000 UNITS CAPS Take 1,000 Units by mouth daily.    Yes Historical Provider, MD  clonazePAM (KLONOPIN) 1 MG tablet TAKE (1) TABLET BY MOUTH THREE TIMES DAILY AS NEEDED. Patient taking differently: Take 1 mg by mouth 3 (three) times daily as needed for anxiety.  02/07/16  Yes Aleksei Plotnikov V, MD  Cyanocobalamin (VITAMIN B-12) 500 MCG SUBL 1 sl qd Patient taking differently: Place 500 mcg under the tongue daily.  12/31/15  Yes Aleksei Plotnikov V, MD  DULoxetine (CYMBALTA) 60 MG capsule TAKE (1) CAPSULE BY MOUTH ONCE DAILY. 12/17/15  Yes Aleksei Plotnikov V, MD  metFORMIN (GLUCOPHAGE) 500 MG tablet Take 1 tablet (500 mg total) by mouth daily with breakfast. 03/09/15  Yes Aleksei Plotnikov V, MD  naproxen (NAPROSYN) 500 MG tablet Take 1 tablet (500 mg total) by mouth 2 (two) times daily as needed for moderate pain. 12/31/15  Yes Aleksei Plotnikov V, MD  traZODone (DESYREL) 50 MG tablet TAKE (1) TABLET BY MOUTH AT BEDTIME. 01/14/16  Yes Golden Circle, FNP  Turmeric 450 MG CAPS Take 450 mg by mouth daily.   Yes Historical Provider, MD  albuterol (PROVENTIL HFA;VENTOLIN HFA) 108 (90 BASE) MCG/ACT inhaler Inhale 2 puffs into the lungs every 6 (six) hours as  needed for wheezing or shortness of breath. 03/09/15   Aleksei Plotnikov V, MD  cloNIDine (CATAPRES) 0.1 MG tablet TAKE 1 TABLET 2 TO 3 TIMES DAILY FOR 5 TO 7 DAYS THEN 1 AT BEDTIME FOR 4 TO 5 DAYS THEN STOP. Patient not taking: Reported on 12/31/2015 10/06/15   Cassandria Anger, MD  cyclobenzaprine (FLEXERIL) 10 MG tablet Take 1 tablet (10 mg total) by mouth 2 (two) times daily as needed for muscle spasms. 12/31/15   Aleksei Plotnikov V, MD  diclofenac sodium (VOLTAREN) 1 % GEL APPLY TO AFFECTED AREAS TWICE DAILY AS NEEDED. Patient taking differently: APPLY TO AFFECTED AREAS TWICE  DAILY AS NEEDED PAIN 02/07/16   Lew Dawes V, MD  Diclofenac Sodium 1 % CREA Apply on affected area twice a day as needed Patient not taking: Reported on 02/10/2016 01/14/16   Aleksei Plotnikov V, MD  metroNIDAZOLE (METROGEL) 0.75 % gel Apply 1 application topically at bedtime. Patient not taking: Reported on 12/31/2015 03/09/15   Cassandria Anger, MD  oxyCODONE-acetaminophen (PERCOCET/ROXICET) 5-325 MG tablet Take 1 tablet by mouth every 6 (six) hours as needed. 02/10/16   Milton Ferguson, MD  traMADol (ULTRAM) 50 MG tablet Take 1-2 tablets (50-100 mg total) by mouth every 12 (twelve) hours as needed for moderate pain. 12/31/15   Aleksei Plotnikov V, MD  Vitamin D, Ergocalciferol, (DRISDOL) 50000 units CAPS capsule TAKE 1 CAPSULE BY MOUTH ONCE A WEEK. 02/07/16   Aleksei Plotnikov V, MD   BP 112/64 mmHg  Pulse 86  Temp(Src) 98.9 F (37.2 C) (Oral)  Resp 18  Ht 5\' 7"  (1.702 m)  Wt 275 lb (124.739 kg)  BMI 43.06 kg/m2  SpO2 96% Physical Exam  Constitutional: She is oriented to person, place, and time. She appears well-developed.  HENT:  Head: Normocephalic.  Eyes: Conjunctivae and EOM are normal. No scleral icterus.  Neck: Neck supple. No thyromegaly present.  Cardiovascular: Normal rate and regular rhythm.  Exam reveals no gallop and no friction rub.   No murmur heard. Pulmonary/Chest: No stridor. She has no wheezes. She has no rales. She exhibits no tenderness.  Abdominal: She exhibits no distension. There is no tenderness. There is no rebound.  Musculoskeletal: Normal range of motion. She exhibits no edema.  Lymphadenopathy:    She has no cervical adenopathy.  Neurological: She is oriented to person, place, and time. She exhibits normal muscle tone. Coordination normal.  Skin: No rash noted. No erythema.  Psychiatric: She has a normal mood and affect. Her behavior is normal.    ED Course  Procedures (including critical care time) Labs Review Labs Reviewed  CBC WITH  DIFFERENTIAL/PLATELET - Abnormal; Notable for the following:    Hemoglobin 11.4 (*)    HCT 34.9 (*)    All other components within normal limits  COMPREHENSIVE METABOLIC PANEL - Abnormal; Notable for the following:    Calcium 8.5 (*)    ALT 11 (*)    All other components within normal limits  INFLUENZA PANEL BY PCR (TYPE A & B, H1N1) - Abnormal; Notable for the following:    Influenza B By PCR POSITIVE (*)    All other components within normal limits    Imaging Review Dg Chest 2 View  02/10/2016  CLINICAL DATA:  Cough, chest tightness, wheezing, shortness of breath, headache and sore throat for 3 days, worsening. EXAM: CHEST  2 VIEW COMPARISON:  CT chest 12/08/2014.  PA and lateral chest 12/07/2014. FINDINGS: The lungs are clear. Heart size is normal.  There is no pneumothorax or pleural effusion. No focal bony abnormality. IMPRESSION: Negative chest. Electronically Signed   By: Inge Rise M.D.   On: 02/10/2016 13:50   I have personally reviewed and evaluated these images and lab results as part of my medical decision-making.   EKG Interpretation None      MDM   Final diagnoses:  Influenza  Bronchospasm    Patient with influenza a spasm she sees her albuterol inhaler at home she will be given a short course prednisone and is told replacement fluids Tylenol Motrin as needed    Milton Ferguson, MD 02/10/16 1929

## 2016-02-10 NOTE — Telephone Encounter (Signed)
Patient Name: Sylvia Alvarado DOB: [birth year] Initial Comment Caller states having productive cough, ear and throat pain, sinus congestion, chest congestion, has already taken inhaler, some difficulty breathing Nurse Assessment Nurse: Ronnald Ramp, RN, Miranda Date/Time (Eastern Time): 02/10/2016 9:54:37 AM Confirm and document reason for call. If symptomatic, describe symptoms. You must click the next button to save text entered. ---Caller states she has had a productive cough, sinus congestion, ear pain, and sore throat since Monday night. Denies fever. Used Albuterol inhaler for the first time this morning about 3 hrs ago, with some improvement. Has the patient traveled out of the country within the last 30 days? ---No Does the patient have any new or worsening symptoms? ---Yes Will a triage be completed? ---Yes Related visit to physician within the last 2 weeks? ---No Does the PT have any chronic conditions? (i.e. diabetes, asthma, etc.) ---Yes List chronic conditions. ---Asthma, Depression, Is the patient pregnant or possibly pregnant? (Ask all females between the ages of 67-55) ---No Is this a behavioral health or substance abuse call? ---No Guidelines Guideline Title Affirmed Question Affirmed Notes Asthma Attack [1] Wheezing or coughing AND [2] hasn't used neb or inhaler twice AND [3] it's available Final Disposition User Urgent Home Treatment with Follow-Up Call Ronnald Ramp, RN, Miranda Comments Appt scheduled for tomorrow 3/31 with Lorane Gell at 4:15pm No answer for follow up call. Appt scheduled previously for cold/ cough and ear pain. Referrals GO TO FACILITY OTHER - SPECIFY Disagree/Comply: Comply

## 2016-02-10 NOTE — ED Notes (Signed)
Congestion and sorethroat times 2 days .

## 2016-02-10 NOTE — Discharge Instructions (Signed)
Drink plenty of fluids Tylenol or Motrin for pain.   Follow-up if not improving

## 2016-02-11 ENCOUNTER — Ambulatory Visit: Payer: Self-pay | Admitting: Nurse Practitioner

## 2016-03-03 IMAGING — DX DG CHEST 2V
2 series · 2 of 2 positions shown · non-contrast
Comparison: Two-view chest 11/15/2005

CLINICAL DATA: Chest pain.  Shortness of breath.

EXAM:
CHEST - 2 VIEW

[chest pa]
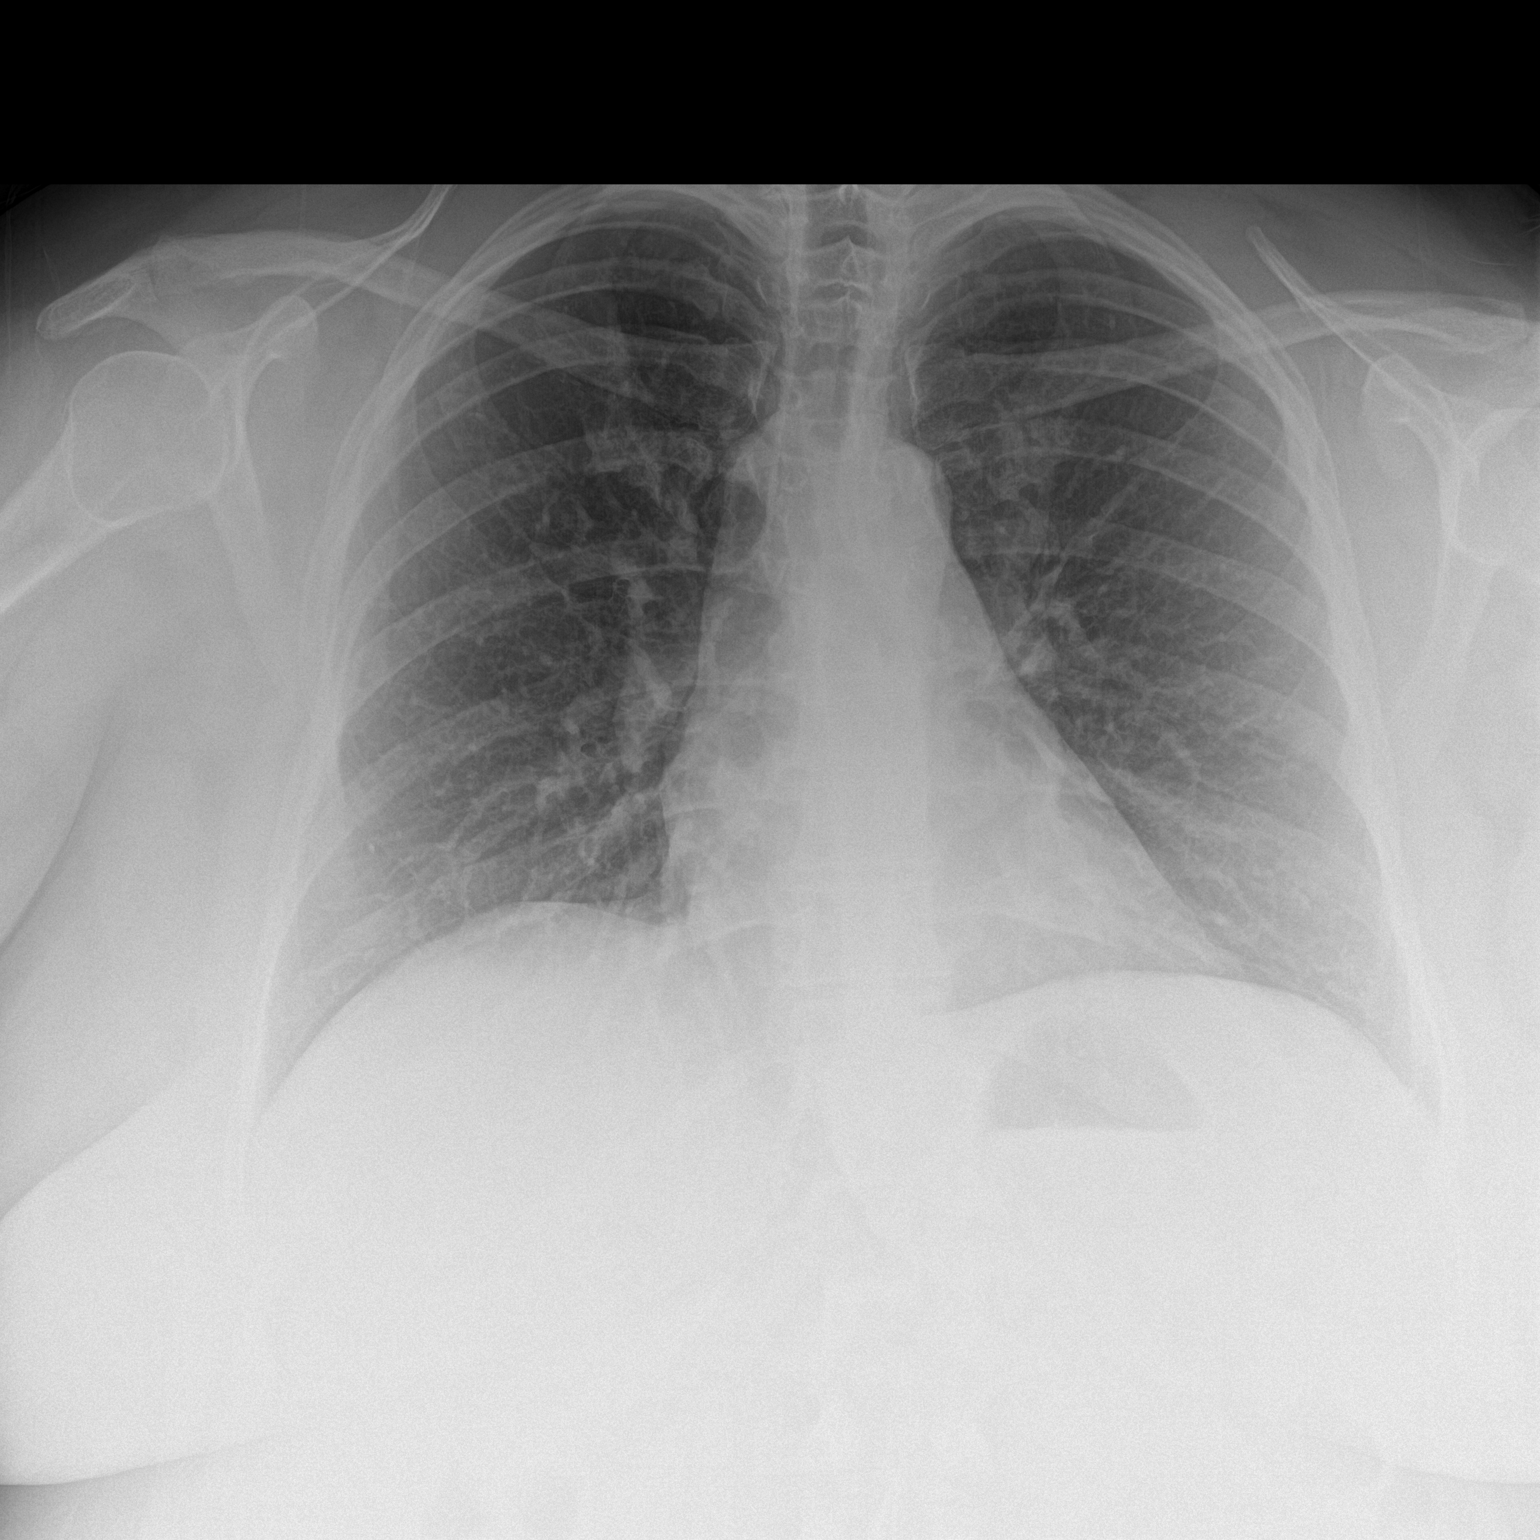

[chest lat]
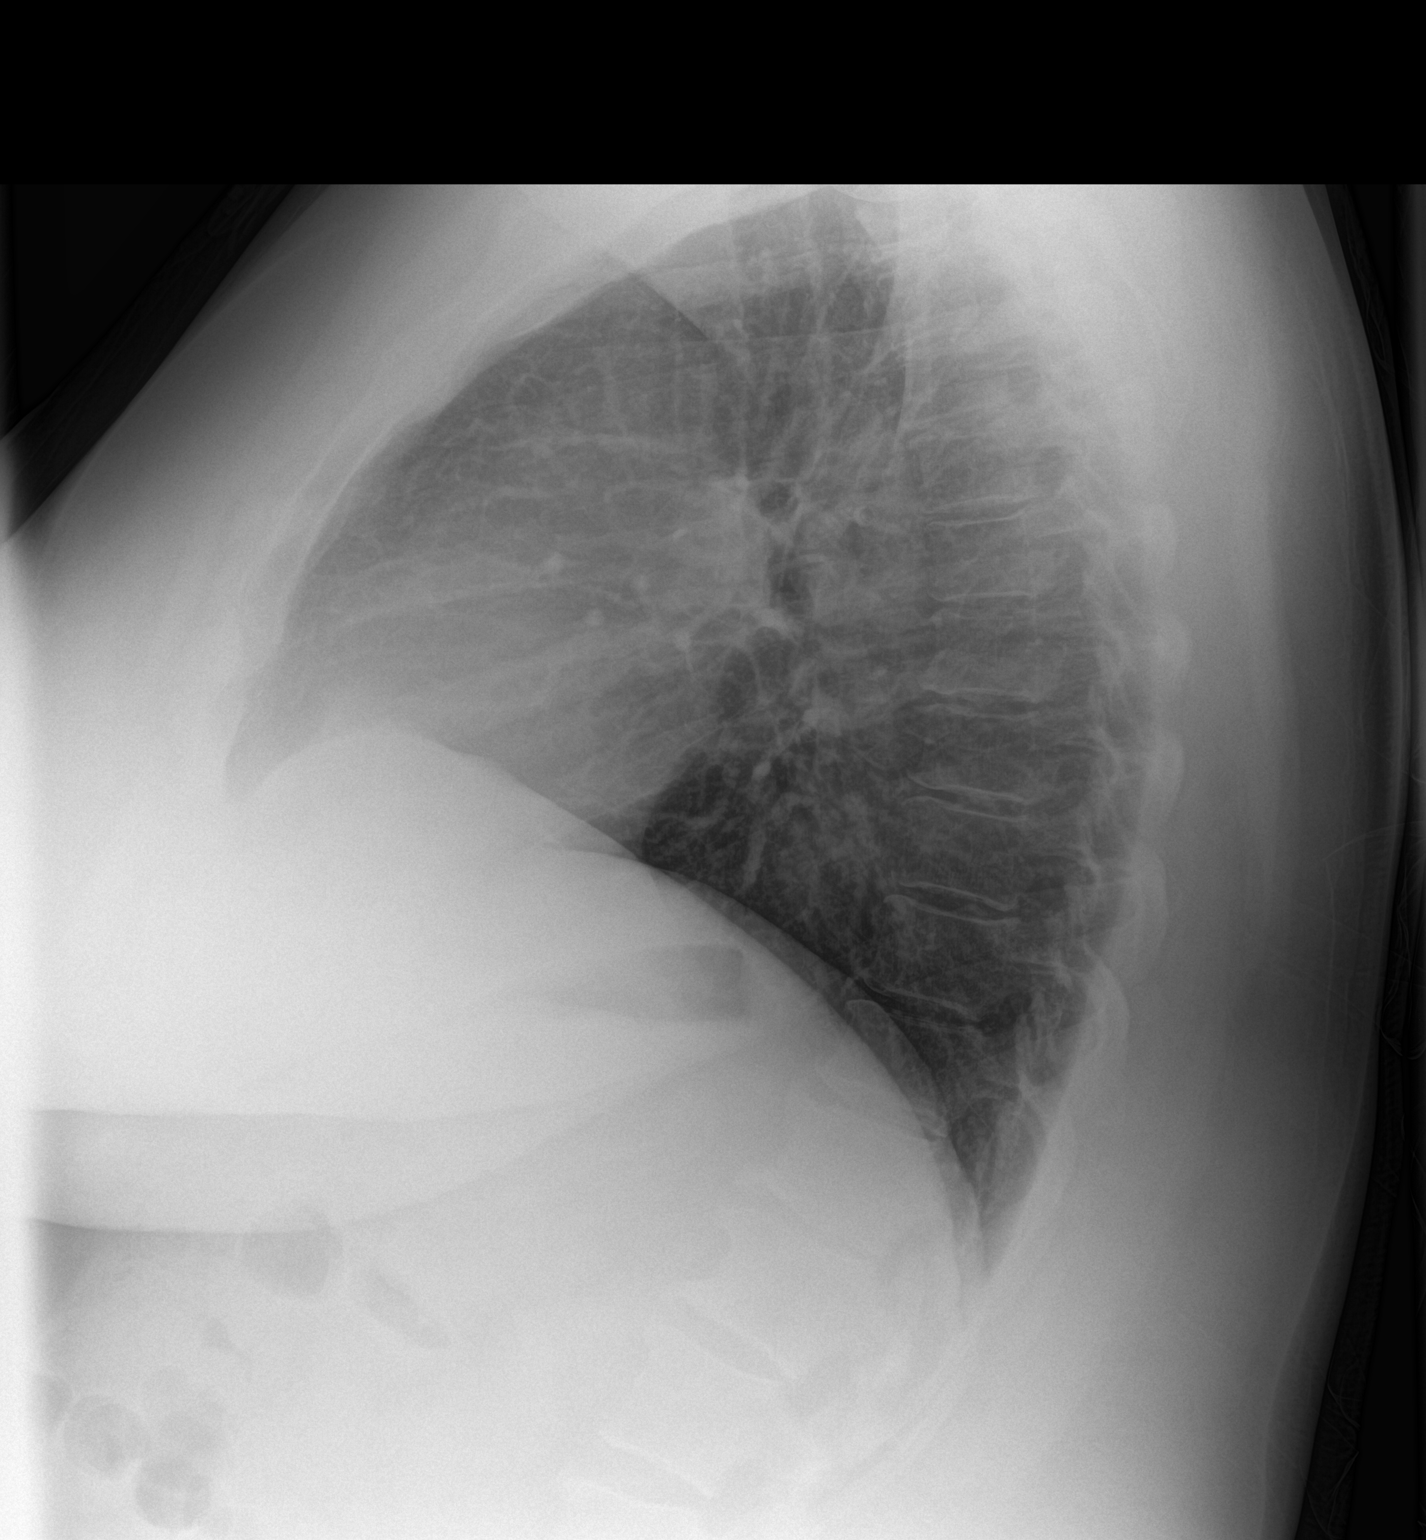

[2 of 2 positions shown; findings below may reference images not displayed]

FINDINGS: The heart size is normal. The lungs are clear. Chronic interstitial
coarsening is stable. No focal airspace consolidation is present. A
scratch the degenerative changes of the thoracic spine are stable.
IMPRESSION: Negative two view chest.

## 2016-03-31 ENCOUNTER — Other Ambulatory Visit: Payer: Self-pay | Admitting: Internal Medicine

## 2016-04-18 ENCOUNTER — Telehealth: Payer: Self-pay | Admitting: *Deleted

## 2016-04-18 MED ORDER — CLONAZEPAM 1 MG PO TABS: ORAL_TABLET | ORAL | Status: DC

## 2016-04-18 MED ORDER — CYCLOBENZAPRINE HCL 10 MG PO TABS: 10.0000 mg | ORAL_TABLET | Freq: Two times a day (BID) | ORAL | Status: DC | PRN

## 2016-04-18 NOTE — Telephone Encounter (Signed)
OK to fill this prescription with additional refills x3 Thank you!  

## 2016-04-18 NOTE — Telephone Encounter (Signed)
Rec'd fax pt requesting refill on her clonazepam 1 Mg. Last filled 05/31/15...Sylvia Alvarado

## 2016-04-18 NOTE — Telephone Encounter (Signed)
Called refill into pharmacy spoke w/Glenn gave md authorization...lmb

## 2016-06-27 ENCOUNTER — Other Ambulatory Visit: Payer: Self-pay | Admitting: Internal Medicine

## 2016-06-30 ENCOUNTER — Ambulatory Visit (INDEPENDENT_AMBULATORY_CARE_PROVIDER_SITE_OTHER): Payer: PRIVATE HEALTH INSURANCE | Admitting: Internal Medicine

## 2016-06-30 ENCOUNTER — Encounter: Payer: Self-pay | Admitting: Internal Medicine

## 2016-06-30 VITALS — BP 118/70 | Temp 98.2°F | Wt 267.0 lb

## 2016-06-30 DIAGNOSIS — Z1379 Encounter for other screening for genetic and chromosomal anomalies: Secondary | ICD-10-CM | POA: Insufficient documentation

## 2016-06-30 DIAGNOSIS — M5440 Lumbago with sciatica, unspecified side: Secondary | ICD-10-CM | POA: Diagnosis not present

## 2016-06-30 DIAGNOSIS — E669 Obesity, unspecified: Secondary | ICD-10-CM

## 2016-06-30 DIAGNOSIS — R030 Elevated blood-pressure reading, without diagnosis of hypertension: Secondary | ICD-10-CM

## 2016-06-30 DIAGNOSIS — M797 Fibromyalgia: Secondary | ICD-10-CM | POA: Diagnosis not present

## 2016-06-30 DIAGNOSIS — F4321 Adjustment disorder with depressed mood: Secondary | ICD-10-CM

## 2016-06-30 DIAGNOSIS — Z315 Encounter for genetic counseling: Secondary | ICD-10-CM

## 2016-06-30 DIAGNOSIS — G8929 Other chronic pain: Secondary | ICD-10-CM

## 2016-06-30 MED ORDER — METFORMIN HCL 500 MG PO TABS: 500.0000 mg | ORAL_TABLET | Freq: Every day | ORAL | 3 refills | Status: DC

## 2016-06-30 MED ORDER — GABAPENTIN 100 MG PO CAPS: ORAL_CAPSULE | ORAL | 5 refills | Status: DC

## 2016-06-30 MED ORDER — CLONAZEPAM 1 MG PO TABS: ORAL_TABLET | ORAL | 3 refills | Status: DC

## 2016-06-30 NOTE — Assessment & Plan Note (Signed)
Nl BP °

## 2016-06-30 NOTE — Progress Notes (Signed)
Subjective:  Patient ID: Sylvia Alvarado, female    DOB: May 26, 1971  Age: 45 y.o. MRN: [id number]  CC: No chief complaint on file.   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 Pt's mom has a 50% chance - not tested yet.    Outpatient Medications Prior to Visit  Medication Sig Dispense Refill  . albuterol (PROVENTIL HFA;VENTOLIN HFA) 108 (90 BASE) MCG/ACT inhaler Inhale 2 puffs into the lungs every 6 (six) hours as needed for wheezing or shortness of breath. 18 g 3  . Cholecalciferol (VITAMIN D3) 1000 UNITS CAPS Take 1,000 Units by mouth daily.     . clonazePAM (KLONOPIN) 1 MG tablet TAKE (1) TABLET BY MOUTH THREE TIMES DAILY AS NEEDED. 60 tablet 3  . cloNIDine (CATAPRES) 0.1 MG tablet TAKE 1 TABLET 2 TO 3 TIMES DAILY FOR 5 TO 7 DAYS THEN 1 AT BEDTIME FOR 4 TO 5 DAYS THEN STOP. 30 tablet 5  . Cyanocobalamin (VITAMIN B-12) 500 MCG SUBL 1 sl qd (Patient taking differently: Place 500 mcg under the tongue daily. ) 100 tablet 3  . cyclobenzaprine (FLEXERIL) 10 MG tablet Take 1 tablet (10 mg total) by mouth 2 (two) times daily as needed for muscle spasms. 180 tablet 0  . diclofenac sodium (VOLTAREN) 1 % GEL APPLY TO AFFECTED AREAS TWICE DAILY AS NEEDED. (Patient taking differently: APPLY TO AFFECTED AREAS TWICE DAILY AS NEEDED PAIN) 100 g 1  . Diclofenac Sodium 1 % CREA Apply on affected area twice a day as needed 120 g 0  . DULoxetine (CYMBALTA) 60 MG capsule TAKE (1) CAPSULE BY MOUTH ONCE DAILY. 60 capsule 3  . metFORMIN (GLUCOPHAGE) 500 MG tablet Take 1 tablet (500 mg total) by mouth daily with breakfast. 90 tablet 3  . metroNIDAZOLE (METROGEL) 0.75 % gel Apply 1 application topically at bedtime. 45 g 3  . naproxen (NAPROSYN) 500 MG tablet Take 1 tablet (500 mg total) by mouth 2 (two) times daily as needed for  moderate pain. 60 tablet 5  . oxyCODONE-acetaminophen (PERCOCET/ROXICET) 5-325 MG tablet Take 1 tablet by mouth every 6 (six) hours as needed. 20 tablet 0  . predniSONE (DELTASONE) 20 MG tablet 2 tabs po daily x 3 days 6 tablet 0  . traMADol (ULTRAM) 50 MG tablet Take 1-2 tablets (50-100 mg total) by mouth every 12 (twelve) hours as needed for moderate pain. 100 tablet 2  . traZODone (DESYREL) 50 MG tablet TAKE (1) TABLET BY MOUTH AT BEDTIME. 30 tablet 5  . Turmeric 450 MG CAPS Take 450 mg by mouth daily.    . Vitamin D, Ergocalciferol, (DRISDOL) 50000 units CAPS capsule TAKE 1 CAPSULE BY MOUTH ONCE A WEEK. 6 capsule 2   No facility-administered medications prior to visit.     ROS Review of Systems  Constitutional: Negative for activity change, appetite change, chills, fatigue and unexpected weight change.  HENT: Negative for congestion, mouth sores and sinus pressure.   Eyes: Negative for visual disturbance.  Respiratory: Negative for cough and chest tightness.   Gastrointestinal: Negative for abdominal pain and nausea.  Genitourinary: Negative for difficulty urinating, frequency and vaginal pain.  Musculoskeletal: Negative for back pain and gait problem.  Skin: Negative for pallor and rash.  Neurological: Negative for dizziness, tremors, weakness, numbness and headaches.  Psychiatric/Behavioral: Negative for confusion and sleep  disturbance.    Objective:  BP 118/70   Temp 98.2 F (36.8 C) (Oral)   Wt 267 lb (121.1 kg)   SpO2 94%   BMI 41.82 kg/m   BP Readings from Last 3 Encounters:  06/30/16 118/70  02/10/16 (!) 140/51  12/31/15 130/84    Wt Readings from Last 3 Encounters:  06/30/16 267 lb (121.1 kg)  02/10/16 275 lb (124.7 kg)  12/31/15 270 lb (122.5 kg)    Physical Exam  Constitutional: She appears well-developed. No distress.  HENT:  Head: Normocephalic.  Right Ear: External ear normal.  Left Ear: External ear normal.  Nose: Nose normal.  Mouth/Throat:  Oropharynx is clear and moist.  Eyes: Conjunctivae are normal. Pupils are equal, round, and reactive to light. Right eye exhibits no discharge. Left eye exhibits no discharge.  Neck: Normal range of motion. Neck supple. No JVD present. No tracheal deviation present. No thyromegaly present.  Cardiovascular: Normal rate, regular rhythm and normal heart sounds.   Pulmonary/Chest: No stridor. No respiratory distress. She has no wheezes.  Abdominal: Soft. Bowel sounds are normal. She exhibits no distension and no mass. There is no tenderness. There is no rebound and no guarding.  Musculoskeletal: She exhibits no edema or tenderness.  Lymphadenopathy:    She has no cervical adenopathy.  Neurological: She displays normal reflexes. No cranial nerve deficit. She exhibits normal muscle tone. Coordination normal.  Skin: No rash noted. No erythema.  Psychiatric: She has a normal mood and affect. Her behavior is normal. Judgment and thought content normal.  obese Hands tender  Lab Results  Component Value Date   WBC 5.4 02/10/2016   HGB 11.4 (L) 02/10/2016   HCT 34.9 (L) 02/10/2016   PLT 208 02/10/2016   GLUCOSE 95 02/10/2016   CHOL 188 01/14/2014   TRIG 158.0 (H) 01/14/2014   HDL 55.10 01/14/2014   LDLDIRECT 145.5 09/12/2007   LDLCALC 101 (H) 01/14/2014   ALT 11 (L) 02/10/2016   AST 19 02/10/2016   NA 137 02/10/2016   K 3.7 02/10/2016   CL 103 02/10/2016   CREATININE 0.73 02/10/2016   BUN 12 02/10/2016   CO2 26 02/10/2016   TSH 0.42 12/31/2015   HGBA1C 5.6 12/31/2015    Dg Chest 2 View  Result Date: 02/10/2016 CLINICAL DATA:  Cough, chest tightness, wheezing, shortness of breath, headache and sore throat for 3 days, worsening. EXAM: CHEST  2 VIEW COMPARISON:  CT chest 12/08/2014.  PA and lateral chest 12/07/2014. FINDINGS: The lungs are clear. Heart size is normal. There is no pneumothorax or pleural effusion. No focal bony abnormality. IMPRESSION: Negative chest. Electronically Signed    By: Inge Rise M.D.   On: 02/10/2016 13:50    Assessment & Plan:   There are no diagnoses linked to this encounter. I am having Ms. Allen maintain her Vitamin D3, Turmeric, metroNIDAZOLE, metFORMIN, albuterol, cloNIDine, DULoxetine, traMADol, naproxen, Vitamin B-12, Diclofenac Sodium, diclofenac sodium, Vitamin D (Ergocalciferol), oxyCODONE-acetaminophen, predniSONE, cyclobenzaprine, clonazePAM, and traZODone.  No orders of the defined types were placed in this encounter.    Follow-up: No Follow-up on file.  Walker Kehr, MD

## 2016-06-30 NOTE — Assessment & Plan Note (Signed)
Aleve prn  Potential benefits of a long term NSAIDs use as well as potential risks  and complications were explained to the patient and were aknowledged.

## 2016-06-30 NOTE — Assessment & Plan Note (Signed)
Possibly bipolar depression Cymbalta Clonazepam prn  Potential benefits of a long term benzodiazepines  use as well as potential risks  and complications were explained to the patient and were aknowledged. 

## 2016-06-30 NOTE — Assessment & Plan Note (Signed)
Wt Readings from Last 3 Encounters:  06/30/16 267 lb (121.1 kg)  02/10/16 275 lb (124.7 kg)  12/31/15 270 lb (122.5 kg)

## 2016-06-30 NOTE — Patient Instructions (Signed)
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 Pt's mom has a 50% chance - not tested yet.  Will test Lorrena if mom is (+).

## 2016-06-30 NOTE — Assessment & Plan Note (Signed)
Chronic Relatives w/FMS Cymbalta  Potential benefits of a long term benzodiazepines  use as well as potential risks  and complications were explained to the patient and were aknowledged. Try Gabapentin

## 2016-06-30 NOTE — Assessment & Plan Note (Signed)
Sylvia Alvarado was dx'd with paraganglioma-pheochromacytoma syndrome (PGL-PCC) and had a genetic tests done - - 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 Pt's mom has a 50% chance - not tested yet.  Will test if mom is (+).

## 2016-06-30 NOTE — Progress Notes (Signed)
Pre visit review using our clinic review tool, if applicable. No additional management support is needed unless otherwise documented below in the visit note. 

## 2016-07-28 ENCOUNTER — Ambulatory Visit (INDEPENDENT_AMBULATORY_CARE_PROVIDER_SITE_OTHER): Payer: PRIVATE HEALTH INSURANCE

## 2016-07-28 ENCOUNTER — Other Ambulatory Visit (INDEPENDENT_AMBULATORY_CARE_PROVIDER_SITE_OTHER): Payer: PRIVATE HEALTH INSURANCE

## 2016-07-28 DIAGNOSIS — E669 Obesity, unspecified: Secondary | ICD-10-CM | POA: Diagnosis not present

## 2016-07-28 DIAGNOSIS — M797 Fibromyalgia: Secondary | ICD-10-CM | POA: Diagnosis not present

## 2016-07-28 DIAGNOSIS — R030 Elevated blood-pressure reading, without diagnosis of hypertension: Secondary | ICD-10-CM | POA: Diagnosis not present

## 2016-07-28 DIAGNOSIS — F4321 Adjustment disorder with depressed mood: Secondary | ICD-10-CM

## 2016-07-28 DIAGNOSIS — Z1379 Encounter for other screening for genetic and chromosomal anomalies: Secondary | ICD-10-CM

## 2016-07-28 DIAGNOSIS — Z315 Encounter for genetic counseling: Secondary | ICD-10-CM

## 2016-07-28 DIAGNOSIS — Z23 Encounter for immunization: Secondary | ICD-10-CM

## 2016-07-28 LAB — CBC WITH DIFFERENTIAL/PLATELET
BASOS PCT: 1.2 % (ref 0.0–3.0)
Basophils Absolute: 0.1 10*3/uL (ref 0.0–0.1)
EOS ABS: 0.1 10*3/uL (ref 0.0–0.7)
EOS PCT: 2.3 % (ref 0.0–5.0)
HEMATOCRIT: 33.4 % — AB (ref 36.0–46.0)
Hemoglobin: 11.1 g/dL — ABNORMAL LOW (ref 12.0–15.0)
LYMPHS PCT: 35 % (ref 12.0–46.0)
Lymphs Abs: 1.9 10*3/uL (ref 0.7–4.0)
MCHC: 33.3 g/dL (ref 30.0–36.0)
MCV: 79.1 fl (ref 78.0–100.0)
Monocytes Absolute: 0.5 10*3/uL (ref 0.1–1.0)
Monocytes Relative: 8.7 % (ref 3.0–12.0)
NEUTROS ABS: 2.9 10*3/uL (ref 1.4–7.7)
Neutrophils Relative %: 52.8 % (ref 43.0–77.0)
PLATELETS: 259 10*3/uL (ref 150.0–400.0)
RBC: 4.22 Mil/uL (ref 3.87–5.11)
RDW: 16 % — AB (ref 11.5–15.5)
WBC: 5.5 10*3/uL (ref 4.0–10.5)

## 2016-07-28 LAB — VITAMIN B12: Vitamin B-12: 600 pg/mL (ref 211–911)

## 2016-07-28 LAB — LIPID PANEL
CHOL/HDL RATIO: 3
CHOLESTEROL: 182 mg/dL (ref 0–200)
HDL: 56.6 mg/dL (ref >39.00)
LDL CALC: 105 mg/dL — AB (ref 0–99)
NonHDL: 125.63
Triglycerides: 103 mg/dL (ref 0.0–149.0)
VLDL: 20.6 mg/dL (ref 0.0–40.0)

## 2016-07-28 LAB — T4, FREE: FREE T4: 1.1 ng/dL (ref 0.60–1.60)

## 2016-07-28 LAB — HEPATIC FUNCTION PANEL
ALT: 13 U/L (ref 0–35)
AST: 17 U/L (ref 0–37)
Albumin: 3.8 g/dL (ref 3.5–5.2)
Alkaline Phosphatase: 54 U/L (ref 39–117)
Bilirubin, Direct: 0.1 mg/dL (ref 0.0–0.3)
TOTAL PROTEIN: 7.1 g/dL (ref 6.0–8.3)
Total Bilirubin: 0.4 mg/dL (ref 0.2–1.2)

## 2016-07-28 LAB — URINALYSIS
BILIRUBIN URINE: NEGATIVE
HGB URINE DIPSTICK: NEGATIVE
Leukocytes, UA: NEGATIVE
Nitrite: NEGATIVE
Specific Gravity, Urine: 1.02 (ref 1.000–1.030)
TOTAL PROTEIN, URINE-UPE24: NEGATIVE
URINE GLUCOSE: NEGATIVE
Urobilinogen, UA: 0.2 (ref 0.0–1.0)
pH: 7 (ref 5.0–8.0)

## 2016-07-28 LAB — BASIC METABOLIC PANEL
BUN: 16 mg/dL (ref 6–23)
CALCIUM: 8.8 mg/dL (ref 8.4–10.5)
CO2: 28 meq/L (ref 19–32)
CREATININE: 0.77 mg/dL (ref 0.40–1.20)
Chloride: 105 mEq/L (ref 96–112)
GFR: 86.97 mL/min (ref >60.00)
GLUCOSE: 83 mg/dL (ref 70–99)
POTASSIUM: 4.1 meq/L (ref 3.5–5.1)
Sodium: 140 mEq/L (ref 135–145)

## 2016-07-28 LAB — TSH: TSH: 0.49 u[IU]/mL (ref 0.35–4.50)

## 2016-07-28 LAB — VITAMIN D 25 HYDROXY (VIT D DEFICIENCY, FRACTURES): VITD: 26.02 ng/mL — ABNORMAL LOW (ref 30.00–100.00)

## 2016-07-28 LAB — HEMOGLOBIN A1C: Hgb A1c MFr Bld: 5.6 % (ref 4.6–6.5)

## 2016-07-30 MED ORDER — ERGOCALCIFEROL 1.25 MG (50000 UT) PO CAPS: 50000.0000 [IU] | ORAL_CAPSULE | ORAL | 0 refills | Status: AC

## 2016-08-09 ENCOUNTER — Encounter: Payer: Self-pay | Admitting: Internal Medicine

## 2016-08-10 ENCOUNTER — Encounter: Payer: Self-pay | Admitting: Internal Medicine

## 2016-08-17 ENCOUNTER — Other Ambulatory Visit: Payer: Self-pay | Admitting: Internal Medicine

## 2016-10-17 ENCOUNTER — Other Ambulatory Visit: Payer: Self-pay | Admitting: Internal Medicine

## 2016-10-23 NOTE — Telephone Encounter (Signed)
Clonazepam phone in. See meds.

## 2016-11-16 ENCOUNTER — Other Ambulatory Visit: Payer: Self-pay | Admitting: Internal Medicine

## 2016-12-29 ENCOUNTER — Other Ambulatory Visit: Payer: Self-pay | Admitting: Internal Medicine

## 2017-01-17 ENCOUNTER — Other Ambulatory Visit: Payer: Self-pay | Admitting: Internal Medicine

## 2017-01-19 MED ORDER — CLONAZEPAM 1 MG PO TABS: ORAL_TABLET | ORAL | 1 refills | Status: DC

## 2017-01-19 NOTE — Telephone Encounter (Signed)
Called refill into pharmacy spoke w/pharmacist gave MD approval.../lmb

## 2017-02-14 ENCOUNTER — Other Ambulatory Visit: Payer: Self-pay | Admitting: Internal Medicine

## 2017-02-15 ENCOUNTER — Telehealth: Payer: Self-pay | Admitting: Internal Medicine

## 2017-02-15 DIAGNOSIS — Z0000 Encounter for general adult medical examination without abnormal findings: Secondary | ICD-10-CM

## 2017-02-15 NOTE — Telephone Encounter (Signed)
Patient is having her cpe on April 25. She would like to do her labs on April 12th. Can these orders be put in?. Thank you.

## 2017-02-15 NOTE — Telephone Encounter (Signed)
OK CBC, TSH, BMET, LFTs, UA, Lipids Thx

## 2017-02-16 NOTE — Telephone Encounter (Signed)
Labs entered and my chart message sent to patient.

## 2017-02-22 ENCOUNTER — Other Ambulatory Visit (INDEPENDENT_AMBULATORY_CARE_PROVIDER_SITE_OTHER): Payer: PRIVATE HEALTH INSURANCE

## 2017-02-22 DIAGNOSIS — Z0000 Encounter for general adult medical examination without abnormal findings: Secondary | ICD-10-CM | POA: Diagnosis not present

## 2017-02-22 LAB — HEPATIC FUNCTION PANEL
ALBUMIN: 3.9 g/dL (ref 3.5–5.2)
ALT: 10 U/L (ref 0–35)
AST: 13 U/L (ref 0–37)
Alkaline Phosphatase: 54 U/L (ref 39–117)
BILIRUBIN TOTAL: 0.3 mg/dL (ref 0.2–1.2)
Bilirubin, Direct: 0.1 mg/dL (ref 0.0–0.3)
Total Protein: 7.1 g/dL (ref 6.0–8.3)

## 2017-02-22 LAB — BASIC METABOLIC PANEL
BUN: 13 mg/dL (ref 6–23)
CHLORIDE: 106 meq/L (ref 96–112)
CO2: 28 meq/L (ref 19–32)
CREATININE: 0.83 mg/dL (ref 0.40–1.20)
Calcium: 9.1 mg/dL (ref 8.4–10.5)
GFR: 79.54 mL/min (ref >60.00)
Glucose, Bld: 93 mg/dL (ref 70–99)
Potassium: 4 mEq/L (ref 3.5–5.1)
Sodium: 140 mEq/L (ref 135–145)

## 2017-02-22 LAB — CBC WITH DIFFERENTIAL/PLATELET
Basophils Absolute: 0.1 10*3/uL (ref 0.0–0.1)
Basophils Relative: 0.9 % (ref 0.0–3.0)
EOS PCT: 3.9 % (ref 0.0–5.0)
Eosinophils Absolute: 0.3 10*3/uL (ref 0.0–0.7)
HEMATOCRIT: 39.8 % (ref 36.0–46.0)
HEMOGLOBIN: 13 g/dL (ref 12.0–15.0)
LYMPHS PCT: 37.2 % (ref 12.0–46.0)
Lymphs Abs: 2.4 10*3/uL (ref 0.7–4.0)
MCHC: 32.7 g/dL (ref 30.0–36.0)
MCV: 81 fl (ref 78.0–100.0)
MONO ABS: 0.6 10*3/uL (ref 0.1–1.0)
Monocytes Relative: 9.2 % (ref 3.0–12.0)
Neutro Abs: 3.1 10*3/uL (ref 1.4–7.7)
Neutrophils Relative %: 48.8 % (ref 43.0–77.0)
Platelets: 226 10*3/uL (ref 150.0–400.0)
RBC: 4.91 Mil/uL (ref 3.87–5.11)
RDW: 17.4 % — ABNORMAL HIGH (ref 11.5–15.5)
WBC: 6.4 10*3/uL (ref 4.0–10.5)

## 2017-02-22 LAB — URINALYSIS, ROUTINE W REFLEX MICROSCOPIC
BILIRUBIN URINE: NEGATIVE
Hgb urine dipstick: NEGATIVE
KETONES UR: NEGATIVE
LEUKOCYTES UA: NEGATIVE
NITRITE: NEGATIVE
PH: 7.5 (ref 5.0–8.0)
Specific Gravity, Urine: 1.015 (ref 1.000–1.030)
TOTAL PROTEIN, URINE-UPE24: NEGATIVE
URINE GLUCOSE: NEGATIVE
Urobilinogen, UA: 0.2 (ref 0.0–1.0)

## 2017-02-22 LAB — LIPID PANEL
CHOL/HDL RATIO: 3
Cholesterol: 196 mg/dL (ref 0–200)
HDL: 58.1 mg/dL (ref >39.00)
LDL Cholesterol: 113 mg/dL — ABNORMAL HIGH (ref 0–99)
NONHDL: 138.23
Triglycerides: 124 mg/dL (ref 0.0–149.0)
VLDL: 24.8 mg/dL (ref 0.0–40.0)

## 2017-02-22 LAB — TSH: TSH: 0.65 u[IU]/mL (ref 0.35–4.50)

## 2017-03-07 ENCOUNTER — Encounter: Payer: Self-pay | Admitting: Internal Medicine

## 2017-03-07 ENCOUNTER — Ambulatory Visit (INDEPENDENT_AMBULATORY_CARE_PROVIDER_SITE_OTHER): Payer: 59 | Admitting: Internal Medicine

## 2017-03-07 VITALS — BP 118/82 | HR 98 | Temp 98.0°F | Ht 67.0 in | Wt 281.1 lb

## 2017-03-07 DIAGNOSIS — Z9989 Dependence on other enabling machines and devices: Secondary | ICD-10-CM | POA: Diagnosis not present

## 2017-03-07 DIAGNOSIS — J0100 Acute maxillary sinusitis, unspecified: Secondary | ICD-10-CM

## 2017-03-07 DIAGNOSIS — Z0000 Encounter for general adult medical examination without abnormal findings: Secondary | ICD-10-CM | POA: Diagnosis not present

## 2017-03-07 DIAGNOSIS — F4321 Adjustment disorder with depressed mood: Secondary | ICD-10-CM | POA: Diagnosis not present

## 2017-03-07 DIAGNOSIS — J4520 Mild intermittent asthma, uncomplicated: Secondary | ICD-10-CM

## 2017-03-07 DIAGNOSIS — M722 Plantar fascial fibromatosis: Secondary | ICD-10-CM | POA: Diagnosis not present

## 2017-03-07 DIAGNOSIS — J019 Acute sinusitis, unspecified: Secondary | ICD-10-CM | POA: Insufficient documentation

## 2017-03-07 DIAGNOSIS — I10 Essential (primary) hypertension: Secondary | ICD-10-CM

## 2017-03-07 DIAGNOSIS — G4733 Obstructive sleep apnea (adult) (pediatric): Secondary | ICD-10-CM

## 2017-03-07 MED ORDER — DULOXETINE HCL 60 MG PO CPEP: 60.0000 mg | ORAL_CAPSULE | Freq: Every day | ORAL | 1 refills | Status: DC

## 2017-03-07 MED ORDER — ALBUTEROL SULFATE HFA 108 (90 BASE) MCG/ACT IN AERS: 2.0000 | INHALATION_SPRAY | Freq: Four times a day (QID) | RESPIRATORY_TRACT | 3 refills | Status: DC | PRN

## 2017-03-07 MED ORDER — AZITHROMYCIN 250 MG PO TABS: ORAL_TABLET | ORAL | 0 refills | Status: DC

## 2017-03-07 MED ORDER — CYCLOBENZAPRINE HCL 10 MG PO TABS: ORAL_TABLET | ORAL | 2 refills | Status: DC

## 2017-03-07 MED ORDER — DICLOFENAC SODIUM 1 % TD GEL: TRANSDERMAL | 2 refills | Status: DC

## 2017-03-07 MED ORDER — NAPROXEN 500 MG PO TABS
500.0000 mg | ORAL_TABLET | Freq: Two times a day (BID) | ORAL | 5 refills | Status: DC | PRN
Start: 2017-03-07 — End: 2018-03-18

## 2017-03-07 MED ORDER — METFORMIN HCL 500 MG PO TABS: 500.0000 mg | ORAL_TABLET | Freq: Every day | ORAL | 3 refills | Status: DC

## 2017-03-07 MED ORDER — METHYLPREDNISOLONE ACETATE 40 MG/ML IJ SUSP
40.0000 mg | Freq: Once | INTRAMUSCULAR | Status: AC
  Administered 2017-03-07: 40 mg via INTRA_ARTICULAR

## 2017-03-07 NOTE — Assessment & Plan Note (Signed)
We discussed age appropriate health related issues, including available/recomended screening tests and vaccinations. We discussed a need for adhering to healthy diet and exercise. Labs were reviewed . All questions were answered. 

## 2017-03-07 NOTE — Assessment & Plan Note (Signed)
Zpac 

## 2017-03-07 NOTE — Patient Instructions (Addendum)
Postprocedure instructions :    A Band-Aid should be left on for 12 hours. Injection therapy is not a cure itself. It is used in conjunction with other modalities. You can use nonsteroidal anti-inflammatories like ibuprofen , hot and cold compresses. Rest is recommended in the next 24 hours. You need to report immediately  if fever, chills or any signs of infection develop.    [employer] supports

## 2017-03-07 NOTE — Assessment & Plan Note (Signed)
Re-start CPAP 

## 2017-03-07 NOTE — Progress Notes (Signed)
Pre visit review using our clinic review tool, if applicable. No additional management support is needed unless otherwise documented below in the visit note. 

## 2017-03-07 NOTE — Progress Notes (Signed)
Subjective:  Patient ID: Sylvia Alvarado, female    DOB: [birth year]  Age: 46 y.o. MRN: [id number]  CC: No chief complaint on file.   HPI Sylvia Alvarado presents for a well exam C/o R heel pain x weeks C/o URI sx's  Outpatient Medications Prior to Visit  Medication Sig Dispense Refill  . albuterol (PROVENTIL HFA;VENTOLIN HFA) 108 (90 BASE) MCG/ACT inhaler Inhale 2 puffs into the lungs every 6 (six) hours as needed for wheezing or shortness of breath. 18 g 3  . Cholecalciferol (VITAMIN D3) 1000 UNITS CAPS Take 1,000 Units by mouth daily.     . clonazePAM (KLONOPIN) 1 MG tablet TAKE (1) TABLET BY MOUTH THREE TIMES DAILY AS NEEDED. 90 tablet 1  . Cyanocobalamin (VITAMIN B-12) 500 MCG SUBL 1 sl qd (Patient taking differently: Place 500 mcg under the tongue daily. ) 100 tablet 3  . cyclobenzaprine (FLEXERIL) 10 MG tablet TAKE (1) TABLET BY MOUTH TWICE DAILY. 60 tablet 0  . diclofenac sodium (VOLTAREN) 1 % GEL APPLY TO AFFECTED AREAS TWICE DAILY AS NEEDED. (Patient taking differently: APPLY TO AFFECTED AREAS TWICE DAILY AS NEEDED PAIN) 100 g 1  . DULoxetine (CYMBALTA) 60 MG capsule TAKE (1) CAPSULE BY MOUTH ONCE DAILY. 60 capsule 3  . ergocalciferol (VITAMIN D2) 50000 units capsule Take 1 capsule (50,000 Units total) by mouth once a week. 6 capsule 0  . gabapentin (NEURONTIN) 100 MG capsule TAKE 1 TO 3 CAPSULES BY MOUTH AT BEDTIME. 90 capsule 5  . metFORMIN (GLUCOPHAGE) 500 MG tablet Take 1 tablet (500 mg total) by mouth daily with breakfast. 90 tablet 3  . metroNIDAZOLE (METROGEL) 0.75 % gel Apply 1 application topically at bedtime. 45 g 3  . naproxen (NAPROSYN) 500 MG tablet Take 1 tablet (500 mg total) by mouth 2 (two) times daily as needed for moderate pain. 60 tablet 5  . traZODone (DESYREL) 50 MG tablet TAKE (1) TABLET BY MOUTH AT BEDTIME. 30 tablet 5  . Turmeric 450 MG CAPS Take 450 mg by mouth daily.     No facility-administered medications prior to visit.     ROS Review of Systems    Constitutional: Negative for activity change, appetite change, chills, fatigue and unexpected weight change.  HENT: Positive for congestion, rhinorrhea and sinus pressure. Negative for mouth sores.   Eyes: Negative for visual disturbance.  Respiratory: Negative for cough and chest tightness.   Gastrointestinal: Negative for abdominal pain and nausea.  Genitourinary: Negative for difficulty urinating, frequency and vaginal pain.  Musculoskeletal: Positive for arthralgias and gait problem. Negative for back pain.  Skin: Negative for pallor and rash.  Neurological: Negative for dizziness, tremors, weakness, numbness and headaches.  Psychiatric/Behavioral: Negative for confusion and sleep disturbance.    Objective:  BP 118/82 (BP Location: Left Arm, Patient Position: Sitting, Cuff Size: Large)   Pulse 98   Temp 98 F (36.7 C) (Oral)   Ht 5\' 7"  (1.702 m)   Wt 281 lb 1.3 oz (127.5 kg)   SpO2 98%   BMI 44.02 kg/m   BP Readings from Last 3 Encounters:  03/07/17 118/82  06/30/16 118/70  02/10/16 (!) 140/51    Wt Readings from Last 3 Encounters:  03/07/17 281 lb 1.3 oz (127.5 kg)  06/30/16 267 lb (121.1 kg)  02/10/16 275 lb (124.7 kg)    Physical Exam  Constitutional: She appears well-developed. No distress.  HENT:  Head: Normocephalic.  Right Ear: External ear normal.  Left Ear: External ear normal.  Nose: Nose normal.  Mouth/Throat: Oropharynx is clear and moist.  Eyes: Conjunctivae are normal. Pupils are equal, round, and reactive to light. Right eye exhibits no discharge. Left eye exhibits no discharge.  Neck: Normal range of motion. Neck supple. No JVD present. No tracheal deviation present. No thyromegaly present.  Cardiovascular: Normal rate, regular rhythm and normal heart sounds.   Pulmonary/Chest: No stridor. No respiratory distress. She has no wheezes.  Abdominal: Soft. Bowel sounds are normal. She exhibits no distension and no mass. There is no tenderness. There is  no rebound and no guarding.  Musculoskeletal: She exhibits no edema or tenderness.  Lymphadenopathy:    She has no cervical adenopathy.  Neurological: She displays normal reflexes. No cranial nerve deficit. She exhibits normal muscle tone. Coordination normal.  Skin: No rash noted. No erythema.  Psychiatric: She has a normal mood and affect. Her behavior is normal. Judgment and thought content normal.  Obese R medial heel - tender    Procedure Note :     Procedure : R heel injection   Indication: R  plantar fasciitis with refractory  chronic pain.   Risks including unsuccessful procedure , bleeding, infection, bruising, skin atrophy, "steroid flare-up" and others were explained to the patient in detail as well as the benefits. Informed consent was obtained.   Tthe patient was placed in a comfortable  decubitus position. The point of maximal tenderness was identified medially. Skin was prepped with Betadine and alcohol. Then, a 5 cc syringe with a 1.5 inch long 25-gauge needle was used for a heel injection.. The needle was advanced and I injected the heel with 2 mL of 2% lidocaine and 40 mg of Depo-Medrol .  Band-Aid was applied.   Tolerated well. Complications: None. Good pain relief following the procedure.      Lab Results  Component Value Date   WBC 6.4 02/22/2017   HGB 13.0 02/22/2017   HCT 39.8 02/22/2017   PLT 226.0 02/22/2017   GLUCOSE 93 02/22/2017   CHOL 196 02/22/2017   TRIG 124.0 02/22/2017   HDL 58.10 02/22/2017   LDLDIRECT 145.5 09/12/2007   LDLCALC 113 (H) 02/22/2017   ALT 10 02/22/2017   AST 13 02/22/2017   NA 140 02/22/2017   K 4.0 02/22/2017   CL 106 02/22/2017   CREATININE 0.83 02/22/2017   BUN 13 02/22/2017   CO2 28 02/22/2017   TSH 0.65 02/22/2017   HGBA1C 5.6 07/28/2016    Dg Chest 2 View  Result Date: 02/10/2016 CLINICAL DATA:  Cough, chest tightness, wheezing, shortness of breath, headache and sore throat for 3 days, worsening. EXAM: CHEST  2  VIEW COMPARISON:  CT chest 12/08/2014.  PA and lateral chest 12/07/2014. FINDINGS: The lungs are clear. Heart size is normal. There is no pneumothorax or pleural effusion. No focal bony abnormality. IMPRESSION: Negative chest. Electronically Signed   By: Inge Rise M.D.   On: 02/10/2016 13:50    Assessment & Plan:   There are no diagnoses linked to this encounter. I am having Ms. Allen maintain her Vitamin D3, Turmeric, metroNIDAZOLE, albuterol, naproxen, Vitamin B-12, diclofenac sodium, metFORMIN, ergocalciferol, DULoxetine, cyclobenzaprine, gabapentin, traZODone, and clonazePAM.  No orders of the defined types were placed in this encounter.    Follow-up: No Follow-up on file.  Walker Kehr, MD

## 2017-03-07 NOTE — Assessment & Plan Note (Signed)
Controlled - NAS diet

## 2017-03-07 NOTE — Assessment & Plan Note (Addendum)
Sports med ref offered [employer] supports See procedure

## 2017-03-16 ENCOUNTER — Telehealth: Payer: Self-pay | Admitting: Internal Medicine

## 2017-03-17 ENCOUNTER — Encounter: Payer: Self-pay | Admitting: Internal Medicine

## 2017-03-21 NOTE — Telephone Encounter (Signed)
Did not receive med. Please resend.

## 2017-03-22 NOTE — Telephone Encounter (Signed)
Spoke to the pharmacist yesterday on the phone and got medication filled.

## 2017-04-23 ENCOUNTER — Other Ambulatory Visit: Payer: Self-pay | Admitting: Internal Medicine

## 2017-04-23 NOTE — Telephone Encounter (Signed)
Patient has called in regard to this.  Would like to try to get this afternoon.  Would like to know if she can get refills for this as well.

## 2017-04-24 ENCOUNTER — Encounter: Payer: Self-pay | Admitting: Internal Medicine

## 2017-04-24 NOTE — Telephone Encounter (Signed)
Pt called in about this refill?   °

## 2017-04-25 MED ORDER — CLONAZEPAM 1 MG PO TABS: 1.0000 mg | ORAL_TABLET | Freq: Three times a day (TID) | ORAL | 2 refills | Status: DC | PRN

## 2017-05-09 ENCOUNTER — Encounter (HOSPITAL_COMMUNITY): Payer: Self-pay | Admitting: *Deleted

## 2017-05-09 ENCOUNTER — Emergency Department (HOSPITAL_COMMUNITY)
Admission: EM | Admit: 2017-05-09 | Discharge: 2017-05-10 | Disposition: A | Discharge status: Home / Self Care | Payer: 59 | Attending: Emergency Medicine | Admitting: Emergency Medicine

## 2017-05-09 DIAGNOSIS — Z87891 Personal history of nicotine dependence: Secondary | ICD-10-CM | POA: Insufficient documentation

## 2017-05-09 DIAGNOSIS — S70362A Insect bite (nonvenomous), left thigh, initial encounter: Secondary | ICD-10-CM | POA: Insufficient documentation

## 2017-05-09 DIAGNOSIS — Y929 Unspecified place or not applicable: Secondary | ICD-10-CM | POA: Diagnosis not present

## 2017-05-09 DIAGNOSIS — Z79899 Other long term (current) drug therapy: Secondary | ICD-10-CM | POA: Insufficient documentation

## 2017-05-09 DIAGNOSIS — W57XXXA Bitten or stung by nonvenomous insect and other nonvenomous arthropods, initial encounter: Secondary | ICD-10-CM | POA: Insufficient documentation

## 2017-05-09 DIAGNOSIS — Y939 Activity, unspecified: Secondary | ICD-10-CM | POA: Diagnosis not present

## 2017-05-09 DIAGNOSIS — I10 Essential (primary) hypertension: Secondary | ICD-10-CM | POA: Insufficient documentation

## 2017-05-09 DIAGNOSIS — J45909 Unspecified asthma, uncomplicated: Secondary | ICD-10-CM | POA: Diagnosis not present

## 2017-05-09 DIAGNOSIS — Y999 Unspecified external cause status: Secondary | ICD-10-CM | POA: Insufficient documentation

## 2017-05-09 NOTE — ED Triage Notes (Signed)
Pt c/o insect bites to left hip area tonight and after being bitten pt started c/o headache and nausea,

## 2017-05-10 MED ORDER — LORATADINE 10 MG PO TABS
10.0000 mg | ORAL_TABLET | Freq: Once | ORAL | Status: AC
  Administered 2017-05-10: 10 mg via ORAL
  Filled 2017-05-10: qty 1

## 2017-05-10 MED ORDER — TRIAMCINOLONE ACETONIDE 0.1 % EX CREA: 1.0000 "application " | TOPICAL_CREAM | Freq: Two times a day (BID) | CUTANEOUS | 0 refills | Status: DC

## 2017-05-10 MED ORDER — DEXAMETHASONE 4 MG PO TABS: 4.0000 mg | ORAL_TABLET | Freq: Two times a day (BID) | ORAL | 0 refills | Status: DC

## 2017-05-10 MED ORDER — DEXAMETHASONE SODIUM PHOSPHATE 4 MG/ML IJ SOLN
8.0000 mg | Freq: Once | INTRAMUSCULAR | Status: AC
  Administered 2017-05-10: 8 mg via INTRAMUSCULAR
  Filled 2017-05-10: qty 2

## 2017-05-10 NOTE — ED Provider Notes (Signed)
[facility] DEPT Provider Note   CSN: [id number] Arrival date & time: 05/09/17  2109     History   Chief Complaint Chief Complaint  Patient presents with  . Insect Bite    HPI Sylvia Alvarado is a 46 y.o. female.  Patient is a 46 year old female who presents to the emergency department following insect bites to the left lower extremity.  The patient states she was at her fianc's home. He has a a lot of weeds and plans and his yard. She proceeded to get in her vehicle, and while in her vehicle she felt multiple items or stings to the back of her leg. When she got to her destination she evaluated and noted one large red area accompanied by multiple small red raised bumps. These were itching her intensely. The patient denies any unusual shortness of breath or difficulty swallowing. She has not noted hives. She has not had this response to an insect bite in the past. She states she did not see a spider, but she was concerned she may have been bitten by a brown recluse spider. No other symptoms reported. Patient is not taken any medication for this problem up to this point.      Past Medical History:  Diagnosis Date  . Allergy   . Asthma   . Depression   . Hypertension   . Low back pain   . Obesity   . OSA (obstructive sleep apnea)    on CPAP Dr Gwenette Greet    Patient Active Problem List   Diagnosis Date Noted  . Plantar fasciitis, right 03/07/2017  . Acute sinusitis 03/07/2017  . Genetic testing 06/30/2016  . Chest pain 12/08/2014  . Carpal tunnel syndrome 08/24/2014  . Rash and nonspecific skin eruption 06/15/2014  . Unspecified symptom associated with female genital organs 05/23/2013  . PMS (premenstrual syndrome) 05/23/2013  . Stress at work 03/21/2013  . OSA on CPAP 11/15/2012  . Well adult exam 11/15/2012  . Excessive anger 09/06/2011  . Fibromyalgia 09/06/2011  . SOMNOLENCE 08/01/2010  . FATIGUE 08/01/2010  . SNORING 08/01/2010  . OBSTRUCTIVE SLEEP APNEA  04/14/2010  . PARESTHESIA 04/14/2010  . EDEMA 04/14/2010  . RASH AND OTHER NONSPECIFIC SKIN ERUPTION 01/11/2009  . ELEVATED BP 09/30/2008  . HEMATURIA UNSPECIFIED 07/30/2008  . FLANK PAIN, RIGHT 07/30/2008  . RESTLESS LEG SYNDROME 04/16/2008  . ALLERGIC RHINITIS 04/16/2008  . REDUCTION MAMMOPLASTY, HX OF 04/16/2008  . Obesity 09/12/2007  . Situational depression 09/12/2007  . Essential hypertension 09/12/2007  . ASTHMA 09/12/2007  . ROSACEA 09/12/2007  . LOW BACK PAIN 09/12/2007    Past Surgical History:  Procedure Laterality Date  . ENDOMETRIAL ABLATION    . TUBAL LIGATION      OB History    No data available       Home Medications    Prior to Admission medications   Medication Sig Start Date End Date Taking? Authorizing Provider  albuterol (PROVENTIL HFA;VENTOLIN HFA) 108 (90 Base) MCG/ACT inhaler Inhale 2 puffs into the lungs every 6 (six) hours as needed for wheezing or shortness of breath. 03/07/17   Plotnikov, Evie Lacks, MD  azithromycin (ZITHROMAX) 250 MG tablet As directed 03/07/17   Plotnikov, Evie Lacks, MD  Cholecalciferol (VITAMIN D3) 1000 UNITS CAPS Take 1,000 Units by mouth daily.     [provider]  clonazePAM (KLONOPIN) 1 MG tablet Take 1 tablet (1 mg total) by mouth 3 (three) times daily as needed for anxiety. 04/25/17   Plotnikov, Tyrone Apple  V, MD  Cyanocobalamin (VITAMIN B-12) 500 MCG SUBL 1 sl qd Patient taking differently: Place 500 mcg under the tongue daily.  12/31/15   Plotnikov, Evie Lacks, MD  cyclobenzaprine (FLEXERIL) 10 MG tablet TAKE (1) TABLET BY MOUTH TWICE DAILY PRN 03/07/17   Plotnikov, Evie Lacks, MD  diclofenac sodium (VOLTAREN) 1 % GEL APPLY TO AFFECTED AREAS TWICE DAILY AS NEEDED. 03/07/17   Plotnikov, Evie Lacks, MD  DULoxetine (CYMBALTA) 60 MG capsule Take 1 capsule (60 mg total) by mouth daily. 03/07/17   Plotnikov, Evie Lacks, MD  ergocalciferol (VITAMIN D2) 50000 units capsule Take 1 capsule (50,000 Units total) by mouth once a week.  07/30/16 07/30/17  Plotnikov, Evie Lacks, MD  gabapentin (NEURONTIN) 100 MG capsule TAKE 1 TO 3 CAPSULES BY MOUTH AT BEDTIME. 01/17/17   Plotnikov, Evie Lacks, MD  metFORMIN (GLUCOPHAGE) 500 MG tablet Take 1 tablet (500 mg total) by mouth daily with breakfast. 03/07/17   Plotnikov, Evie Lacks, MD  metroNIDAZOLE (METROGEL) 0.75 % gel Apply 1 application topically at bedtime. 03/09/15   Plotnikov, Evie Lacks, MD  naproxen (NAPROSYN) 500 MG tablet Take 1 tablet (500 mg total) by mouth 2 (two) times daily as needed for moderate pain. 03/07/17   Plotnikov, Evie Lacks, MD  traZODone (DESYREL) 50 MG tablet TAKE (1) TABLET BY MOUTH AT BEDTIME. 01/17/17   Plotnikov, Evie Lacks, MD  Turmeric 450 MG CAPS Take 450 mg by mouth daily.    [provider]    Family History Family History  Problem Relation Age of Onset  . Cancer Mother        Uterine  . Emphysema Father   . Cancer Maternal Grandfather        Brain  . Arthritis Other   . Heart disease Other        A Fib  . Cancer Maternal Grandmother 54       GM was dx'd with paraganglioma-pheochromacytoma syndrome (PGL-PCC) and had a genetic tests done - - 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 Pt's mom has a 50% chance - not tested yet.     Social History Social History  Substance Use Topics  . Smoking status: Former Research scientist (life sciences)  . Smokeless tobacco: Never Used  . Alcohol use No     Allergies   Ivp dye [iodinated diagnostic agents]; Butrans [buprenorphine]; and Ropinirole hydrochloride   Review of Systems Review of Systems  Constitutional: Negative for activity change, chills, diaphoresis, fatigue and fever.       All ROS Neg except as noted in HPI  HENT: Negative for nosebleeds.   Eyes: Negative for photophobia and discharge.  Respiratory: Negative for cough, choking, chest tightness, shortness of breath, wheezing and stridor.   Cardiovascular: Negative for chest pain and palpitations.  Gastrointestinal: Negative for abdominal pain and blood in  stool.  Genitourinary: Negative for dysuria, frequency and hematuria.  Musculoskeletal: Negative for arthralgias, back pain and neck pain.  Skin: Positive for rash.  Neurological: Negative for dizziness, seizures and speech difficulty.  Psychiatric/Behavioral: Negative for confusion and hallucinations.     Physical Exam Updated Vital Signs BP 116/76 (BP Location: Right Arm)   Pulse 76   Temp 98.1 F (36.7 C) (Oral)   Resp 18   Ht 5\' 7"  (1.702 m)   Wt 124.7 kg (275 lb)   SpO2 97%   BMI 43.07 kg/m   Physical Exam  Constitutional: Vital signs are normal. She appears well-developed and well-nourished. She is active.  HENT:  Head: Normocephalic and  atraumatic.  Right Ear: Tympanic membrane, external ear and ear canal normal.  Left Ear: Tympanic membrane, external ear and ear canal normal.  Nose: Nose normal.  Mouth/Throat: Uvula is midline, oropharynx is clear and moist and mucous membranes are normal.  Eyes: Conjunctivae, EOM and lids are normal. Pupils are equal, round, and reactive to light.  Neck: Trachea normal, normal range of motion and phonation normal. Neck supple. Carotid bruit is not present.  Cardiovascular: Normal rate, regular rhythm and normal pulses.   Pulmonary/Chest:  There is no wheezing appreciated. The patient speaks in complete sentences without problem. There is symmetrical rise and fall of the chest.  Abdominal: Soft. Normal appearance and bowel sounds are normal.  Musculoskeletal:  There are two quarters size red areas of the posterior portion of the left thigh. There are multiple red raised areas of the posterior portion of the thigh. There no red streaks appreciated. There is no drainage noted.  Lymphadenopathy:       Head (right side): No submental, no preauricular and no posterior auricular adenopathy present.       Head (left side): No submental, no preauricular and no posterior auricular adenopathy present.    She has no cervical adenopathy.    Neurological: She is alert. She has normal strength. No cranial nerve deficit or sensory deficit. GCS eye subscore is 4. GCS verbal subscore is 5. GCS motor subscore is 6.  Skin: Skin is warm and dry.  No hives appreciated.  Psychiatric: Her speech is normal.     ED Treatments / Results  Labs (all labs ordered are listed, but only abnormal results are displayed) Labs Reviewed - No data to display  EKG  EKG Interpretation None       Radiology No results found.  Procedures Procedures (including critical care time)  Medications Ordered in ED Medications  dexamethasone (DECADRON) injection 8 mg (not administered)  loratadine (CLARITIN) tablet 10 mg (not administered)     Initial Impression / Assessment and Plan / ED Course  I have reviewed the triage vital signs and the nursing notes.  Pertinent labs & imaging results that were available during my care of the patient were reviewed by me and considered in my medical decision making (see chart for details).       Final Clinical Impressions(s) / ED Diagnoses MDM Vital signs within normal limits. Pulse oximetry is 97% on room air. Within normal limits by my interpretation. The examination is consistent with a multiple insect bites. I attempted to comfort the patient and knowing that the pattern of the bites and the exam findings this many hours after the initial bites were not consistent with the more dangerous spiders in this area. The patient will be treated with Allegra, Benadryl, triamcinolone, and Decadron. Patient is in agreement with this plan.    Final diagnoses:  Insect bite, initial encounter    New Prescriptions New Prescriptions   DEXAMETHASONE (DECADRON) 4 MG TABLET    Take 1 tablet (4 mg total) by mouth 2 (two) times daily with a meal.   TRIAMCINOLONE CREAM (KENALOG) 0.1 %    Apply 1 application topically 2 (two) times daily.     Lily Kocher, PA-C 05/10/17 0041    Ezequiel Essex, MD 05/10/17  [phone]

## 2017-05-10 NOTE — Discharge Instructions (Signed)
Please use Allegra each morning. May use Benadryl at bedtime, or every 6 hours as needed for more severe itching on. Please use Decadron 2 times daily with food. Apply triamcinolone into times daily to the rash area. Please see your primary physician for additional evaluation or return to the emergency department if not improving.

## 2017-06-15 ENCOUNTER — Other Ambulatory Visit: Payer: Self-pay | Admitting: Internal Medicine

## 2017-07-16 ENCOUNTER — Other Ambulatory Visit: Payer: Self-pay | Admitting: Internal Medicine

## 2017-07-17 ENCOUNTER — Other Ambulatory Visit: Payer: Self-pay | Admitting: General Practice

## 2017-07-17 MED ORDER — TRAZODONE HCL 50 MG PO TABS: ORAL_TABLET | ORAL | 5 refills | Status: DC

## 2017-08-14 ENCOUNTER — Ambulatory Visit (INDEPENDENT_AMBULATORY_CARE_PROVIDER_SITE_OTHER): Payer: 59 | Admitting: Adult Health

## 2017-08-14 ENCOUNTER — Other Ambulatory Visit (HOSPITAL_COMMUNITY)
Admission: RE | Admit: 2017-08-14 | Discharge: 2017-08-14 | Disposition: A | Discharge status: Home / Self Care | Payer: 59 | Source: Ambulatory Visit | Attending: Adult Health | Admitting: Adult Health

## 2017-08-14 ENCOUNTER — Encounter: Payer: Self-pay | Admitting: Adult Health

## 2017-08-14 ENCOUNTER — Encounter: Payer: Self-pay | Admitting: Internal Medicine

## 2017-08-14 VITALS — BP 130/90 | HR 88 | Ht 67.0 in | Wt 294.0 lb

## 2017-08-14 DIAGNOSIS — R110 Nausea: Secondary | ICD-10-CM

## 2017-08-14 DIAGNOSIS — Z01419 Encounter for gynecological examination (general) (routine) without abnormal findings: Secondary | ICD-10-CM | POA: Insufficient documentation

## 2017-08-14 DIAGNOSIS — Z1211 Encounter for screening for malignant neoplasm of colon: Secondary | ICD-10-CM | POA: Diagnosis present

## 2017-08-14 DIAGNOSIS — R232 Flushing: Secondary | ICD-10-CM | POA: Diagnosis not present

## 2017-08-14 DIAGNOSIS — Z1212 Encounter for screening for malignant neoplasm of rectum: Secondary | ICD-10-CM

## 2017-08-14 DIAGNOSIS — K219 Gastro-esophageal reflux disease without esophagitis: Secondary | ICD-10-CM | POA: Diagnosis not present

## 2017-08-14 DIAGNOSIS — N816 Rectocele: Secondary | ICD-10-CM

## 2017-08-14 LAB — HEMOCCULT GUIAC POC 1CARD (OFFICE): FECAL OCCULT BLD: NEGATIVE

## 2017-08-14 MED ORDER — DEXLANSOPRAZOLE 60 MG PO CPDR: 60.0000 mg | DELAYED_RELEASE_CAPSULE | Freq: Every day | ORAL | 0 refills | Status: DC

## 2017-08-14 NOTE — Progress Notes (Signed)
Subjective:     Patient ID: Sylvia Alvarado, female   DOB: [birth year], 46 y.o.   MRN: [id number]  HPI Sylvia Alvarado is a 46 year old single white female in for pap smear, and breast exam(her request), had physical with PCP.She is sp ablation.She is a Building control surveyor at [employer]. PCP is Dr Alain Marion.  Review of Systems +hot flashes  Moody +nausea and reflux  +bulge in vagina  Reviewed past medical,surgical, social and family history. Reviewed medications and allergies.     Objective:   Physical Exam BP 130/90 (BP Location: Left Arm, Patient Position: Sitting, Cuff Size: Large)   Pulse 88   Ht 5\' 7"  (1.702 m)   Wt 294 lb (133.4 kg)   BMI 46.05 kg/m    Skin warm and dry,  Breasts:no dominate palpable mass, retraction or nipple discharge, spp breast reduction, well healed scars Pelvic: external genitalia is normal in appearance no lesions, vagina: pink with good moisture and rugae,urethra has no lesions or masses noted, cervix:smooth and bulbous, pap with HPV performed, uterus: normal size, shape and contour, non tender, no masses felt, adnexa: no masses or tenderness noted. Bladder is non tender and no masses felt.    On rectal exam has good tone, no polyps,hemoccult negative, + rectocele. Discussed maybe trying HRT, she will let me know, if wants to try.Also explained rectocele.    Assessment:     1. Encounter for gynecological examination with Papanicolaou smear of cervix   2. Rectocele   3. Nausea   4. Gastroesophageal reflux disease without esophagitis   5. Hot flashes   6. Screening for colorectal cancer       Plan:     Rx dexilant 60 mg #20 take 1 daily lot #[id number] 2/21 Pelvic exam in 1 year, pap in 3 if normal Review handouts on HRT and menopause, and rectocele  Call me if wants to try HRT

## 2017-08-14 NOTE — Addendum Note (Signed)
Addended by: Diona Fanti A on: 08/14/2017 05:04 PM   Modules accepted: Orders

## 2017-08-14 NOTE — Patient Instructions (Signed)
Menopause and Hormone Replacement Therapy What is hormone replacement therapy? Hormone replacement therapy (HRT) is the use of artificial (synthetic) hormones to replace hormones that your body stops producing during menopause. Menopause is the normal time of life when menstrual periods stop completely and the ovaries stop producing the female hormones estrogen and progesterone. This lack of hormones can affect your health and cause undesirable symptoms. HRT can relieve some of those symptoms. What are my options for HRT? HRT may consist of the synthetic hormones estrogen and progestin, or it may consist of only estrogen (estrogen-only therapy). You and your health care provider will decide which form of HRT is best for you. If you choose to be on HRT and you have a uterus, estrogen and progestin are usually prescribed. Estrogen-only therapy is used for women who do not have a uterus. Possible options for taking HRT include:  Pills.  Patches.  Gels.  Sprays.  Vaginal cream.  Vaginal rings.  Vaginal inserts.  The amount of hormone(s) that you take and how long you take the hormone(s) varies depending on your individual health. It is important to:  Begin HRT with the lowest possible dosage.  Stop HRT as soon as your health care provider tells you to stop.  Work with your health care provider so that you feel informed and comfortable with your decisions.  What are the benefits of HRT? HRT can reduce the frequency and severity of menopausal symptoms. Benefits of HRT vary depending on the menopausal symptoms that you have, the severity of your symptoms, and your overall health. HRT may help to improve the following menopausal symptoms:  Hot flashes and night sweats. These are sudden feelings of heat that spread over the face and body. The skin may turn red, like a blush. Night sweats are hot flashes that happen while you are sleeping or trying to sleep.  Bone loss (osteoporosis). The  body loses calcium more quickly after menopause, causing the bones to become weaker. This can increase the risk for bone breaks (fractures).  Vaginal dryness. The lining of the vagina can become thin and dry, which can cause pain during sexual intercourse or cause infection, burning, or itching.  Urinary tract infections.  Urinary incontinence. This is a decreased ability to control when you urinate.  Irritability.  Short-term memory problems.  What are the risks of HRT? Risks of HRT vary depending on your individual health and medical history. Risks of HRT also depend on whether you receive both estrogen and progestin or you receive estrogen only.HRT may increase the risk of:  Spotting. This is when a small amount of bloodleaks from the vagina unexpectedly.  Endometrial cancer. This cancer is in the lining of the uterus (endometrium).  Breast cancer.  Increased density of breast tissue. This can make it harder to find breast cancer on a breast X-ray (mammogram).  Stroke.  Heart attack.  Blood clots.  Gallbladder disease.  Risks of HRT can increase if you have any of the following conditions:  Endometrial cancer.  Liver disease.  Heart disease.  Breast cancer.  History of blood clots.  History of stroke.  How should I care for myself while I am on HRT?  Take over-the-counter and prescription medicines only as told by your health care provider.  Get mammograms, pelvic exams, and medical checkups as often as told by your health care provider.  Have Pap tests done as often as told by your health care provider. A Pap test is sometimes called a Pap   smear. It is a screening test that is used to check for signs of cancer of the cervix and vagina. A Pap test can also identify the presence of infection or precancerous changes. Pap tests may be done: ? Every 3 years, starting at age 21. ? Every 5 years, starting after age 30, in combination with testing for human  papillomavirus (HPV). ? More often or less often depending on other medical conditions you have, your age, and other risk factors.  It is your responsibility to get your Pap test results. Ask your health care provider or the department performing the test when your results will be ready.  Keep all follow-up visits as told by your health care provider. This is important. When should I seek medical care? Talk with your health care provider if:  You have any of these: ? Pain or swelling in your legs. ? Shortness of breath. ? Chest pain. ? Lumps or changes in your breasts or armpits. ? Slurred speech. ? Pain, burning, or bleeding when you urine.  You develop any of these: ? Unusual vaginal bleeding. ? Dizziness or headaches. ? Weakness or numbness in any part of your arms or legs. ? Pain in your abdomen.  This information is not intended to replace advice given to you by your health care provider. Make sure you discuss any questions you have with your health care provider. Document Released: 07/29/2003 Document Revised: 09/26/2016 Document Reviewed: 05/03/2015 Elsevier Interactive Patient Education  2017 [employer] Menopause Menopause is the normal time of life when menstrual periods stop completely. Menopause is complete when you have missed 12 consecutive menstrual periods. It usually occurs between the ages of 48 years and 55 years. Very rarely does a woman develop menopause before the age of 40 years. At menopause, your ovaries stop producing the female hormones estrogen and progesterone. This can cause undesirable symptoms and also affect your health. Sometimes the symptoms may occur 4-5 years before the menopause begins. There is no relationship between menopause and:  Oral contraceptives.  Number of children you had.  Race.  The age your menstrual periods started (menarche).  Heavy smokers and very thin women may develop menopause earlier in life. What are the  causes?  The ovaries stop producing the female hormones estrogen and progesterone. Other causes include:  Surgery to remove both ovaries.  The ovaries stop functioning for no known reason.  Tumors of the pituitary gland in the brain.  Medical disease that affects the ovaries and hormone production.  Radiation treatment to the abdomen or pelvis.  Chemotherapy that affects the ovaries.  What are the signs or symptoms?  Hot flashes.  Night sweats.  Decrease in sex drive.  Vaginal dryness and thinning of the vagina causing painful intercourse.  Dryness of the skin and developing wrinkles.  Headaches.  Tiredness.  Irritability.  Memory problems.  Weight gain.  Bladder infections.  Hair growth of the face and chest.  Infertility. More serious symptoms include:  Loss of bone (osteoporosis) causing breaks (fractures).  Depression.  Hardening and narrowing of the arteries (atherosclerosis) causing heart attacks and strokes.  How is this diagnosed?  When the menstrual periods have stopped for 12 straight months.  Physical exam.  Hormone studies of the blood. How is this treated? There are many treatment choices and nearly as many questions about them. The decisions to treat or not to treat menopausal changes is an individual choice made with your health care provider. Your health care provider can discuss   the treatments with you. Together, you can decide which treatment will work best for you. Your treatment choices may include:  Hormone therapy (estrogen and progesterone).  Non-hormonal medicines.  Treating the individual symptoms with medicine (for example antidepressants for depression).  Herbal medicines that may help specific symptoms.  Counseling by a psychiatrist or psychologist.  Group therapy.  Lifestyle changes including: ? Eating healthy. ? Regular exercise. ? Limiting caffeine and alcohol. ? Stress management and meditation.  No  treatment.  Follow these instructions at home:  Take the medicine your health care provider gives you as directed.  Get plenty of sleep and rest.  Exercise regularly.  Eat a diet that contains calcium (good for the bones) and soy products (acts like estrogen hormone).  Avoid alcoholic beverages.  Do not smoke.  If you have hot flashes, dress in layers.  Take supplements, calcium, and vitamin D to strengthen bones.  You can use over-the-counter lubricants or moisturizers for vaginal dryness.  Group therapy is sometimes very helpful.  Acupuncture may be helpful in some cases. Contact a health care provider if:  You are not sure you are in menopause.  You are having menopausal symptoms and need advice and treatment.  You are still having menstrual periods after age 97 years.  You have pain with intercourse.  Menopause is complete (no menstrual period for 12 months) and you develop vaginal bleeding.  You need a referral to a specialist (gynecologist, psychiatrist, or psychologist) for treatment. Get help right away if:  You have severe depression.  You have excessive vaginal bleeding.  You fell and think you have a broken bone.  You have pain when you urinate.  You develop leg or chest pain.  You have a fast pounding heart beat (palpitations).  You have severe headaches.  You develop vision problems.  You feel a lump in your breast.  You have abdominal pain or severe indigestion. This information is not intended to replace advice given to you by your health care provider. Make sure you discuss any questions you have with your health care provider. Document Released: 01/20/2004 Document Revised: 04/06/2016 Document Reviewed: 05/29/2013 Elsevier Interactive Patient Education  2017 [place] is the time when your body begins to move into the menopause (no menstrual period for 12 straight months). It is a natural process.  Perimenopause can begin 2-8 years before the menopause and usually lasts for 1 year after the menopause. During this time, your ovaries may or may not produce an egg. The ovaries vary in their production of estrogen and progesterone hormones each month. This can cause irregular menstrual periods, difficulty getting pregnant, vaginal bleeding between periods, and uncomfortable symptoms. What are the causes?  Irregular production of the ovarian hormones, estrogen and progesterone, and not ovulating every month. Other causes include:  Tumor of the pituitary gland in the brain.  Medical disease that affects the ovaries.  Radiation treatment.  Chemotherapy.  Unknown causes.  Heavy smoking and excessive alcohol intake can bring on perimenopause sooner.  What are the signs or symptoms?  Hot flashes.  Night sweats.  Irregular menstrual periods.  Decreased sex drive.  Vaginal dryness.  Headaches.  Mood swings.  Depression.  Memory problems.  Irritability.  Tiredness.  Weight gain.  Trouble getting pregnant.  The beginning of losing bone cells (osteoporosis).  The beginning of hardening of the arteries (atherosclerosis). How is this diagnosed? Your health care provider will make a diagnosis by analyzing your age, menstrual history, and symptoms.  He or she will do a physical exam and note any changes in your body, especially your female organs. Female hormone tests may or may not be helpful depending on the amount of female hormones you produce and when you produce them. However, other hormone tests may be helpful to rule out other problems. How is this treated? In some cases, no treatment is needed. The decision on whether treatment is necessary during the perimenopause should be made by you and your health care provider based on how the symptoms are affecting you and your lifestyle. Various treatments are available, such as:  Treating individual symptoms with a specific  medicine for that symptom.  Herbal medicines that can help specific symptoms.  Counseling.  Group therapy.  Follow these instructions at home:  Keep track of your menstrual periods (when they occur, how heavy they are, how long between periods, and how long they last) as well as your symptoms and when they started.  Only take over-the-counter or prescription medicines as directed by your health care provider.  Sleep and rest.  Exercise.  Eat a diet that contains calcium (good for your bones) and soy (acts like the estrogen hormone).  Do not smoke.  Avoid alcoholic beverages.  Take vitamin supplements as recommended by your health care provider. Taking vitamin E may help in certain cases.  Take calcium and vitamin D supplements to help prevent bone loss.  Group therapy is sometimes helpful.  Acupuncture may help in some cases. Contact a health care provider if:  You have questions about any symptoms you are having.  You need a referral to a specialist (gynecologist, psychiatrist, or psychologist). Get help right away if:  You have vaginal bleeding.  Your period lasts longer than 8 days.  Your periods are recurring sooner than 21 days.  You have bleeding after intercourse.  You have severe depression.  You have pain when you urinate.  You have severe headaches.  You have vision problems. This information is not intended to replace advice given to you by your health care provider. Make sure you discuss any questions you have with your health care provider. Document Released: 12/07/2004 Document Revised: 04/06/2016 Document Reviewed: 05/29/2013 Elsevier Interactive Patient Education  2017 [employer].

## 2017-08-15 ENCOUNTER — Other Ambulatory Visit: Payer: Self-pay | Admitting: Internal Medicine

## 2017-08-16 ENCOUNTER — Other Ambulatory Visit: Payer: Self-pay | Admitting: Internal Medicine

## 2017-08-16 MED ORDER — ALBUTEROL SULFATE HFA 108 (90 BASE) MCG/ACT IN AERS: 2.0000 | INHALATION_SPRAY | Freq: Four times a day (QID) | RESPIRATORY_TRACT | 3 refills | Status: DC | PRN

## 2017-08-16 MED ORDER — CLONAZEPAM 1 MG PO TABS: 1.0000 mg | ORAL_TABLET | Freq: Three times a day (TID) | ORAL | 2 refills | Status: DC | PRN

## 2017-08-17 LAB — CYTOLOGY - PAP
Diagnosis: NEGATIVE
HPV (WINDOPATH): DETECTED

## 2017-08-20 ENCOUNTER — Telehealth: Payer: Self-pay | Admitting: Adult Health

## 2017-08-20 ENCOUNTER — Encounter: Payer: Self-pay | Admitting: Adult Health

## 2017-08-20 DIAGNOSIS — R87810 Cervical high risk human papillomavirus (HPV) DNA test positive: Secondary | ICD-10-CM | POA: Insufficient documentation

## 2017-08-20 HISTORY — DX: Cervical high risk human papillomavirus (HPV) DNA test positive: R87.810

## 2017-08-20 MED ORDER — METRONIDAZOLE 500 MG PO TABS: 500.0000 mg | ORAL_TABLET | Freq: Two times a day (BID) | ORAL | 0 refills | Status: DC

## 2017-08-20 NOTE — Telephone Encounter (Signed)
Left message that pap was negative for malignancy and +HPV with shift in flora.BV will rx flagyl.Need to repeat pap in 1 year, call with any questions

## 2017-08-21 ENCOUNTER — Other Ambulatory Visit: Payer: Self-pay

## 2017-08-21 ENCOUNTER — Telehealth: Payer: Self-pay | Admitting: Adult Health

## 2017-08-21 ENCOUNTER — Encounter: Payer: Self-pay | Admitting: Adult Health

## 2017-08-21 ENCOUNTER — Telehealth: Payer: Self-pay

## 2017-08-21 NOTE — Telephone Encounter (Signed)
Pt aware +HPV on pap, repeat in 1 year and that flagyl sent for BV

## 2017-08-21 NOTE — Telephone Encounter (Signed)
Pt called and left a vm requesting information from previous pap smear. Left message for pt to return call

## 2017-08-31 ENCOUNTER — Ambulatory Visit (HOSPITAL_COMMUNITY)
Admission: RE | Admit: 2017-08-31 | Discharge: 2017-08-31 | Disposition: A | Discharge status: Home / Self Care | Payer: Worker's Compensation | Source: Ambulatory Visit | Attending: Preventative Medicine | Admitting: Preventative Medicine

## 2017-08-31 ENCOUNTER — Other Ambulatory Visit (HOSPITAL_COMMUNITY): Payer: Self-pay | Admitting: Preventative Medicine

## 2017-08-31 DIAGNOSIS — T1490XA Injury, unspecified, initial encounter: Secondary | ICD-10-CM | POA: Insufficient documentation

## 2017-08-31 DIAGNOSIS — M1712 Unilateral primary osteoarthritis, left knee: Secondary | ICD-10-CM | POA: Diagnosis not present

## 2017-08-31 DIAGNOSIS — Y990 Civilian activity done for income or pay: Secondary | ICD-10-CM | POA: Diagnosis not present

## 2017-08-31 DIAGNOSIS — W19XXXA Unspecified fall, initial encounter: Secondary | ICD-10-CM | POA: Insufficient documentation

## 2017-08-31 DIAGNOSIS — M7989 Other specified soft tissue disorders: Secondary | ICD-10-CM | POA: Diagnosis not present

## 2017-08-31 DIAGNOSIS — Y9289 Other specified places as the place of occurrence of the external cause: Secondary | ICD-10-CM | POA: Diagnosis not present

## 2017-09-14 ENCOUNTER — Other Ambulatory Visit: Payer: Self-pay | Admitting: Internal Medicine

## 2017-10-15 ENCOUNTER — Other Ambulatory Visit: Payer: Self-pay | Admitting: Internal Medicine

## 2017-12-13 ENCOUNTER — Other Ambulatory Visit: Payer: Self-pay | Admitting: Internal Medicine

## 2018-01-11 ENCOUNTER — Other Ambulatory Visit: Payer: Self-pay | Admitting: Internal Medicine

## 2018-01-17 ENCOUNTER — Encounter: Payer: Self-pay | Admitting: Internal Medicine

## 2018-01-24 ENCOUNTER — Encounter: Payer: Self-pay | Admitting: Internal Medicine

## 2018-01-25 ENCOUNTER — Other Ambulatory Visit: Payer: Self-pay | Admitting: Internal Medicine

## 2018-01-25 DIAGNOSIS — Z1231 Encounter for screening mammogram for malignant neoplasm of breast: Secondary | ICD-10-CM

## 2018-01-29 ENCOUNTER — Ambulatory Visit
Admission: RE | Admit: 2018-01-29 | Discharge: 2018-01-29 | Disposition: A | Discharge status: Home / Self Care | Payer: 59 | Source: Ambulatory Visit | Attending: Internal Medicine | Admitting: Internal Medicine

## 2018-01-29 DIAGNOSIS — Z1231 Encounter for screening mammogram for malignant neoplasm of breast: Secondary | ICD-10-CM

## 2018-02-12 ENCOUNTER — Other Ambulatory Visit: Payer: Self-pay | Admitting: Internal Medicine

## 2018-03-14 ENCOUNTER — Other Ambulatory Visit: Payer: Self-pay | Admitting: Internal Medicine

## 2018-03-18 ENCOUNTER — Other Ambulatory Visit (INDEPENDENT_AMBULATORY_CARE_PROVIDER_SITE_OTHER): Payer: BLUE CROSS/BLUE SHIELD

## 2018-03-18 ENCOUNTER — Ambulatory Visit (INDEPENDENT_AMBULATORY_CARE_PROVIDER_SITE_OTHER): Payer: BLUE CROSS/BLUE SHIELD | Admitting: Internal Medicine

## 2018-03-18 ENCOUNTER — Encounter: Payer: Self-pay | Admitting: Internal Medicine

## 2018-03-18 VITALS — BP 126/82 | HR 85 | Temp 98.2°F | Ht 67.0 in | Wt 327.0 lb

## 2018-03-18 DIAGNOSIS — Z0000 Encounter for general adult medical examination without abnormal findings: Secondary | ICD-10-CM

## 2018-03-18 DIAGNOSIS — G8929 Other chronic pain: Secondary | ICD-10-CM

## 2018-03-18 DIAGNOSIS — Z6841 Body Mass Index (BMI) 40.0 and over, adult: Secondary | ICD-10-CM

## 2018-03-18 DIAGNOSIS — G4733 Obstructive sleep apnea (adult) (pediatric): Secondary | ICD-10-CM

## 2018-03-18 DIAGNOSIS — F4321 Adjustment disorder with depressed mood: Secondary | ICD-10-CM

## 2018-03-18 DIAGNOSIS — R609 Edema, unspecified: Secondary | ICD-10-CM | POA: Diagnosis not present

## 2018-03-18 DIAGNOSIS — Z9989 Dependence on other enabling machines and devices: Secondary | ICD-10-CM

## 2018-03-18 DIAGNOSIS — M5440 Lumbago with sciatica, unspecified side: Secondary | ICD-10-CM

## 2018-03-18 DIAGNOSIS — I10 Essential (primary) hypertension: Secondary | ICD-10-CM

## 2018-03-18 DIAGNOSIS — Z23 Encounter for immunization: Secondary | ICD-10-CM

## 2018-03-18 LAB — URINALYSIS
Hgb urine dipstick: NEGATIVE
KETONES UR: NEGATIVE
Leukocytes, UA: NEGATIVE
Nitrite: NEGATIVE
PH: 6.5 (ref 5.0–8.0)
SPECIFIC GRAVITY, URINE: 1.015 (ref 1.000–1.030)
Total Protein, Urine: NEGATIVE
URINE GLUCOSE: NEGATIVE
Urobilinogen, UA: 0.2 (ref 0.0–1.0)

## 2018-03-18 LAB — BASIC METABOLIC PANEL
BUN: 10 mg/dL (ref 6–23)
CHLORIDE: 104 meq/L (ref 96–112)
CO2: 27 mEq/L (ref 19–32)
Calcium: 9.5 mg/dL (ref 8.4–10.5)
Creatinine, Ser: 0.8 mg/dL (ref 0.40–1.20)
GFR: 82.58 mL/min (ref >60.00)
Glucose, Bld: 99 mg/dL (ref 70–99)
POTASSIUM: 4 meq/L (ref 3.5–5.1)
SODIUM: 139 meq/L (ref 135–145)

## 2018-03-18 LAB — LDL CHOLESTEROL, DIRECT: Direct LDL: 135 mg/dL

## 2018-03-18 LAB — VITAMIN D 25 HYDROXY (VIT D DEFICIENCY, FRACTURES): VITD: 26.17 ng/mL — AB (ref 30.00–100.00)

## 2018-03-18 LAB — HEPATIC FUNCTION PANEL
ALBUMIN: 4 g/dL (ref 3.5–5.2)
ALT: 36 U/L — AB (ref 0–35)
AST: 44 U/L — ABNORMAL HIGH (ref 0–37)
Alkaline Phosphatase: 52 U/L (ref 39–117)
BILIRUBIN DIRECT: 0.1 mg/dL (ref 0.0–0.3)
BILIRUBIN TOTAL: 0.4 mg/dL (ref 0.2–1.2)
TOTAL PROTEIN: 7 g/dL (ref 6.0–8.3)

## 2018-03-18 LAB — VITAMIN B12: Vitamin B-12: 471 pg/mL (ref 211–911)

## 2018-03-18 LAB — HEMOGLOBIN A1C: HEMOGLOBIN A1C: 6 % (ref 4.6–6.5)

## 2018-03-18 LAB — TSH: TSH: 0.84 u[IU]/mL (ref 0.35–4.50)

## 2018-03-18 LAB — LIPID PANEL
CHOL/HDL RATIO: 4
CHOLESTEROL: 209 mg/dL — AB (ref 0–200)
HDL: 53.4 mg/dL (ref >39.00)
NonHDL: 156.07
TRIGLYCERIDES: 225 mg/dL — AB (ref 0.0–149.0)
VLDL: 45 mg/dL — AB (ref 0.0–40.0)

## 2018-03-18 MED ORDER — TRAZODONE HCL 50 MG PO TABS: ORAL_TABLET | ORAL | 5 refills | Status: DC

## 2018-03-18 NOTE — Assessment & Plan Note (Signed)
We discussed age appropriate health related issues, including available/recomended screening tests and vaccinations. We discussed a need for adhering to healthy diet and exercise. Labs were ordered to be later reviewed . All questions were answered.   

## 2018-03-18 NOTE — Assessment & Plan Note (Signed)
CPAP.  

## 2018-03-18 NOTE — Progress Notes (Signed)
Subjective:  Patient ID: Sylvia Alvarado, female    DOB: 1971-09-24  Age: 47 y.o. MRN: [id number]  CC: No chief complaint on file.   HPI Sylvia Alvarado presents for a well exam. C/o L knee pain - s/p arthroscopic surgery - Dr Berenice Primas; meniscal injury On a light duty Mother died in December 08, 2017 suddenly of a heart attack C/o "brain fog" x3 mo  Outpatient Medications Prior to Visit  Medication Sig Dispense Refill  . albuterol (PROVENTIL HFA;VENTOLIN HFA) 108 (90 Base) MCG/ACT inhaler Inhale 2 puffs into the lungs every 6 (six) hours as needed for wheezing or shortness of breath. 18 g 3  . Cholecalciferol (VITAMIN D3) 1000 UNITS CAPS Take 1,000 Units by mouth daily.     . clonazePAM (KLONOPIN) 1 MG tablet TAKE (1) TABLET BY MOUTH THREE TIMES DAILY AS NEEDED. 90 tablet 1  . Cyanocobalamin (VITAMIN B-12) 500 MCG SUBL 1 sl qd (Patient taking differently: Place 500 mcg under the tongue daily. ) 100 tablet 3  . cyclobenzaprine (FLEXERIL) 10 MG tablet TAKE (1) TABLET BY MOUTH TWICE DAILY. 60 tablet 3  . diclofenac sodium (VOLTAREN) 1 % GEL APPLY TO AFFECTED AREAS TWICE DAILY AS NEEDED. 200 g 2  . DULoxetine (CYMBALTA) 60 MG capsule TAKE (1) CAPSULE BY MOUTH ONCE DAILY. 30 capsule 5  . gabapentin (NEURONTIN) 100 MG capsule TAKE 1 TO 3 CAPSULES BY MOUTH AT BEDTIME. 90 capsule 5  . metFORMIN (GLUCOPHAGE) 500 MG tablet TAKE ONE TABLET BY MOUTH ONCE DAILY WITH BREAKFAST. 30 tablet 11  . naproxen (NAPROSYN) 500 MG tablet Take 1 tablet (500 mg total) by mouth 2 (two) times daily as needed for moderate pain. 60 tablet 5  . traZODone (DESYREL) 50 MG tablet TAKE (1) TABLET BY MOUTH AT BEDTIME. 30 tablet 5  . triamcinolone cream (KENALOG) 0.1 % Apply 1 application topically 2 (two) times daily. 30 g 0  . Turmeric 450 MG CAPS Take 450 mg by mouth daily.    [name] azithromycin (ZITHROMAX) 250 MG tablet As directed (Patient not taking: Reported on 08/14/2017) 6 tablet 0  . cyclobenzaprine (FLEXERIL) 10 MG tablet TAKE  (1) TABLET BY MOUTH TWICE DAILY. 60 tablet 0  . dexamethasone (DECADRON) 4 MG tablet Take 1 tablet (4 mg total) by mouth 2 (two) times daily with a meal. (Patient not taking: Reported on 08/14/2017) 12 tablet 0  . dexlansoprazole (DEXILANT) 60 MG capsule Take 1 capsule (60 mg total) by mouth daily. 20 capsule 0  . metroNIDAZOLE (FLAGYL) 500 MG tablet Take 1 tablet (500 mg total) by mouth 2 (two) times daily. 14 tablet 0  . metroNIDAZOLE (METROGEL) 0.75 % gel Apply 1 application topically at bedtime. (Patient not taking: Reported on 08/14/2017) 45 g 3   No facility-administered medications prior to visit.     ROS Review of Systems  Constitutional: Positive for unexpected weight change. Negative for activity change, appetite change, chills and fatigue.  HENT: Negative for congestion, mouth sores and sinus pressure.   Eyes: Negative for visual disturbance.  Respiratory: Negative for cough and chest tightness.   Gastrointestinal: Negative for abdominal pain and nausea.  Genitourinary: Negative for difficulty urinating, frequency and vaginal pain.  Musculoskeletal: Positive for arthralgias and gait problem. Negative for back pain.  Skin: Negative for pallor and rash.  Neurological: Negative for dizziness, tremors, weakness, numbness and headaches.  Psychiatric/Behavioral: Positive for decreased concentration and dysphoric mood. Negative for confusion, sleep disturbance and suicidal ideas.    Objective:  BP  126/82 (BP Location: Left Arm, Patient Position: Sitting, Cuff Size: Large)   Pulse 85   Temp 98.2 F (36.8 C) (Oral)   Ht 5\' 7"  (1.702 m)   Wt (!) 327 lb (148.3 kg)   SpO2 99%   BMI 51.22 kg/m   BP Readings from Last 3 Encounters:  03/18/18 126/82  08/14/17 130/90  05/10/17 136/80    Wt Readings from Last 3 Encounters:  03/18/18 (!) 327 lb (148.3 kg)  08/14/17 294 lb (133.4 kg)  05/09/17 275 lb (124.7 kg)    Physical Exam  Constitutional: She appears well-developed. No  distress.  HENT:  Head: Normocephalic.  Right Ear: External ear normal.  Left Ear: External ear normal.  Nose: Nose normal.  Mouth/Throat: Oropharynx is clear and moist.  Eyes: Pupils are equal, round, and reactive to light. Conjunctivae are normal. Right eye exhibits no discharge. Left eye exhibits no discharge.  Neck: Normal range of motion. Neck supple. No JVD present. No tracheal deviation present. No thyromegaly present.  Cardiovascular: Normal rate, regular rhythm and normal heart sounds.  Pulmonary/Chest: No stridor. No respiratory distress. She has no wheezes.  Abdominal: Soft. Bowel sounds are normal. She exhibits no distension and no mass. There is no tenderness. There is no rebound and no guarding.  Musculoskeletal: She exhibits tenderness. She exhibits no edema.  Lymphadenopathy:    She has no cervical adenopathy.  Neurological: She displays normal reflexes. No cranial nerve deficit. She exhibits normal muscle tone. Coordination normal.  Skin: No rash noted. No erythema.  Psychiatric: She has a normal mood and affect. Her behavior is normal. Judgment and thought content normal.    Lab Results  Component Value Date   WBC 6.4 02/22/2017   HGB 13.0 02/22/2017   HCT 39.8 02/22/2017   PLT 226.0 02/22/2017   GLUCOSE 93 02/22/2017   CHOL 196 02/22/2017   TRIG 124.0 02/22/2017   HDL 58.10 02/22/2017   LDLDIRECT 145.5 09/12/2007   LDLCALC 113 (H) 02/22/2017   ALT 10 02/22/2017   AST 13 02/22/2017   NA 140 02/22/2017   K 4.0 02/22/2017   CL 106 02/22/2017   CREATININE 0.83 02/22/2017   BUN 13 02/22/2017   CO2 28 02/22/2017   TSH 0.65 02/22/2017   HGBA1C 5.6 07/28/2016    Mm Screening Breast Tomo Bilateral  Result Date: 01/29/2018 CLINICAL DATA:  Screening. EXAM: DIGITAL SCREENING BILATERAL MAMMOGRAM WITH TOMO AND CAD COMPARISON:  Previous exam(s). ACR Breast Density Category a: The breast tissue is almost entirely fatty. FINDINGS: There are no findings suspicious for  malignancy. Images were processed with CAD. IMPRESSION: No mammographic evidence of malignancy. A result letter of this screening mammogram will be mailed directly to the patient. RECOMMENDATION: Screening mammogram in one year. (Code:SM-B-01Y) BI-RADS CATEGORY  1: Negative. Electronically Signed   By: Franki Cabot M.D.   On: 01/29/2018 12:41    Assessment & Plan:   There are no diagnoses linked to this encounter. I have discontinued Jaclyne L. Allen's metroNIDAZOLE, azithromycin, dexamethasone, dexlansoprazole, and metroNIDAZOLE. I am also having her maintain her Vitamin D3, Turmeric, Vitamin B-12, diclofenac sodium, naproxen, triamcinolone cream, albuterol, cyclobenzaprine, DULoxetine, traZODone, gabapentin, metFORMIN, and clonazePAM.  No orders of the defined types were placed in this encounter.    Follow-up: No follow-ups on file.  Walker Kehr, MD

## 2018-03-18 NOTE — Assessment & Plan Note (Signed)
Brain fog discussed Hold one Rx at the time to see if better Grief counseling is suggested

## 2018-03-18 NOTE — Assessment & Plan Note (Signed)
BP Readings from Last 3 Encounters:  03/18/18 126/82  08/14/17 130/90  05/10/17 136/80

## 2018-03-18 NOTE — Assessment & Plan Note (Signed)
Mother died in Dec 2018 suddenly of a heart attack Discussed

## 2018-03-18 NOTE — Assessment & Plan Note (Signed)
ref to Dr Beasley 

## 2018-03-18 NOTE — Assessment & Plan Note (Signed)
Lodine prn  Potential benefits of a long term NSAIDs use as well as potential risks  and complications were explained to the patient and were aknowledged.

## 2018-03-18 NOTE — Assessment & Plan Note (Signed)
No edema

## 2018-03-19 ENCOUNTER — Encounter: Payer: Self-pay | Admitting: Internal Medicine

## 2018-03-19 ENCOUNTER — Other Ambulatory Visit: Payer: Self-pay | Admitting: Internal Medicine

## 2018-03-19 DIAGNOSIS — R945 Abnormal results of liver function studies: Principal | ICD-10-CM

## 2018-03-19 DIAGNOSIS — R7989 Other specified abnormal findings of blood chemistry: Secondary | ICD-10-CM

## 2018-03-19 MED ORDER — VITAMIN D3 1.25 MG (50000 UT) PO CAPS: 1.0000 | ORAL_CAPSULE | ORAL | 0 refills | Status: DC

## 2018-03-20 NOTE — Addendum Note (Signed)
Addended by: Karren Cobble on: 03/20/2018 08:38 AM   Modules accepted: Orders

## 2018-03-21 ENCOUNTER — Other Ambulatory Visit: Payer: Self-pay | Admitting: Internal Medicine

## 2018-03-21 MED ORDER — FUROSEMIDE 20 MG PO TABS: ORAL_TABLET | ORAL | 3 refills | Status: DC

## 2018-04-03 ENCOUNTER — Encounter (INDEPENDENT_AMBULATORY_CARE_PROVIDER_SITE_OTHER): Payer: Self-pay

## 2018-04-03 ENCOUNTER — Ambulatory Visit
Admission: RE | Admit: 2018-04-03 | Discharge: 2018-04-03 | Disposition: A | Discharge status: Home / Self Care | Payer: BLUE CROSS/BLUE SHIELD | Source: Ambulatory Visit | Attending: Internal Medicine | Admitting: Internal Medicine

## 2018-04-03 DIAGNOSIS — R945 Abnormal results of liver function studies: Principal | ICD-10-CM

## 2018-04-03 DIAGNOSIS — K760 Fatty (change of) liver, not elsewhere classified: Secondary | ICD-10-CM | POA: Diagnosis not present

## 2018-04-03 DIAGNOSIS — R7989 Other specified abnormal findings of blood chemistry: Secondary | ICD-10-CM

## 2018-04-04 ENCOUNTER — Encounter: Payer: Self-pay | Admitting: Internal Medicine

## 2018-04-15 ENCOUNTER — Other Ambulatory Visit: Payer: Self-pay | Admitting: Internal Medicine

## 2018-04-22 ENCOUNTER — Encounter: Payer: Self-pay | Admitting: Internal Medicine

## 2018-04-23 ENCOUNTER — Encounter: Payer: Self-pay | Admitting: Internal Medicine

## 2018-04-23 NOTE — Telephone Encounter (Signed)
Sylvia Alvarado to call patient

## 2018-04-24 ENCOUNTER — Encounter: Payer: Self-pay | Admitting: Family

## 2018-04-24 ENCOUNTER — Ambulatory Visit: Payer: BLUE CROSS/BLUE SHIELD | Admitting: Family

## 2018-04-24 ENCOUNTER — Ambulatory Visit (INDEPENDENT_AMBULATORY_CARE_PROVIDER_SITE_OTHER)
Admission: RE | Admit: 2018-04-24 | Discharge: 2018-04-24 | Disposition: A | Discharge status: Home / Self Care | Payer: BLUE CROSS/BLUE SHIELD | Source: Ambulatory Visit | Attending: Family | Admitting: Family

## 2018-04-24 VITALS — BP 132/84 | HR 80 | Temp 98.4°F | Ht 67.0 in | Wt 328.1 lb

## 2018-04-24 DIAGNOSIS — R0602 Shortness of breath: Secondary | ICD-10-CM

## 2018-04-24 DIAGNOSIS — R079 Chest pain, unspecified: Secondary | ICD-10-CM | POA: Diagnosis not present

## 2018-04-24 MED ORDER — PANTOPRAZOLE SODIUM 40 MG PO TBEC: 40.0000 mg | DELAYED_RELEASE_TABLET | Freq: Two times a day (BID) | ORAL | 0 refills | Status: DC

## 2018-04-24 NOTE — Progress Notes (Signed)
Sylvia Alvarado is a 47 y.o. female with the following history as recorded in EpicCare:  Patient Active Problem List   Diagnosis Date Noted  . Grief 03/18/2018  . Cervical high risk HPV (human papillomavirus) test positive 08/20/2017  . Gastroesophageal reflux disease without esophagitis 08/14/2017  . Nausea 08/14/2017  . Rectocele 08/14/2017  . Plantar fasciitis, right 03/07/2017  . Acute sinusitis 03/07/2017  . Genetic testing 06/30/2016  . Chest pain 12/08/2014  . Carpal tunnel syndrome 08/24/2014  . Rash and nonspecific skin eruption 06/15/2014  . Unspecified symptom associated with female genital organs 05/23/2013  . PMS (premenstrual syndrome) 05/23/2013  . Stress at work 03/21/2013  . OSA on CPAP 11/15/2012  . Well adult exam 11/15/2012  . Excessive anger 09/06/2011  . Fibromyalgia 09/06/2011  . SOMNOLENCE 08/01/2010  . FATIGUE 08/01/2010  . SNORING 08/01/2010  . OBSTRUCTIVE SLEEP APNEA 04/14/2010  . PARESTHESIA 04/14/2010  . Edema 04/14/2010  . RASH AND OTHER NONSPECIFIC SKIN ERUPTION 01/11/2009  . ELEVATED BP 09/30/2008  . HEMATURIA UNSPECIFIED 07/30/2008  . FLANK PAIN, RIGHT 07/30/2008  . RESTLESS LEG SYNDROME 04/16/2008  . ALLERGIC RHINITIS 04/16/2008  . REDUCTION MAMMOPLASTY, HX OF 04/16/2008  . Morbid obesity with BMI of 45.0-49.9, adult ([place]) 09/12/2007  . Situational depression 09/12/2007  . Essential hypertension 09/12/2007  . ASTHMA 09/12/2007  . ROSACEA 09/12/2007  . LOW BACK PAIN 09/12/2007    Current Outpatient Medications  Medication Sig Dispense Refill  . albuterol (PROVENTIL HFA;VENTOLIN HFA) 108 (90 Base) MCG/ACT inhaler Inhale 2 puffs into the lungs every 6 (six) hours as needed for wheezing or shortness of breath. 18 g 3  . Cholecalciferol (VITAMIN D3) 1000 UNITS CAPS Take 1,000 Units by mouth daily.     . Cholecalciferol (VITAMIN D3) 50000 units CAPS Take 1 capsule by mouth once a week. 6 capsule 0  . clonazePAM (KLONOPIN) 1 MG tablet TAKE (1)  TABLET BY MOUTH THREE TIMES DAILY AS NEEDED. 90 tablet 1  . Cyanocobalamin (VITAMIN B-12) 500 MCG SUBL 1 sl qd (Patient taking differently: Place 500 mcg under the tongue daily. ) 100 tablet 3  . cyclobenzaprine (FLEXERIL) 10 MG tablet TAKE (1) TABLET BY MOUTH TWICE DAILY. 60 tablet 3  . diclofenac sodium (VOLTAREN) 1 % GEL APPLY TO AFFECTED AREAS TWICE DAILY AS NEEDED. 200 g 2  . DULoxetine (CYMBALTA) 60 MG capsule TAKE (1) CAPSULE BY MOUTH ONCE DAILY. 30 capsule 5  . etodolac (LODINE) 400 MG tablet   0  . furosemide (LASIX) 20 MG tablet 1po prn swelling 30 tablet 3  . metFORMIN (GLUCOPHAGE) 500 MG tablet TAKE ONE TABLET BY MOUTH ONCE DAILY WITH BREAKFAST. 30 tablet 11  . Vitamin D, Ergocalciferol, (DRISDOL) 50000 units CAPS capsule   0  . pantoprazole (PROTONIX) 40 MG tablet Take 1 tablet (40 mg total) by mouth 2 (two) times daily. 60 tablet 0   No current facility-administered medications for this visit.     Allergies: Ivp dye [iodinated diagnostic agents]; Butrans [buprenorphine]; and Ropinirole hydrochloride  Past Medical History:  Diagnosis Date  . Allergy   . Asthma   . Cervical high risk HPV (human papillomavirus) test positive 08/20/2017  . Depression   . Hypertension   . Low back pain   . Obesity   . OSA (obstructive sleep apnea)    on CPAP Dr Gwenette Greet  . Vaginal Pap smear, abnormal     Past Surgical History:  Procedure Laterality Date  . ARTHROSCOPY KNEE W/ DRILLING    .  BREAST REDUCTION SURGERY  1993  . ENDOMETRIAL ABLATION    . REDUCTION MAMMAPLASTY Bilateral   . TUBAL LIGATION      Family History  Problem Relation Age of Onset  . Cancer Mother        Uterine  . Stroke Mother   . Heart disease Mother 58  . Emphysema Father   . Heart disease Father   . Cancer Maternal Grandfather        Brain  . Arthritis Other   . Heart disease Other        A Fib  . 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 Pt's mom has a 50% chance - not tested yet.   . Fibroids Maternal Grandmother   . Emphysema Sister   . Endometriosis Sister     Social History   Tobacco Use  . Smoking status: Former Research scientist (life sciences)  . Smokeless tobacco: Never Used  Substance Use Topics  . Alcohol use: No    Subjective:  Patient presents with worsening problems with heartburn/ reflux symptoms x 3-4 weeks; has been feeling increased sense of bloating/ nauseated/ more short of breath; had sensation of chest pressure yesterday; no chest pain on exertion; does have asthma- had to use albuterol yesterday; hesitant to take any type of PPI due to underlying NASH; talked to her GYN about similar symptoms in October and was prescribed Dexilant- opted not to take; had abdominal ultrasound recently that showed no gallstones; strong FH of gallbladder disease however;      Objective:  Vitals:   04/24/18 0924  BP: 132/84  Pulse: 80  Temp: 98.4 F (36.9 C)  TempSrc: Oral  SpO2: 96%  Weight: (!) 328 lb 1.9 oz (148.8 kg)  Height: 5\' 7"  (1.702 m)    General: Well developed, well nourished, in no acute distress  Skin : Warm and dry.  Head: Normocephalic and atraumatic  Eyes: Sclera and conjunctiva clear; pupils round and reactive to light; extraocular movements intact  Ears: External normal; canals clear; tympanic membranes normal  Oropharynx: Pink, supple. No suspicious lesions  Neck: Supple without thyromegaly, adenopathy  Lungs: Respirations unlabored; clear to auscultation bilaterally without wheeze, rales, rhonchi  CVS exam: normal rate and regular rhythm.  Abdomen: Soft; nontender; nondistended; normoactive bowel sounds; no masses or hepatosplenomegaly  Neurologic: Alert and oriented; speech intact; face symmetrical; moves all extremities well; CNII-XII intact without focal deficit   Assessment:  1. Chest pain, unspecified type   2. Shortness of breath      Plan:  Suspect EKG- NSR; update CXR today; suspect GERD- Rx for Protonix 40 mg bid; plan for follow-up with her PCP in 1 month; if no improvement in 1 week, call back and will schedule for HIDA scan.   Return in about 1 month (around 05/22/2018) for with Dr. Alain Marion.  Orders Placed This Encounter  Procedures  . DG Chest 2 View    Standing Status:   Future    Standing Expiration Date:   06/25/2019    Order Specific Question:   Reason for Exam (SYMPTOM  OR DIAGNOSIS REQUIRED)    Answer:   short of breath    Order Specific Question:   Is patient pregnant?    Answer:   No    Order Specific Question:   Preferred imaging location?    Answer:   [facility]  Order Specific Question:   Radiology Contrast Protocol - do NOT remove file path    Answer:   [email or web address]  . EKG 12-Lead    Requested Prescriptions   Signed Prescriptions Disp Refills  . pantoprazole (PROTONIX) 40 MG tablet 60 tablet 0    Sig: Take 1 tablet (40 mg total) by mouth 2 (two) times daily.

## 2018-04-24 NOTE — Telephone Encounter (Signed)
From: [name]  Sent: 04/23/2018  6:51 AM  To: [facility]  Subject: Non-Urgent Medical Question             ----- Message from [place], Generic sent at 04/23/2018 6:51 AM EDT -----    I did have a cortisone shot in my left knee yesterday. But I have been feeling bloated, ribs feel tight, nauseous before I eat and nauseous when I eat, shortness of breath for a week or two now. Last night Everytime I moved I felt like a bubbling beaker from the movies that is supposed to contain acid.

## 2018-05-13 ENCOUNTER — Other Ambulatory Visit: Payer: Self-pay | Admitting: Internal Medicine

## 2018-05-28 ENCOUNTER — Encounter: Payer: Self-pay | Admitting: Internal Medicine

## 2018-05-28 ENCOUNTER — Ambulatory Visit: Payer: BLUE CROSS/BLUE SHIELD | Admitting: Internal Medicine

## 2018-05-28 ENCOUNTER — Other Ambulatory Visit (INDEPENDENT_AMBULATORY_CARE_PROVIDER_SITE_OTHER): Payer: BLUE CROSS/BLUE SHIELD

## 2018-05-28 DIAGNOSIS — G2581 Restless legs syndrome: Secondary | ICD-10-CM

## 2018-05-28 DIAGNOSIS — K219 Gastro-esophageal reflux disease without esophagitis: Secondary | ICD-10-CM

## 2018-05-28 DIAGNOSIS — Z6841 Body Mass Index (BMI) 40.0 and over, adult: Secondary | ICD-10-CM

## 2018-05-28 DIAGNOSIS — J4521 Mild intermittent asthma with (acute) exacerbation: Secondary | ICD-10-CM

## 2018-05-28 DIAGNOSIS — F4321 Adjustment disorder with depressed mood: Secondary | ICD-10-CM

## 2018-05-28 DIAGNOSIS — I10 Essential (primary) hypertension: Secondary | ICD-10-CM | POA: Diagnosis not present

## 2018-05-28 DIAGNOSIS — K760 Fatty (change of) liver, not elsewhere classified: Secondary | ICD-10-CM | POA: Diagnosis not present

## 2018-05-28 LAB — HEPATIC FUNCTION PANEL
ALBUMIN: 4.3 g/dL (ref 3.5–5.2)
ALK PHOS: 59 U/L (ref 39–117)
ALT: 35 U/L (ref 0–35)
AST: 37 U/L (ref 0–37)
Bilirubin, Direct: 0.1 mg/dL (ref 0.0–0.3)
TOTAL PROTEIN: 7.6 g/dL (ref 6.0–8.3)
Total Bilirubin: 0.5 mg/dL (ref 0.2–1.2)

## 2018-05-28 LAB — BASIC METABOLIC PANEL
BUN: 17 mg/dL (ref 6–23)
CO2: 28 mEq/L (ref 19–32)
Calcium: 9.4 mg/dL (ref 8.4–10.5)
Chloride: 102 mEq/L (ref 96–112)
Creatinine, Ser: 0.83 mg/dL (ref 0.40–1.20)
GFR: 79.08 mL/min (ref >60.00)
GLUCOSE: 98 mg/dL (ref 70–99)
POTASSIUM: 3.7 meq/L (ref 3.5–5.1)
SODIUM: 137 meq/L (ref 135–145)

## 2018-05-28 MED ORDER — DULOXETINE HCL 30 MG PO CPEP: 30.0000 mg | ORAL_CAPSULE | Freq: Every day | ORAL | 5 refills | Status: DC

## 2018-05-28 MED ORDER — CLONAZEPAM 1 MG PO TABS: ORAL_TABLET | ORAL | 2 refills | Status: DC

## 2018-05-28 MED ORDER — BUDESONIDE-FORMOTEROL FUMARATE 160-4.5 MCG/ACT IN AERO: 2.0000 | INHALATION_SPRAY | Freq: Two times a day (BID) | RESPIRATORY_TRACT | 6 refills | Status: DC

## 2018-05-28 MED ORDER — PANTOPRAZOLE SODIUM 40 MG PO TBEC: 40.0000 mg | DELAYED_RELEASE_TABLET | Freq: Two times a day (BID) | ORAL | 11 refills | Status: DC

## 2018-05-28 NOTE — Assessment & Plan Note (Signed)
A little better 

## 2018-05-28 NOTE — Assessment & Plan Note (Signed)
Doing well 

## 2018-05-28 NOTE — Assessment & Plan Note (Signed)
"  brain fog" - better off Trazodone, Gabapentin Getting married in Dec 2019

## 2018-05-28 NOTE — Progress Notes (Signed)
Subjective:  Patient ID: Sylvia Alvarado, female    DOB: 04/20/1971  Age: 47 y.o. MRN: [id number]  CC: No chief complaint on file.   HPI Sylvia Alvarado presents for GERD/CP - better C/o SOB in the heat C/o L knee pain - injections F/u "brain fog" - better off Trazodone, Gabapentin  Getting married in Dec 2019  Outpatient Medications Prior to Visit  Medication Sig Dispense Refill  . albuterol (PROVENTIL HFA;VENTOLIN HFA) 108 (90 Base) MCG/ACT inhaler Inhale 2 puffs into the lungs every 6 (six) hours as needed for wheezing or shortness of breath. 18 g 3  . Cholecalciferol (VITAMIN D3) 1000 UNITS CAPS Take 1,000 Units by mouth daily.     . Cholecalciferol (VITAMIN D3) 50000 units CAPS Take 1 capsule by mouth once a week. 6 capsule 0  . clonazePAM (KLONOPIN) 1 MG tablet TAKE (1) TABLET BY MOUTH THREE TIMES DAILY AS NEEDED. 90 tablet 1  . Cyanocobalamin (VITAMIN B-12) 500 MCG SUBL 1 sl qd (Patient taking differently: Place 500 mcg under the tongue daily. ) 100 tablet 3  . cyclobenzaprine (FLEXERIL) 10 MG tablet TAKE (1) TABLET BY MOUTH TWICE DAILY. 60 tablet 3  . diclofenac sodium (VOLTAREN) 1 % GEL APPLY TO AFFECTED AREAS TWICE DAILY AS NEEDED. 200 g 2  . DULoxetine (CYMBALTA) 60 MG capsule TAKE (1) CAPSULE BY MOUTH ONCE DAILY. 30 capsule 5  . etodolac (LODINE) 400 MG tablet   0  . furosemide (LASIX) 20 MG tablet 1po prn swelling 30 tablet 3  . metFORMIN (GLUCOPHAGE) 500 MG tablet TAKE ONE TABLET BY MOUTH ONCE DAILY WITH BREAKFAST. 30 tablet 11  . pantoprazole (PROTONIX) 40 MG tablet Take 1 tablet (40 mg total) by mouth 2 (two) times daily. 60 tablet 0  . Vitamin D, Ergocalciferol, (DRISDOL) 50000 units CAPS capsule   0   No facility-administered medications prior to visit.     ROS: Review of Systems  Constitutional: Positive for fatigue. Negative for activity change, appetite change, chills and unexpected weight change.  HENT: Negative for congestion, mouth sores and sinus pressure.    Eyes: Negative for visual disturbance.  Respiratory: Negative for cough and chest tightness.   Gastrointestinal: Negative for abdominal pain and nausea.  Genitourinary: Negative for difficulty urinating, frequency and vaginal pain.  Musculoskeletal: Positive for arthralgias, back pain and gait problem.  Skin: Negative for pallor and rash.  Neurological: Negative for dizziness, tremors, weakness, numbness and headaches.  Psychiatric/Behavioral: Positive for dysphoric mood. Negative for confusion, sleep disturbance and suicidal ideas. The patient is nervous/anxious.     Objective:  BP 124/88 (BP Location: Left Arm, Patient Position: Sitting, Cuff Size: Large)   Pulse 90   Temp 97.9 F (36.6 C) (Oral)   Ht 5\' 7"  (1.702 m)   Wt (!) 328 lb (148.8 kg)   SpO2 96%   BMI 51.37 kg/m   BP Readings from Last 3 Encounters:  05/28/18 124/88  04/24/18 132/84  03/18/18 126/82    Wt Readings from Last 3 Encounters:  05/28/18 (!) 328 lb (148.8 kg)  04/24/18 (!) 328 lb 1.9 oz (148.8 kg)  03/18/18 (!) 327 lb (148.3 kg)    Physical Exam  Constitutional: She appears well-developed. No distress.  HENT:  Head: Normocephalic.  Right Ear: External ear normal.  Left Ear: External ear normal.  Nose: Nose normal.  Mouth/Throat: Oropharynx is clear and moist.  Eyes: Pupils are equal, round, and reactive to light. Conjunctivae are normal. Right eye exhibits no discharge.  Left eye exhibits no discharge.  Neck: Normal range of motion. Neck supple. No JVD present. No tracheal deviation present. No thyromegaly present.  Cardiovascular: Normal rate, regular rhythm and normal heart sounds.  Pulmonary/Chest: No stridor. No respiratory distress. She has no wheezes.  Abdominal: Soft. Bowel sounds are normal. She exhibits no distension and no mass. There is no tenderness. There is no rebound and no guarding.  Musculoskeletal: She exhibits tenderness. She exhibits no edema.  Lymphadenopathy:    She has no  cervical adenopathy.  Neurological: She displays normal reflexes. No cranial nerve deficit. She exhibits normal muscle tone. Coordination normal.  Skin: No rash noted. No erythema.  Psychiatric: She has a normal mood and affect. Her behavior is normal. Judgment and thought content normal.  LUQ sensitive L knee tender Obese  Lab Results  Component Value Date   WBC 6.4 02/22/2017   HGB 13.0 02/22/2017   HCT 39.8 02/22/2017   PLT 226.0 02/22/2017   GLUCOSE 99 03/18/2018   CHOL 209 (H) 03/18/2018   TRIG 225.0 (H) 03/18/2018   HDL 53.40 03/18/2018   LDLDIRECT 135.0 03/18/2018   LDLCALC 113 (H) 02/22/2017   ALT 36 (H) 03/18/2018   AST 44 (H) 03/18/2018   NA 139 03/18/2018   K 4.0 03/18/2018   CL 104 03/18/2018   CREATININE 0.80 03/18/2018   BUN 10 03/18/2018   CO2 27 03/18/2018   TSH 0.84 03/18/2018   HGBA1C 6.0 03/18/2018    Dg Chest 2 View  Result Date: 04/24/2018 CLINICAL DATA:  Chest pain and dyspnea for 1 month EXAM: CHEST - 2 VIEW COMPARISON:  02/10/2016 FINDINGS: The heart size and mediastinal contours are within normal limits. Both lungs are clear. The visualized skeletal structures are unremarkable. IMPRESSION: No active cardiopulmonary disease. Electronically Signed   By: Inez Catalina M.D.   On: 04/24/2018 11:37    Assessment & Plan:   There are no diagnoses linked to this encounter.   No orders of the defined types were placed in this encounter.    Follow-up: No follow-ups on file.  Walker Kehr, MD

## 2018-05-28 NOTE — Patient Instructions (Addendum)
Pasta al dente

## 2018-05-28 NOTE — Assessment & Plan Note (Signed)
Cymbalta 

## 2018-05-28 NOTE — Assessment & Plan Note (Addendum)
Protonix Better on protonix D/c Lodine Check H pylori

## 2018-05-28 NOTE — Assessment & Plan Note (Signed)
Loose wt Low carb diet

## 2018-05-28 NOTE — Assessment & Plan Note (Signed)
Lab Results  Component Value Date   WBC 6.4 02/22/2017

## 2018-05-28 NOTE — Assessment & Plan Note (Signed)
Added Symbicort Cont Proair prn

## 2018-05-29 LAB — H. PYLORI ANTIBODY, IGG: H Pylori IgG: NEGATIVE

## 2018-07-01 ENCOUNTER — Other Ambulatory Visit: Payer: Self-pay | Admitting: Internal Medicine

## 2018-08-30 DIAGNOSIS — Z23 Encounter for immunization: Secondary | ICD-10-CM | POA: Diagnosis not present

## 2018-09-05 ENCOUNTER — Other Ambulatory Visit: Payer: Self-pay | Admitting: Internal Medicine

## 2018-11-02 ENCOUNTER — Other Ambulatory Visit: Payer: Self-pay | Admitting: Internal Medicine

## 2018-11-04 MED ORDER — CLONAZEPAM 1 MG PO TABS: ORAL_TABLET | ORAL | 2 refills | Status: DC

## 2018-11-26 IMAGING — DX DG FACIAL BONES COMPLETE 3+V
4 series · 4 of 4 positions shown · non-contrast
Comparison: None.

CLINICAL DATA: Fall.  Right facial pain and abrasions.

EXAM:
FACIAL BONES COMPLETE 3+V

[facial waters]
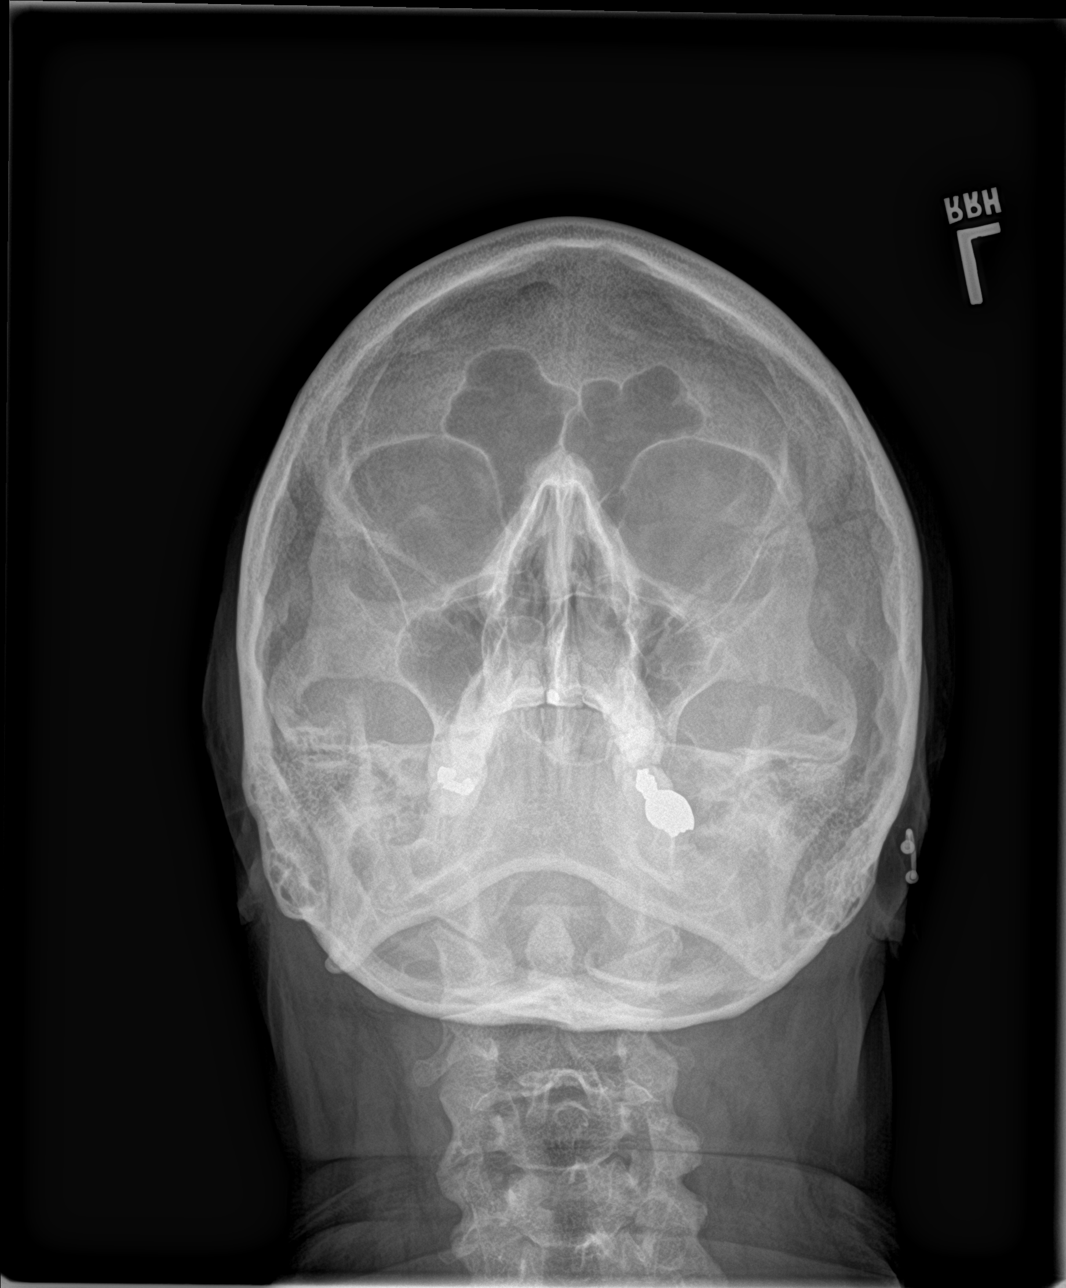

[facial lateral]
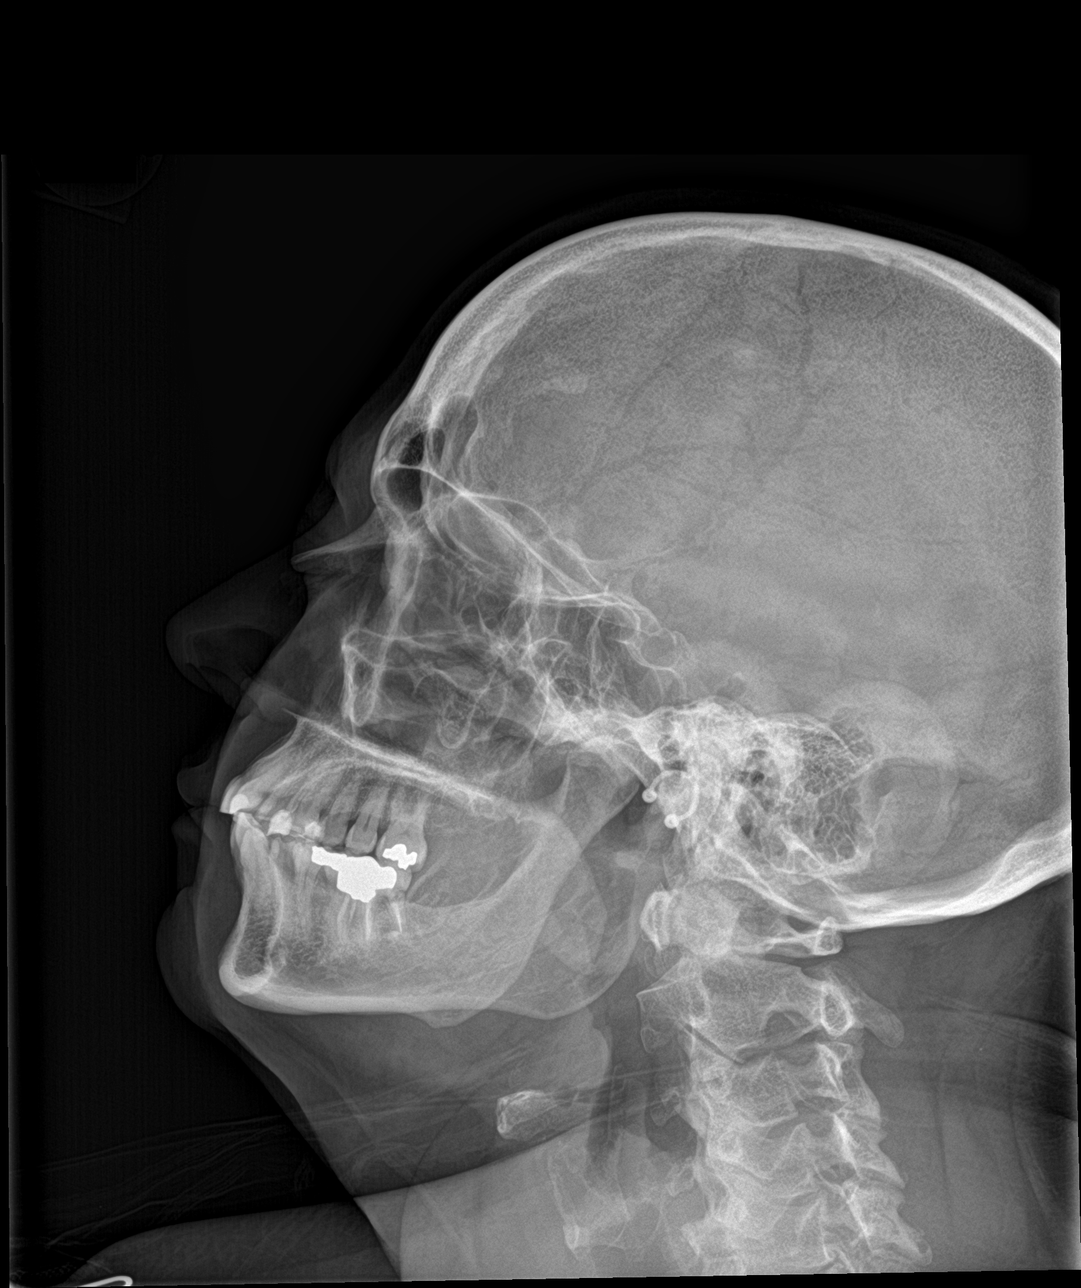

[facial smv]
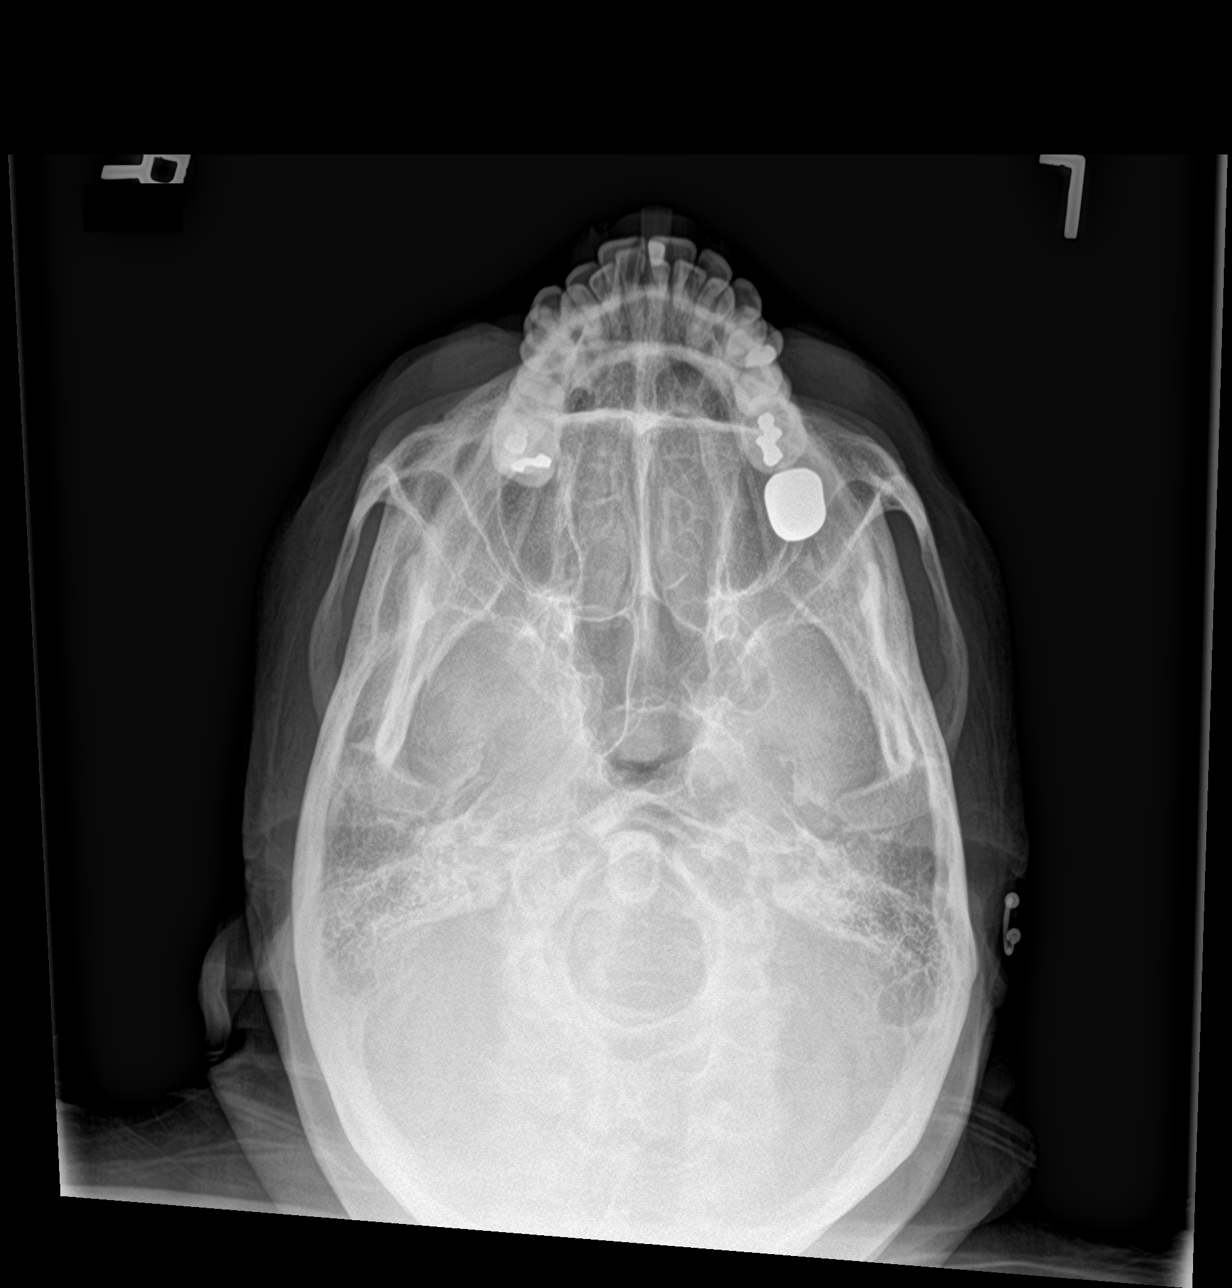

[mandible townes]
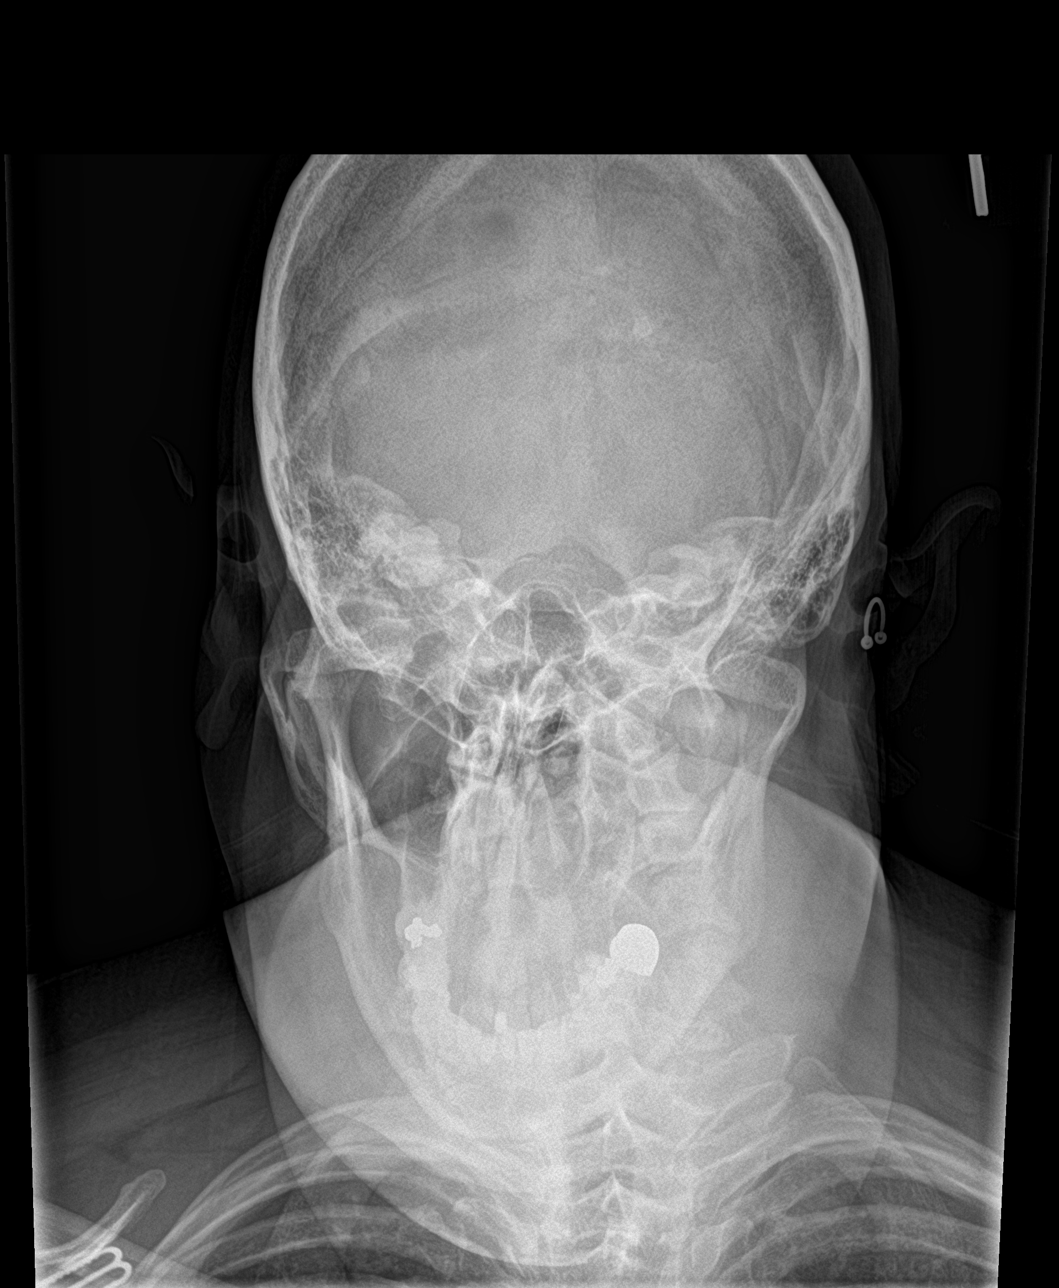

[4 of 4 positions shown; findings below may reference images not displayed]

FINDINGS: There is no evidence of fracture or other significant bone
abnormality. No orbital emphysema or sinus air-fluid levels are
seen.
IMPRESSION: Negative.

## 2018-11-26 IMAGING — DX DG WRIST COMPLETE 3+V*R*
3 series · 3 of 3 positions shown · non-contrast
Comparison: None.

CLINICAL DATA: Fall.  Wrist pain.

EXAM:
RIGHT WRIST - COMPLETE 3+ VIEW

[wrist pa]
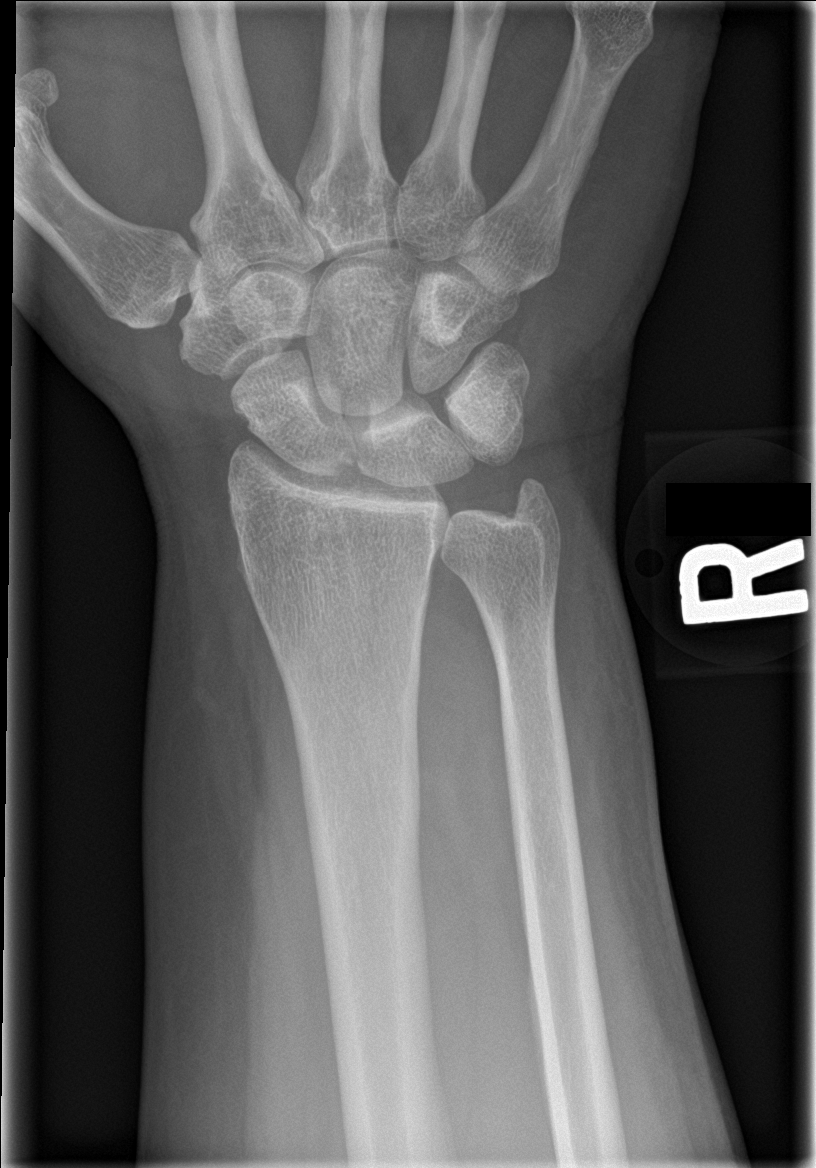

[wrist obl]
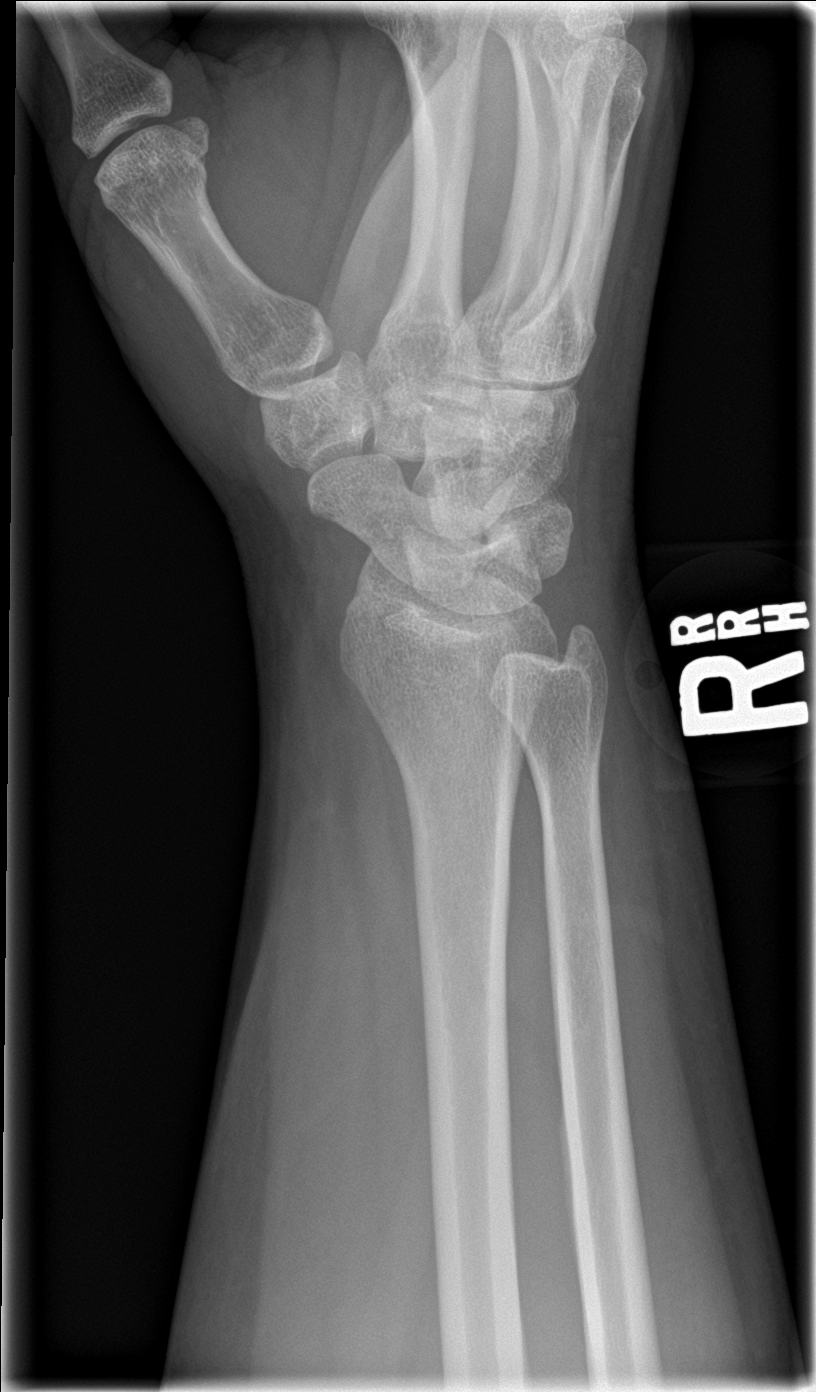

[wrist lat]
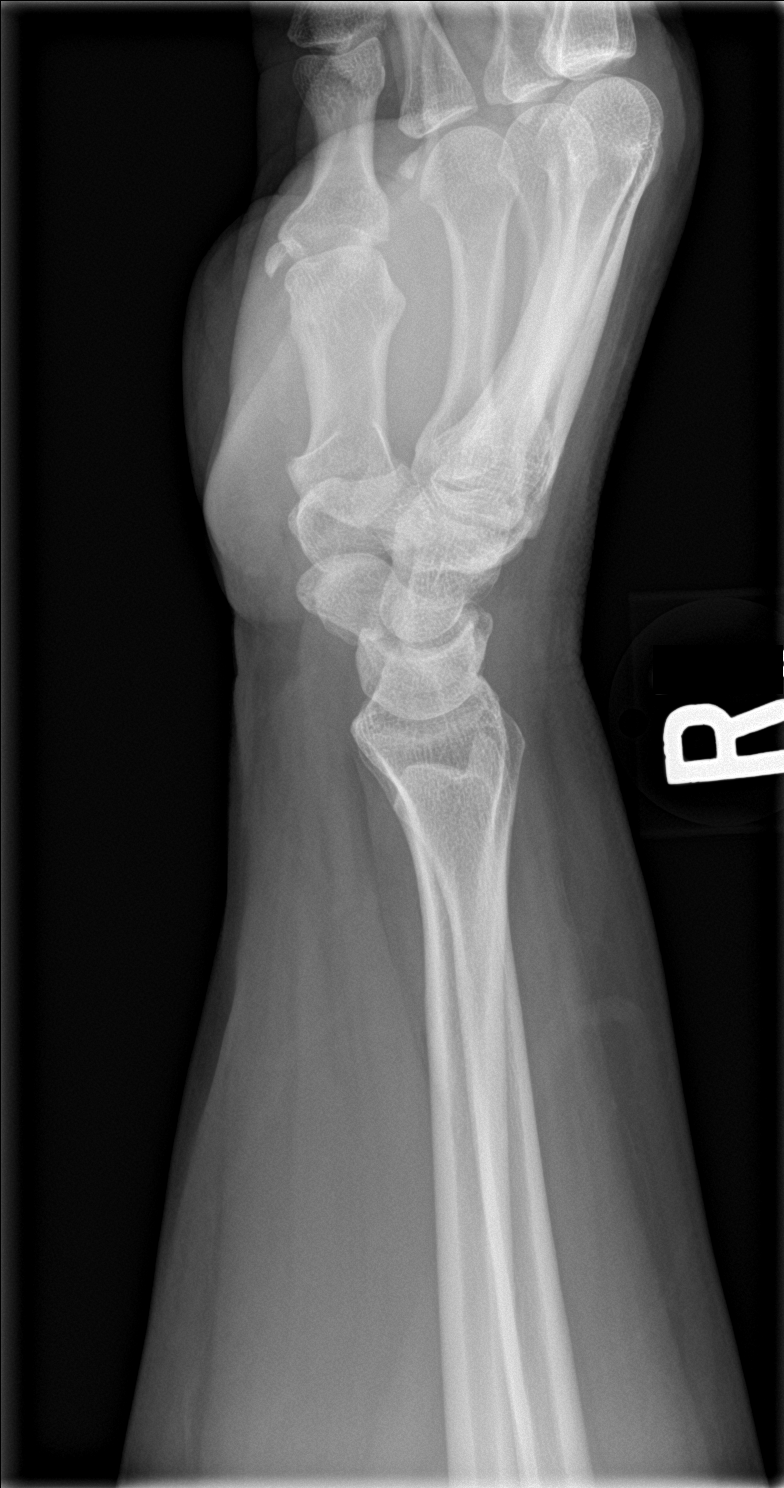

[3 of 3 positions shown; findings below may reference images not displayed]

FINDINGS: There is no evidence of fracture or dislocation. There is no
evidence of arthropathy or other focal bone abnormality. Soft
tissues are unremarkable.
IMPRESSION: Negative.

## 2018-12-11 ENCOUNTER — Other Ambulatory Visit (HOSPITAL_COMMUNITY)
Admission: RE | Admit: 2018-12-11 | Discharge: 2018-12-11 | Disposition: A | Discharge status: Home / Self Care | Payer: BLUE CROSS/BLUE SHIELD | Source: Ambulatory Visit | Attending: Adult Health | Admitting: Adult Health

## 2018-12-11 ENCOUNTER — Encounter: Payer: Self-pay | Admitting: Adult Health

## 2018-12-11 ENCOUNTER — Ambulatory Visit: Payer: BLUE CROSS/BLUE SHIELD | Admitting: Adult Health

## 2018-12-11 VITALS — BP 148/90 | HR 102 | Ht 67.0 in | Wt 325.2 lb

## 2018-12-11 DIAGNOSIS — Z1212 Encounter for screening for malignant neoplasm of rectum: Secondary | ICD-10-CM | POA: Diagnosis not present

## 2018-12-11 DIAGNOSIS — R300 Dysuria: Secondary | ICD-10-CM | POA: Diagnosis not present

## 2018-12-11 DIAGNOSIS — Z01419 Encounter for gynecological examination (general) (routine) without abnormal findings: Secondary | ICD-10-CM | POA: Insufficient documentation

## 2018-12-11 DIAGNOSIS — Z1211 Encounter for screening for malignant neoplasm of colon: Secondary | ICD-10-CM | POA: Diagnosis not present

## 2018-12-11 LAB — POCT URINALYSIS DIPSTICK
Blood, UA: NEGATIVE
Glucose, UA: NEGATIVE
Ketones, UA: NEGATIVE
Leukocytes, UA: NEGATIVE
Nitrite, UA: NEGATIVE
Protein, UA: NEGATIVE

## 2018-12-11 LAB — HEMOCCULT GUIAC POC 1CARD (OFFICE): Fecal Occult Blood, POC: NEGATIVE

## 2018-12-11 NOTE — Progress Notes (Signed)
Patient ID: Sylvia Alvarado, female   DOB: Mar 20, 1971, 48 y.o.   MRN: [id number] History of Present Illness: Sylvia Alvarado is a 48 year old white female, married, in for pap and pelvic.Gets physical with PCP.Had normal pap with +HPV 08/14/17.[facility] is [facility] and trying to work on weight. 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  PCP is Dr Alain Marion.   Current Medications, Allergies, Past Medical History, Past Surgical History, Family History and Social History were reviewed in [employer] record.     Review of Systems: Patient denies any headaches, hearing loss, fatigue, blurred vision, shortness of breath, chest pain, abdominal pain, problems with bowel movements, or intercourse. No mood swings.Depresion is better.  +hot flashes and +body aches +burning with urination for a week or so.    Physical Exam:BP (!) 148/90 (BP Location: Left Wrist, Patient Position: Sitting, Cuff Size: Normal)   Pulse (!) 102   Ht 5\' 7"  (1.702 m)   Wt (!) 325 lb 3.2 oz (147.5 kg)   BMI 50.93 kg/m urine dipstick is negative.  General:  Well developed, well nourished, no acute distress Skin:  Warm and dry Pelvic:  External genitalia is normal in appearance, no lesions.  The vagina is normal in appearance. Urethra has no lesions or masses. The cervix is bulbous.Pap with HPV performed.  Uterus is felt to be normal size, shape, and contour.  No adnexal masses or tenderness noted.Bladder is non tender, no masses felt. Rectal: Good sphincter tone, no polyps, or hemorrhoids felt.  Hemoccult negative.+rectocele Extremities/musculoskeletal:  No swelling or varicosities noted, no clubbing or cyanosis Psych:  No mood changes, alert and cooperative,seems happy Fall risk is low PHQ 2 score 0. Examination chaperoned by Estill Bamberg Rash LPN. Discussed sleeve surgery has option.  Impression: 1. Encounter for gynecological examination with Papanicolaou smear of cervix   2. Dysuria    3. Screening for colorectal cancer       Plan: Pap with HPV sent UA C&S sent Pap in 3 if normal, if still +HPV will get colop Given handout by Tilden Fossa for abnormal pap Physical and labs with PCP Mammogram yearly

## 2018-12-12 LAB — URINALYSIS, ROUTINE W REFLEX MICROSCOPIC
Bilirubin, UA: NEGATIVE
Glucose, UA: NEGATIVE
Ketones, UA: NEGATIVE
Leukocytes, UA: NEGATIVE
NITRITE UA: NEGATIVE
Protein, UA: NEGATIVE
RBC, UA: NEGATIVE
Specific Gravity, UA: 1.027 (ref 1.005–1.030)
Urobilinogen, Ur: 0.2 mg/dL (ref 0.2–1.0)
pH, UA: 5 (ref 5.0–7.5)

## 2018-12-13 LAB — URINE CULTURE

## 2018-12-17 ENCOUNTER — Encounter: Payer: Self-pay | Admitting: Adult Health

## 2018-12-17 ENCOUNTER — Telehealth: Payer: Self-pay | Admitting: Adult Health

## 2018-12-17 DIAGNOSIS — R87618 Other abnormal cytological findings on specimens from cervix uteri: Secondary | ICD-10-CM | POA: Insufficient documentation

## 2018-12-17 DIAGNOSIS — R8789 Other abnormal findings in specimens from female genital organs: Secondary | ICD-10-CM

## 2018-12-17 HISTORY — DX: Other abnormal cytological findings on specimens from cervix uteri: R87.618

## 2018-12-17 LAB — CYTOLOGY - PAP
Adequacy: ABSENT — AB
HPV: DETECTED — AB

## 2018-12-17 NOTE — Telephone Encounter (Signed)
Pt aware that pap is abnormal with +HPV, will make colpo appt

## 2018-12-24 ENCOUNTER — Encounter: Payer: Self-pay | Admitting: Obstetrics & Gynecology

## 2018-12-24 ENCOUNTER — Ambulatory Visit: Payer: BLUE CROSS/BLUE SHIELD | Admitting: Obstetrics & Gynecology

## 2018-12-24 VITALS — BP 150/86 | HR 94 | Ht 67.0 in | Wt 327.4 lb

## 2018-12-24 DIAGNOSIS — R8789 Other abnormal findings in specimens from female genital organs: Secondary | ICD-10-CM | POA: Diagnosis not present

## 2018-12-24 DIAGNOSIS — R87618 Other abnormal cytological findings on specimens from cervix uteri: Secondary | ICD-10-CM

## 2018-12-24 NOTE — Progress Notes (Signed)
Patient ID: Sylvia Alvarado, female   DOB: 10-20-1971, 48 y.o.   MRN: [id number] Colposcopy Procedure Note:  Colposcopy Procedure Note  Indications: Pap smear 1 months ago showed: +HPV. The prior pap showed +HPV.  Prior cervical/vaginal disease: normal exam without visible pathology. Prior cervical treatment: no treatment.  Smoker:  No. New sexual partner:  No.    History of abnormal Pap: yes  Procedure Details  The risks and benefits of the procedure and Written informed consent obtained.  Speculum placed in vagina and excellent visualization of cervix achieved, cervix swabbed x 3 with acetic acid solution.  Findings: Cervix: no visible lesions, no mosaicism, no punctation and no abnormal vasculature; SCJ visualized 360 degrees without lesions and no biopsies taken. Vaginal inspection: vaginal colposcopy normal Vulvar colposcopy: vulvar colposcopy not performed.  Specimens: none  Complications: none.  Plan: Repeat Pap in 1 year

## 2019-01-31 ENCOUNTER — Other Ambulatory Visit: Payer: Self-pay | Admitting: Internal Medicine

## 2019-02-11 ENCOUNTER — Other Ambulatory Visit: Payer: Self-pay | Admitting: Internal Medicine

## 2019-02-27 ENCOUNTER — Encounter: Payer: Self-pay | Admitting: Internal Medicine

## 2019-02-27 ENCOUNTER — Ambulatory Visit (INDEPENDENT_AMBULATORY_CARE_PROVIDER_SITE_OTHER): Payer: Self-pay | Admitting: Internal Medicine

## 2019-02-27 DIAGNOSIS — K760 Fatty (change of) liver, not elsewhere classified: Secondary | ICD-10-CM

## 2019-02-27 DIAGNOSIS — R454 Irritability and anger: Secondary | ICD-10-CM

## 2019-02-27 DIAGNOSIS — F4321 Adjustment disorder with depressed mood: Secondary | ICD-10-CM

## 2019-02-27 MED ORDER — METFORMIN HCL 500 MG PO TABS: ORAL_TABLET | ORAL | 11 refills | Status: DC

## 2019-02-27 MED ORDER — DULOXETINE HCL 30 MG PO CPEP: 30.0000 mg | ORAL_CAPSULE | Freq: Every day | ORAL | 11 refills | Status: DC

## 2019-02-27 MED ORDER — CLONAZEPAM 1 MG PO TABS: 1.0000 mg | ORAL_TABLET | Freq: Two times a day (BID) | ORAL | 5 refills | Status: DC | PRN

## 2019-02-27 MED ORDER — VITAMIN D3 50 MCG (2000 UT) PO CAPS: 2000.0000 [IU] | ORAL_CAPSULE | Freq: Every day | ORAL | 3 refills | Status: AC

## 2019-02-27 NOTE — Progress Notes (Signed)
Virtual Visit via Telephone Note  I connected with Sylvia Alvarado on 02/27/19 at  3:00 PM EDT by telephone and verified that I am speaking with the correct person using two identifiers.   I discussed the limitations, risks, security and privacy concerns of performing an evaluation and management service by telephone and the availability of in person appointments. I also discussed with the patient that there may be a patient responsible charge related to this service. The patient expressed understanding and agreed to proceed.   History of Present Illness:   Went in to follow-up on the round as and depression, anxiety, hypertension Observations/Objective:  Sylvia Alvarado looks well.  She has no acute distress Assessment and Plan: See plan  Follow Up Instructions:    I discussed the assessment and treatment plan with the patient. The patient was provided an opportunity to ask questions and all were answered. The patient agreed with the plan and demonstrated an understanding of the instructions.   The patient was advised to call back or seek an in-person evaluation if the symptoms worsen or if the condition fails to improve as anticipated.  I provided 15 minutes of non-face-to-face time during this encounter.   Walker Kehr, MD

## 2019-02-27 NOTE — Assessment & Plan Note (Signed)
Taking clonazepam 1 mg twice a day as needed  Potential benefits of a long term benzodiazepines  use as well as potential risks  and complications were explained to the patient and were aknowledged.

## 2019-02-27 NOTE — Assessment & Plan Note (Signed)
On metformin

## 2019-02-27 NOTE — Assessment & Plan Note (Signed)
Currently doing well on Cymbalta 30 mg a day

## 2019-05-15 ENCOUNTER — Other Ambulatory Visit: Payer: Self-pay | Admitting: Internal Medicine

## 2019-06-30 ENCOUNTER — Other Ambulatory Visit: Payer: Self-pay | Admitting: *Deleted

## 2019-06-30 MED ORDER — PANTOPRAZOLE SODIUM 40 MG PO TBEC: 40.0000 mg | DELAYED_RELEASE_TABLET | Freq: Two times a day (BID) | ORAL | 3 refills | Status: DC

## 2019-07-30 ENCOUNTER — Other Ambulatory Visit: Payer: Self-pay | Admitting: Internal Medicine

## 2019-11-28 ENCOUNTER — Telehealth: Payer: Self-pay | Admitting: Internal Medicine

## 2019-11-28 NOTE — Telephone Encounter (Signed)
Medication Refill - Medication: clonazePAM (KLONOPIN) 1 MG tablet [id number]    Preferred Pharmacy (with phone number or street name):  [place], [place]  [id number] [place] [id number]  Phone: [phone] Fax: [phone]     Agent: Please be advised that RX refills may take up to 3 business days. We ask that you follow-up with your pharmacy.

## 2019-11-28 NOTE — Telephone Encounter (Signed)
Medication request has already been pended for PCP review

## 2019-11-30 MED ORDER — CLONAZEPAM 1 MG PO TABS: 1.0000 mg | ORAL_TABLET | Freq: Three times a day (TID) | ORAL | 0 refills | Status: DC | PRN

## 2019-12-30 ENCOUNTER — Other Ambulatory Visit: Payer: Self-pay | Admitting: Internal Medicine

## 2020-01-08 ENCOUNTER — Other Ambulatory Visit: Payer: Self-pay | Admitting: Internal Medicine

## 2020-01-08 ENCOUNTER — Other Ambulatory Visit: Payer: Self-pay

## 2020-01-08 ENCOUNTER — Telehealth: Payer: Self-pay

## 2020-01-08 DIAGNOSIS — Z1231 Encounter for screening mammogram for malignant neoplasm of breast: Secondary | ICD-10-CM

## 2020-01-08 NOTE — Telephone Encounter (Signed)
Medication Requested: clonazePAM (KLONOPIN) 1 MG tablet  Is medication on med list: Yes  (if no, inform pt they may need an appointment)  Is medication a controled: No  (yes = last OV with PCP)  -Controlled Substances: Adderall, Ritalin, oxycodone, hydrocodone, methadone, alprazolam, etc  Last visit with PCP: 4.16.20   Is the OV > than 4 months: (yes = schedule an appt if one is not already made)  Pharmacy (Name, Street, [place]): [facility],

## 2020-01-08 NOTE — Telephone Encounter (Signed)
New message    Patient is asking for lab work prior to appt 3.22.2021 for physical

## 2020-01-09 MED ORDER — CLONAZEPAM 1 MG PO TABS: 1.0000 mg | ORAL_TABLET | Freq: Three times a day (TID) | ORAL | 0 refills | Status: DC | PRN

## 2020-01-09 NOTE — Telephone Encounter (Signed)
Please advise about refill in Dr. Plotnikovs absence. 

## 2020-01-09 NOTE — Telephone Encounter (Signed)
Done erx 

## 2020-01-16 ENCOUNTER — Other Ambulatory Visit: Payer: Self-pay | Admitting: Internal Medicine

## 2020-01-16 DIAGNOSIS — I10 Essential (primary) hypertension: Secondary | ICD-10-CM

## 2020-01-16 DIAGNOSIS — R739 Hyperglycemia, unspecified: Secondary | ICD-10-CM

## 2020-01-16 DIAGNOSIS — E538 Deficiency of other specified B group vitamins: Secondary | ICD-10-CM

## 2020-01-16 DIAGNOSIS — Z0000 Encounter for general adult medical examination without abnormal findings: Secondary | ICD-10-CM

## 2020-01-16 DIAGNOSIS — E559 Vitamin D deficiency, unspecified: Secondary | ICD-10-CM

## 2020-01-16 NOTE — Telephone Encounter (Signed)
Lab ordered from [facility]

## 2020-01-22 ENCOUNTER — Encounter: Payer: Self-pay | Admitting: Adult Health

## 2020-01-22 ENCOUNTER — Other Ambulatory Visit (HOSPITAL_COMMUNITY)
Admission: RE | Admit: 2020-01-22 | Discharge: 2020-01-22 | Disposition: A | Discharge status: Home / Self Care | Payer: 59 | Source: Ambulatory Visit | Attending: Adult Health | Admitting: Adult Health

## 2020-01-22 ENCOUNTER — Ambulatory Visit (INDEPENDENT_AMBULATORY_CARE_PROVIDER_SITE_OTHER): Payer: 59 | Admitting: Adult Health

## 2020-01-22 ENCOUNTER — Other Ambulatory Visit: Payer: Self-pay

## 2020-01-22 VITALS — BP 142/92 | HR 70 | Ht 67.0 in | Wt 310.4 lb

## 2020-01-22 DIAGNOSIS — Z8742 Personal history of other diseases of the female genital tract: Secondary | ICD-10-CM | POA: Insufficient documentation

## 2020-01-22 DIAGNOSIS — Z1211 Encounter for screening for malignant neoplasm of colon: Secondary | ICD-10-CM | POA: Insufficient documentation

## 2020-01-22 DIAGNOSIS — Z01419 Encounter for gynecological examination (general) (routine) without abnormal findings: Secondary | ICD-10-CM

## 2020-01-22 DIAGNOSIS — Z6841 Body Mass Index (BMI) 40.0 and over, adult: Secondary | ICD-10-CM

## 2020-01-22 DIAGNOSIS — Z1212 Encounter for screening for malignant neoplasm of rectum: Secondary | ICD-10-CM

## 2020-01-22 DIAGNOSIS — Z79890 Hormone replacement therapy (postmenopausal): Secondary | ICD-10-CM

## 2020-01-22 DIAGNOSIS — R232 Flushing: Secondary | ICD-10-CM | POA: Diagnosis not present

## 2020-01-22 LAB — HEMOCCULT GUIAC POC 1CARD (OFFICE): Fecal Occult Blood, POC: NEGATIVE

## 2020-01-22 MED ORDER — ESTRADIOL 1 MG PO TABS: 1.0000 mg | ORAL_TABLET | Freq: Every day | ORAL | 6 refills | Status: DC

## 2020-01-22 MED ORDER — PROGESTERONE MICRONIZED 200 MG PO CAPS: ORAL_CAPSULE | ORAL | 6 refills | Status: DC

## 2020-01-22 NOTE — Patient Instructions (Signed)
Menopause and Hormone Replacement Therapy Menopause is a normal time of life when menstrual periods stop completely and the ovaries stop producing the female hormones estrogen and progesterone. This lack of hormones can affect your health and cause undesirable symptoms. Hormone replacement therapy (HRT) can relieve some of those symptoms. What is hormone replacement therapy? HRT is the use of artificial (synthetic) hormones to replace hormones that your body has stopped producing because you have reached menopause. What are my options for HRT?  HRT may consist of the synthetic hormones estrogen and progestin, or it may consist of only estrogen (estrogen-only therapy). You and your health care provider will decide which form of HRT is best for you. If you choose to be on HRT and you have a uterus, estrogen and progestin are usually prescribed. Estrogen-only therapy is used for women who do not have a uterus. Possible options for taking HRT include:  Pills.  Patches.  Gels.  Sprays.  Vaginal cream.  Vaginal rings.  Vaginal inserts. The amount of hormone(s) that you take and how long you take the hormone(s) varies according to your health. It is important to:  Begin HRT with the lowest possible dosage.  Stop HRT as soon as your health care provider tells you to stop.  Work with your health care provider so that you feel informed and comfortable with your decisions. What are the benefits of HRT? HRT can reduce the frequency and severity of menopausal symptoms. Benefits of HRT vary according to the kind of symptoms that you have, how severe they are, and your overall health. HRT may help to improve the following symptoms of menopause:  Hot flashes and night sweats. These are sudden feelings of heat that spread over the face and body. The skin may turn red, like a blush. Night sweats are hot flashes that happen while you are sleeping or trying to sleep.  Bone loss (osteoporosis). The  body loses calcium more quickly after menopause, causing the bones to become weaker. This can increase the risk for bone breaks (fractures).  Vaginal dryness. The lining of the vagina can become thin and dry, which can cause pain during sex or cause infection, burning, or itching.  Urinary tract infections.  Urinary incontinence. This is the inability to control when you pass urine.  Irritability.  Short-term memory problems. What are the risks of HRT? Risks of HRT vary depending on your individual health and medical history. Risks of HRT also depend on whether you receive both estrogen and progestin or you receive estrogen only. HRT may increase the risk of:  Spotting. This is when a small amount of blood leaks from the vagina unexpectedly.  Endometrial cancer. This cancer is in the lining of the uterus (endometrium).  Breast cancer.  Increased density of breast tissue. This can make it harder to find breast cancer on a breast X-ray (mammogram).  Stroke.  Heart disease.  Blood clots.  Gallbladder disease.  Liver disease. Risks of HRT can increase if you have any of the following conditions:  Endometrial cancer.  Liver disease.  Heart disease.  Breast cancer.  History of blood clots.  History of stroke. Follow these instructions at home:  Take over-the-counter and prescription medicines only as told by your health care provider.  Get mammograms, pelvic exams, and medical checkups as often as told by your health care provider.  Have Pap tests done as often as told by your health care provider. A Pap test is sometimes called a Pap smear. It   is a screening test that is used to check for signs of cancer of the cervix and vagina. A Pap test can also identify the presence of infection or precancerous changes. Pap tests may be done: ? Every 3 years, starting at age 21. ? Every 5 years, starting after age 30, in combination with testing for human papillomavirus  (HPV). ? More often or less often depending on other medical conditions you have, your age, and other risk factors.  It is up to you to get the results of your Pap test. Ask your health care provider, or the department that is doing the test, when your results will be ready.  Keep all follow-up visits as told by your health care provider. This is important. Contact a health care provider if you have:  Pain or swelling in your legs.  Shortness of breath.  Chest pain.  Lumps or changes in your breasts or armpits.  Slurred speech.  Pain, burning, or bleeding when you urinate.  Unusual vaginal bleeding.  Dizziness or headaches.  Weakness or numbness in any part of your arms or legs.  Pain in your abdomen. Summary  Menopause is a normal time of life when menstrual periods stop completely and the ovaries stop producing the female hormones estrogen and progesterone.  Hormone replacement therapy (HRT) can relieve some of the symptoms of menopause.  HRT can reduce the frequency and severity of menopausal symptoms.  Risks of HRT vary depending on your individual health and medical history. This information is not intended to replace advice given to you by your health care provider. Make sure you discuss any questions you have with your health care provider. Document Revised: 07/02/2018 Document Reviewed: 07/02/2018 Elsevier Patient Education  2020 Elsevier Inc.  

## 2020-01-22 NOTE — Addendum Note (Signed)
Addended by: Karren Cobble on: 01/22/2020 10:48 AM   Modules accepted: Orders

## 2020-01-22 NOTE — Addendum Note (Signed)
Addended by: Zacarias Pontes R on: 01/22/2020 11:00 AM   Modules accepted: Orders

## 2020-01-22 NOTE — Progress Notes (Signed)
Patient ID: Sylvia Alvarado, female   DOB: 1971-05-12, 49 y.o.   MRN: [id number] History of Present Illness:  Sylvia Alvarado is a 49 year old white female,married, G1P1 sp tubal and ablation in for well woman gyn exam and pap.Pap 12/11/18 was LSIL +HPV. PCP is Dr Alain Marion.  Current Medications, Allergies, Past Medical History, Past Surgical History, Family History and Social History were reviewed in [employer] record.     Review of Systems: Patient denies any headaches, hearing loss, fatigue, blurred vision, shortness of breath, chest pain, abdominal pain, problems with bowel movements, urination, or intercourse. No mood swings. Has pain in knees Has hot flashes  Goes to sleep but wakes up Has hair under chin, wax do not shave Having hard time losing weight[facility], has [facility]   Physical Exam:BP (!) 142/92 (BP Location: Left Wrist, Patient Position: Sitting, Cuff Size: Normal)   Pulse 70   Ht 5\' 7"  (1.702 m)   Wt (!) 310 lb 6.4 oz (140.8 kg)   BMI 48.62 kg/m  General:  Well developed, well nourished, no acute distress Skin:  Warm and dry Neck:  Midline trachea, normal thyroid, good ROM, no lymphadenopathy Lungs; Clear to auscultation bilaterally Breast:  No dominant palpable mass, retraction, or nipple discharge,sp breast reduction in 1993 Cardiovascular: Regular rate and rhythm Abdomen:  Soft, non tender, no hepatosplenomegaly,obese Pelvic:  External genitalia is normal in appearance, no lesions.  The vagina is normal in appearance. Urethra has no lesions or masses. The cervix is bulbous.Pap with high risk HPV 16/18 genotyping performed  Uterus is felt to be normal size, shape, and contour.  No adnexal masses or tenderness noted.Bladder is non tender, no masses felt. Rectal: Good sphincter tone, no polyps, or hemorrhoids felt.  Hemoccult negative. Extremities/musculoskeletal:  No swelling or varicosities noted, no clubbing or cyanosis Psych:  No  mood changes, alert and cooperative,seems happy Fall risk is low PHQ 9 score is 3. Examination chaperoned by Diona Fanti CMA.  Impression and Plan: 1. Encounter for gynecological examination with Papanicolaou smear of cervix Pap sent  Physical in 1 year Pap in 3 if normal Mammogram yearly has one in 2 weeks  Labs with PCP  2. History of abnormal cervical Pap smear  3. Screening for colorectal cancer Colonoscopy at 52  4. Hot flashes Discussed HRT, risks and benefits and she wants to try Meds ordered this encounter  Medications  . estradiol (ESTRACE) 1 MG tablet    Sig: Take 1 tablet (1 mg total) by mouth daily.    Dispense:  30 tablet    Refill:  6    Order Specific Question:   Supervising Provider    Answer:   Elonda Husky, LUTHER H [[id number]]  . progesterone (PROMETRIUM) 200 MG capsule    Sig: Take 1 capsule at bedtime    Dispense:  30 capsule    Refill:  6    Order Specific Question:   Supervising Provider    Answer:   Elonda Husky, LUTHER H [[id number]]    5. Morbid obesity with BMI of 45.0-49.9, adult ([place]) Continue working out Try [employer] Consider sleeve surgery   6. Hormone replacement therapy Review handout on HRT  Follow up in 8 weeks

## 2020-01-23 LAB — URINALYSIS
Bilirubin, UA: NEGATIVE
Glucose, UA: NEGATIVE
Ketones, UA: NEGATIVE
Leukocytes,UA: NEGATIVE
Nitrite, UA: NEGATIVE
Protein,UA: NEGATIVE
RBC, UA: NEGATIVE
Specific Gravity, UA: 1.022 (ref 1.005–1.030)
Urobilinogen, Ur: 0.2 mg/dL (ref 0.2–1.0)
pH, UA: 5.5 (ref 5.0–7.5)

## 2020-01-23 LAB — CBC WITH DIFFERENTIAL/PLATELET
Basophils Absolute: 0.1 10*3/uL (ref 0.0–0.2)
Basos: 1 %
EOS (ABSOLUTE): 0.1 10*3/uL (ref 0.0–0.4)
Eos: 1 %
Hematocrit: 45.6 % (ref 34.0–46.6)
Hemoglobin: 15 g/dL (ref 11.1–15.9)
Immature Grans (Abs): 0 10*3/uL (ref 0.0–0.1)
Immature Granulocytes: 0 %
Lymphocytes Absolute: 2.5 10*3/uL (ref 0.7–3.1)
Lymphs: 43 %
MCH: 30.1 pg (ref 26.6–33.0)
MCHC: 32.9 g/dL (ref 31.5–35.7)
MCV: 92 fL (ref 79–97)
Monocytes Absolute: 0.4 10*3/uL (ref 0.1–0.9)
Monocytes: 7 %
Neutrophils Absolute: 2.7 10*3/uL (ref 1.4–7.0)
Neutrophils: 48 %
Platelets: 217 10*3/uL (ref 150–450)
RBC: 4.98 x10E6/uL (ref 3.77–5.28)
RDW: 13.1 % (ref 11.7–15.4)
WBC: 5.7 10*3/uL (ref 3.4–10.8)

## 2020-01-23 LAB — MICROALBUMIN / CREATININE URINE RATIO
Creatinine, Urine: 144.6 mg/dL
Microalb/Creat Ratio: 5 mg/g creat (ref 0–29)
Microalbumin, Urine: 7.2 ug/mL

## 2020-01-23 LAB — BASIC METABOLIC PANEL
BUN/Creatinine Ratio: 13 (ref 9–23)
BUN: 11 mg/dL (ref 6–24)
CO2: 23 mmol/L (ref 20–29)
Calcium: 10.1 mg/dL (ref 8.7–10.2)
Chloride: 99 mmol/L (ref 96–106)
Creatinine, Ser: 0.83 mg/dL (ref 0.57–1.00)
GFR calc Af Amer: 98 mL/min/{1.73_m2} (ref >59)
GFR calc non Af Amer: 85 mL/min/{1.73_m2} (ref >59)
Glucose: 121 mg/dL — ABNORMAL HIGH (ref 65–99)
Potassium: 4 mmol/L (ref 3.5–5.2)
Sodium: 136 mmol/L (ref 134–144)

## 2020-01-23 LAB — HEPATIC FUNCTION PANEL
ALT: 59 IU/L — ABNORMAL HIGH (ref 0–32)
AST: 67 IU/L — ABNORMAL HIGH (ref 0–40)
Albumin: 4.4 g/dL (ref 3.8–4.8)
Alkaline Phosphatase: 66 IU/L (ref 39–117)
Bilirubin Total: 0.5 mg/dL (ref 0.0–1.2)
Bilirubin, Direct: 0.14 mg/dL (ref 0.00–0.40)
Total Protein: 7.3 g/dL (ref 6.0–8.5)

## 2020-01-23 LAB — LIPID PANEL
Chol/HDL Ratio: 3.9 ratio (ref 0.0–4.4)
Cholesterol, Total: 208 mg/dL — ABNORMAL HIGH (ref 100–199)
HDL: 54 mg/dL (ref >39)
LDL Chol Calc (NIH): 120 mg/dL — ABNORMAL HIGH (ref 0–99)
Triglycerides: 194 mg/dL — ABNORMAL HIGH (ref 0–149)
VLDL Cholesterol Cal: 34 mg/dL (ref 5–40)

## 2020-01-23 LAB — HEMOGLOBIN A1C
Est. average glucose Bld gHb Est-mCnc: 134 mg/dL
Hgb A1c MFr Bld: 6.3 % — ABNORMAL HIGH (ref 4.8–5.6)

## 2020-01-23 LAB — VITAMIN B12: Vitamin B-12: 1714 pg/mL — ABNORMAL HIGH (ref 232–1245)

## 2020-01-23 LAB — TSH: TSH: 1.43 u[IU]/mL (ref 0.450–4.500)

## 2020-01-23 LAB — VITAMIN D 25 HYDROXY (VIT D DEFICIENCY, FRACTURES): Vit D, 25-Hydroxy: 56.9 ng/mL (ref 30.0–100.0)

## 2020-01-27 LAB — CYTOLOGY - PAP
Comment: NEGATIVE
Diagnosis: UNDETERMINED — AB
High risk HPV: NEGATIVE

## 2020-02-02 ENCOUNTER — Other Ambulatory Visit: Payer: Self-pay

## 2020-02-02 ENCOUNTER — Encounter: Payer: Self-pay | Admitting: Internal Medicine

## 2020-02-02 ENCOUNTER — Ambulatory Visit (INDEPENDENT_AMBULATORY_CARE_PROVIDER_SITE_OTHER): Payer: 59 | Admitting: Internal Medicine

## 2020-02-02 ENCOUNTER — Ambulatory Visit
Admission: RE | Admit: 2020-02-02 | Discharge: 2020-02-02 | Disposition: A | Discharge status: Home / Self Care | Payer: 59 | Source: Ambulatory Visit | Attending: Internal Medicine | Admitting: Internal Medicine

## 2020-02-02 VITALS — BP 132/82 | HR 93 | Temp 97.8°F | Ht 67.0 in | Wt 310.0 lb

## 2020-02-02 DIAGNOSIS — Z0000 Encounter for general adult medical examination without abnormal findings: Secondary | ICD-10-CM

## 2020-02-02 DIAGNOSIS — F4321 Adjustment disorder with depressed mood: Secondary | ICD-10-CM

## 2020-02-02 DIAGNOSIS — Z23 Encounter for immunization: Secondary | ICD-10-CM | POA: Diagnosis not present

## 2020-02-02 DIAGNOSIS — K760 Fatty (change of) liver, not elsewhere classified: Secondary | ICD-10-CM | POA: Diagnosis not present

## 2020-02-02 DIAGNOSIS — Z1231 Encounter for screening mammogram for malignant neoplasm of breast: Secondary | ICD-10-CM

## 2020-02-02 DIAGNOSIS — R7309 Other abnormal glucose: Secondary | ICD-10-CM

## 2020-02-02 DIAGNOSIS — Z6841 Body Mass Index (BMI) 40.0 and over, adult: Secondary | ICD-10-CM

## 2020-02-02 DIAGNOSIS — R739 Hyperglycemia, unspecified: Secondary | ICD-10-CM

## 2020-02-02 NOTE — Assessment & Plan Note (Signed)
Mikaela has been happy. Doing well.

## 2020-02-02 NOTE — Progress Notes (Signed)
Subjective:  Patient ID: Sylvia Alvarado, female    DOB: 09/22/1971  Age: 49 y.o. MRN: [id number]  CC: No chief complaint on file.   HPI [name] presents for a well exam [name] has been happy. Doing well.  Outpatient Medications Prior to Visit  Medication Sig Dispense Refill  . albuterol (PROVENTIL HFA;VENTOLIN HFA) 108 (90 Base) MCG/ACT inhaler Inhale 2 puffs into the lungs every 6 (six) hours as needed for wheezing or shortness of breath. 18 g 3  . budesonide-formoterol (SYMBICORT) 160-4.5 MCG/ACT inhaler Inhale 2 puffs into the lungs 2 (two) times daily. 1 Inhaler 6  . Cholecalciferol (VITAMIN D3) 50 MCG (2000 UT) capsule Take 1 capsule (2,000 Units total) by mouth daily. 100 capsule 3  . clonazePAM (KLONOPIN) 1 MG tablet Take 1 tablet (1 mg total) by mouth 3 (three) times daily as needed. Must keep upcoming appointment to get refills 60 tablet 0  . Cyanocobalamin (VITAMIN B-12) 500 MCG SUBL 1 sl qd (Patient taking differently: Place 500 mcg under the tongue daily. ) 100 tablet 3  . cyclobenzaprine (FLEXERIL) 10 MG tablet TAKE (1) TABLET BY MOUTH TWICE DAILY. 60 tablet 2  . diclofenac sodium (VOLTAREN) 1 % GEL APPLY 2-4 GRAMS TO AFFECTED AREA TWICE AS DIRECTED AS NEEDED. 200 g 0  . DULoxetine (CYMBALTA) 30 MG capsule Take 1 capsule (30 mg total) by mouth daily. 30 capsule 11  . estradiol (ESTRACE) 1 MG tablet Take 1 tablet (1 mg total) by mouth daily. 30 tablet 6  . furosemide (LASIX) 20 MG tablet 1po prn swelling 30 tablet 3  . metFORMIN (GLUCOPHAGE) 500 MG tablet TAKE ONE TABLET BY MOUTH ONCE DAILY WITH BREAKFAST. 30 tablet 11  . pantoprazole (PROTONIX) 40 MG tablet Take 1 tablet (40 mg total) by mouth 2 (two) times daily. 60 tablet 3  . progesterone (PROMETRIUM) 200 MG capsule Take 1 capsule at bedtime 30 capsule 6   No facility-administered medications prior to visit.    ROS: Review of Systems  Constitutional: Negative for activity change, appetite change, chills,  fatigue and unexpected weight change.  HENT: Negative for congestion, mouth sores and sinus pressure.   Eyes: Negative for visual disturbance.  Respiratory: Negative for cough and chest tightness.   Gastrointestinal: Negative for abdominal pain and nausea.  Genitourinary: Negative for difficulty urinating, frequency and vaginal pain.  Musculoskeletal: Negative for back pain and gait problem.  Skin: Negative for pallor and rash.  Neurological: Negative for dizziness, tremors, weakness, numbness and headaches.  Psychiatric/Behavioral: Negative for confusion, sleep disturbance and suicidal ideas.    Objective:  BP 132/82 (BP Location: Left Arm, Patient Position: Sitting, Cuff Size: Large)   Pulse 93   Temp 97.8 F (36.6 C) (Oral)   Ht 5\' 7"  (1.702 m)   Wt (!) 310 lb (140.6 kg)   SpO2 98%   BMI 48.55 kg/m   BP Readings from Last 3 Encounters:  02/02/20 132/82  01/22/20 (!) 142/92  12/24/18 (!) 150/86    Wt Readings from Last 3 Encounters:  02/02/20 (!) 310 lb (140.6 kg)  01/22/20 (!) 310 lb 6.4 oz (140.8 kg)  12/24/18 (!) 327 lb 6.4 oz (148.5 kg)    Physical Exam Constitutional:      General: She is not in acute distress.    Appearance: She is well-developed. She is obese.  HENT:     Head: Normocephalic.     Right Ear: External ear normal.     Left Ear: External ear  normal.     Nose: Nose normal.  Eyes:     General:        Right eye: No discharge.        Left eye: No discharge.     Conjunctiva/sclera: Conjunctivae normal.     Pupils: Pupils are equal, round, and reactive to light.  Neck:     Thyroid: No thyromegaly.     Vascular: No JVD.     Trachea: No tracheal deviation.  Cardiovascular:     Rate and Rhythm: Normal rate and regular rhythm.     Heart sounds: Normal heart sounds.  Pulmonary:     Effort: No respiratory distress.     Breath sounds: No stridor. No wheezing.  Abdominal:     General: Bowel sounds are normal. There is no distension.     Palpations:  Abdomen is soft. There is no mass.     Tenderness: There is no abdominal tenderness. There is no guarding or rebound.  Musculoskeletal:        General: No tenderness.     Cervical back: Normal range of motion and neck supple.  Lymphadenopathy:     Cervical: No cervical adenopathy.  Skin:    Findings: No erythema or rash.  Neurological:     Cranial Nerves: No cranial nerve deficit.     Motor: No abnormal muscle tone.     Coordination: Coordination normal.     Deep Tendon Reflexes: Reflexes normal.  Psychiatric:        Behavior: Behavior normal.        Thought Content: Thought content normal.        Judgment: Judgment normal.     Lab Results  Component Value Date   WBC 5.7 01/22/2020   HGB 15.0 01/22/2020   HCT 45.6 01/22/2020   PLT 217 01/22/2020   GLUCOSE 121 (H) 01/22/2020   CHOL 208 (H) 01/22/2020   TRIG 194 (H) 01/22/2020   HDL 54 01/22/2020   LDLDIRECT 135.0 03/18/2018   LDLCALC 120 (H) 01/22/2020   ALT 59 (H) 01/22/2020   AST 67 (H) 01/22/2020   NA 136 01/22/2020   K 4.0 01/22/2020   CL 99 01/22/2020   CREATININE 0.83 01/22/2020   BUN 11 01/22/2020   CO2 23 01/22/2020   TSH 1.430 01/22/2020   HGBA1C 6.3 (H) 01/22/2020    No results found.  Assessment & Plan:   Walker Kehr, MD

## 2020-02-02 NOTE — Patient Instructions (Addendum)
FEMA mass vaccine site in [place]:  Call the [place] at [phone] to schedule your shot at [facility].   [place] federal vaccine clinic is preparing to open on Wednesday 01/21/20. The FEMA site at [place] has the capacity to vaccinate 3,000 people a day for eight weeks. That's nearly 170,000 doses just from this one clinic.   With such a big operation, here are answers to some questions you may have about the drive-thru and indoor site:  How do I make an appointment?   Head to [email or web address] to schedule your appointment indoors or in the drive-thru, or call the [place] at [phone].  What if I need to change or cancel my appointment, or have more questions?   Call the [place] at [phone].  What vaccines will be available at the clinic?   The vaccine clinic will begin giving both Pfizer and Moderna two-dose COVID-19 vaccines. The single-dose [employer] vaccines will be given during the last two weeks of the clinic.   What part of the [place] do I enter for my appointment?  Enter from [employer] and turn onto [address]. A clinic staff member will confirm your appointment for the day. You'll then either be directed to registration or to a waiting area until your appointment time.  Can I be seen sooner if I'm early?  Those who are early will park in a designated waiting area until their vaccine appointment time approaches.  How does registration work?  There are five open lanes for registration. Someone will get the necessary information needed to confirm appointments and other details to keep a record of who's getting the vaccine every day. Temperatures will be checked to make sure you're in good shape to get the vaccine.  How long will it take to get my shot?  [place] park your car in a long tent with about 10  other vehicles. All 10 people in that group will get a shot inside their vehicles. Getting the actual shot only takes a few minutes. The entire process takes about 30 minutes.  How does the observation period work?  All patients will wait in the same tent they got their vaccine at for 15 minutes.      These suggestions will probably help you to improve your metabolism if you are not overweight and to lose weight if you are overweight: 1.  Reduce your consumption of sugars and starches.  Eliminate high fructose corn syrup from your diet.  Reduce your consumption of processed foods.  For desserts try to have seasonal fruits, berries, nuts, cheeses or dark chocolate with more than 70% cacao. 2.  Do not snack 3.  You do not have to eat breakfast.  If you choose to have breakfast-eat plain greek yogurt, eggs, oatmeal (without sugar) 4.  Drink water, freshly brewed unsweetened tea (green, black or herbal) or coffee.  Do not drink sodas including diet sodas , juices, beverages sweetened with artificial sweeteners. 5.  Reduce your consumption of refined grains. 6.  Avoid protein drinks such as Optifast, Slim fast etc. Eat chicken, fish, meat, dairy and beans for your sources of protein 7.  Natural unprocessed fats like cold pressed virgin olive oil, butter, coconut oil are good for you.  Eat avocados 8.  Increase your consumption of fiber.  Fruits, berries, vegetables, whole grains, flaxseeds, Chia seeds, beans, popcorn, nuts, oatmeal are  good sources of fiber 9.  Use vinegar in your diet, i.e. apple cider vinegar, red wine or balsamic vinegar 10.  You can try fasting.  For example you can skip breakfast and lunch every other day (24-hour fast) 11.  Stress reduction, good night sleep, relaxation, meditation, yoga and other physical activity is likely to help you to maintain low weight too. 12.  If you drink alcohol, limit your alcohol intake to no more than 2 drinks a day.

## 2020-02-02 NOTE — Assessment & Plan Note (Signed)
Sylvia Alvarado has been working out. On diet RTC 6 mo w/labs

## 2020-02-02 NOTE — Assessment & Plan Note (Signed)
  We discussed age appropriate health related issues, including available/recomended screening tests and vaccinations. Labs were ordered to be later reviewed . All questions were answered. We discussed one or more of the following - seat belt use, use of sunscreen/sun exposure exercise, safe sex, fall risk reduction, second hand smoke exposure, firearm use and storage, seat belt use, a need for adhering to healthy diet and exercise. Labs were discussed. All questions were answered.

## 2020-02-03 ENCOUNTER — Other Ambulatory Visit: Payer: Self-pay

## 2020-02-03 MED ORDER — DICLOFENAC SODIUM 1 % TD GEL: TRANSDERMAL | 0 refills | Status: DC

## 2020-02-10 ENCOUNTER — Ambulatory Visit: Payer: Self-pay

## 2020-02-11 ENCOUNTER — Other Ambulatory Visit: Payer: Self-pay | Admitting: Internal Medicine

## 2020-02-17 ENCOUNTER — Other Ambulatory Visit: Payer: Self-pay | Admitting: Internal Medicine

## 2020-02-18 NOTE — Telephone Encounter (Signed)
Will need ok per PCP - will forward

## 2020-02-26 ENCOUNTER — Other Ambulatory Visit: Payer: Self-pay | Admitting: Internal Medicine

## 2020-02-28 MED ORDER — CLONAZEPAM 1 MG PO TABS: 1.0000 mg | ORAL_TABLET | Freq: Three times a day (TID) | ORAL | 1 refills | Status: DC | PRN

## 2020-03-08 ENCOUNTER — Other Ambulatory Visit: Payer: Self-pay | Admitting: Internal Medicine

## 2020-03-10 ENCOUNTER — Other Ambulatory Visit: Payer: Self-pay | Admitting: Internal Medicine

## 2020-03-10 MED ORDER — FLUTICASONE PROPIONATE 50 MCG/ACT NA SUSP
2.0000 | Freq: Every day | NASAL | 6 refills | Status: DC
Start: 2020-03-10 — End: 2021-04-08

## 2020-03-17 ENCOUNTER — Other Ambulatory Visit: Payer: Self-pay | Admitting: Internal Medicine

## 2020-03-18 ENCOUNTER — Ambulatory Visit: Payer: 59 | Admitting: Adult Health

## 2020-03-27 ENCOUNTER — Other Ambulatory Visit: Payer: Self-pay | Admitting: Internal Medicine

## 2020-04-06 ENCOUNTER — Other Ambulatory Visit: Payer: Self-pay | Admitting: Internal Medicine

## 2020-04-27 ENCOUNTER — Other Ambulatory Visit: Payer: Self-pay | Admitting: Adult Health

## 2020-04-27 MED ORDER — ESTRADIOL 2 MG PO TABS
2.0000 mg | ORAL_TABLET | Freq: Every day | ORAL | 3 refills | Status: DC
Start: 2020-04-27 — End: 2021-06-15

## 2020-04-27 NOTE — Progress Notes (Signed)
Refilled estrace 2 mg

## 2020-04-30 ENCOUNTER — Other Ambulatory Visit: Payer: Self-pay | Admitting: Adult Health

## 2020-05-20 ENCOUNTER — Other Ambulatory Visit: Payer: Self-pay | Admitting: Internal Medicine

## 2020-06-09 ENCOUNTER — Other Ambulatory Visit: Payer: Self-pay | Admitting: Internal Medicine

## 2020-06-11 NOTE — Telephone Encounter (Signed)
[place] Controlled Database Checked Last filled: 05/10/2020 (60) LOV w/you: 02/02/2020 Next appt w/you: 08/04/2020

## 2020-07-29 ENCOUNTER — Other Ambulatory Visit: Payer: Self-pay | Admitting: Internal Medicine

## 2020-07-29 NOTE — Telephone Encounter (Signed)
   Patient calling for status of lab order

## 2020-07-30 NOTE — Telephone Encounter (Signed)
   Please fax lab order to [phone]

## 2020-08-01 ENCOUNTER — Other Ambulatory Visit: Payer: Self-pay | Admitting: Adult Health

## 2020-08-02 NOTE — Telephone Encounter (Signed)
Order has been faxed

## 2020-08-03 ENCOUNTER — Other Ambulatory Visit: Payer: Self-pay | Admitting: Internal Medicine

## 2020-08-04 ENCOUNTER — Encounter: Payer: Self-pay | Admitting: Internal Medicine

## 2020-08-04 ENCOUNTER — Other Ambulatory Visit: Payer: Self-pay

## 2020-08-04 ENCOUNTER — Ambulatory Visit (INDEPENDENT_AMBULATORY_CARE_PROVIDER_SITE_OTHER): Payer: 59 | Admitting: Internal Medicine

## 2020-08-04 ENCOUNTER — Ambulatory Visit: Payer: 59 | Admitting: Internal Medicine

## 2020-08-04 VITALS — BP 150/90 | HR 89 | Temp 98.3°F | Ht 67.0 in | Wt 322.0 lb

## 2020-08-04 DIAGNOSIS — I10 Essential (primary) hypertension: Secondary | ICD-10-CM | POA: Diagnosis not present

## 2020-08-04 DIAGNOSIS — R002 Palpitations: Secondary | ICD-10-CM | POA: Insufficient documentation

## 2020-08-04 DIAGNOSIS — Z8249 Family history of ischemic heart disease and other diseases of the circulatory system: Secondary | ICD-10-CM

## 2020-08-04 DIAGNOSIS — F4321 Adjustment disorder with depressed mood: Secondary | ICD-10-CM

## 2020-08-04 DIAGNOSIS — Z6841 Body Mass Index (BMI) 40.0 and over, adult: Secondary | ICD-10-CM

## 2020-08-04 DIAGNOSIS — Z23 Encounter for immunization: Secondary | ICD-10-CM

## 2020-08-04 DIAGNOSIS — G8929 Other chronic pain: Secondary | ICD-10-CM

## 2020-08-04 DIAGNOSIS — M5440 Lumbago with sciatica, unspecified side: Secondary | ICD-10-CM

## 2020-08-04 LAB — COMPREHENSIVE METABOLIC PANEL
ALT: 45 IU/L — ABNORMAL HIGH (ref 0–32)
AST: 59 IU/L — ABNORMAL HIGH (ref 0–40)
Albumin/Globulin Ratio: 1.6 (ref 1.2–2.2)
Albumin: 4.5 g/dL (ref 3.8–4.8)
Alkaline Phosphatase: 63 IU/L (ref 44–121)
BUN/Creatinine Ratio: 19 (ref 9–23)
BUN: 15 mg/dL (ref 6–24)
Bilirubin Total: 0.3 mg/dL (ref 0.0–1.2)
CO2: 24 mmol/L (ref 20–29)
Calcium: 9.8 mg/dL (ref 8.7–10.2)
Chloride: 101 mmol/L (ref 96–106)
Creatinine, Ser: 0.8 mg/dL (ref 0.57–1.00)
GFR calc Af Amer: 102 mL/min/{1.73_m2} (ref >59)
GFR calc non Af Amer: 88 mL/min/{1.73_m2} (ref >59)
Globulin, Total: 2.8 g/dL (ref 1.5–4.5)
Glucose: 146 mg/dL — ABNORMAL HIGH (ref 65–99)
Potassium: 4.4 mmol/L (ref 3.5–5.2)
Sodium: 137 mmol/L (ref 134–144)
Total Protein: 7.3 g/dL (ref 6.0–8.5)

## 2020-08-04 LAB — HGB A1C W/O EAG: Hgb A1c MFr Bld: 6.5 % — ABNORMAL HIGH (ref 4.8–5.6)

## 2020-08-04 MED ORDER — METOPROLOL SUCCINATE ER 25 MG PO TB24: 25.0000 mg | ORAL_TABLET | Freq: Every day | ORAL | 3 refills | Status: DC

## 2020-08-04 MED ORDER — METRONIDAZOLE 0.75 % EX LOTN: TOPICAL_LOTION | CUTANEOUS | 3 refills | Status: DC

## 2020-08-04 NOTE — Assessment & Plan Note (Signed)
Worse Diet discussed 

## 2020-08-04 NOTE — Assessment & Plan Note (Addendum)
A cardiac CT scan for calcium scoring offered and ordered

## 2020-08-04 NOTE — Progress Notes (Signed)
Subjective:  Patient ID: Sylvia Alvarado, female    DOB: 1971/09/30  Age: 49 y.o. MRN: [id number]  CC: No chief complaint on file.   HPI Sylvia Alvarado presents for anxiety, rapid HR 120-130 at times C/o stress at home, at work Sister died of ?COVID complications    Outpatient Medications Prior to Visit  Medication Sig Dispense Refill  . albuterol (PROVENTIL HFA;VENTOLIN HFA) 108 (90 Base) MCG/ACT inhaler Inhale 2 puffs into the lungs every 6 (six) hours as needed for wheezing or shortness of breath. 18 g 3  . budesonide-formoterol (SYMBICORT) 160-4.5 MCG/ACT inhaler Inhale 2 puffs into the lungs 2 (two) times daily. 1 Inhaler 6  . Cholecalciferol (VITAMIN D3) 50 MCG (2000 UT) capsule Take 1 capsule (2,000 Units total) by mouth daily. 100 capsule 3  . clonazePAM (KLONOPIN) 1 MG tablet TAKE 1 TABLET BY MOUTH THREE TIMES DAILY AS NEEDED 60 tablet 1  . Cyanocobalamin (VITAMIN B-12) 500 MCG SUBL 1 sl qd (Patient taking differently: Place 500 mcg under the tongue daily. ) 100 tablet 3  . cyclobenzaprine (FLEXERIL) 10 MG tablet TAKE 1 TABLET BY MOUTH TWICE DAILY 60 tablet 1  . diclofenac sodium (VOLTAREN) 1 % GEL APPLY 2-4 GRAMS TO AFFECTED AREA TWICE AS DIRECTED AS NEEDED. 200 g 0  . DULoxetine (CYMBALTA) 30 MG capsule TAKE ONE CAPSULE BY MOUTH DAILY 30 capsule 11  . estradiol (ESTRACE) 2 MG tablet Take 1 tablet (2 mg total) by mouth daily. 90 tablet 3  . fluticasone (FLONASE) 50 MCG/ACT nasal spray Place 2 sprays into both nostrils daily. 16 g 6  . furosemide (LASIX) 20 MG tablet 1po prn swelling 30 tablet 3  . metFORMIN (GLUCOPHAGE) 500 MG tablet TAKE 1 TABLET BY MOUTH ONCE DAILY WITH BREAKFAST 30 tablet 11  . pantoprazole (PROTONIX) 40 MG tablet TAKE 1 TABLET BY MOUTH TWICE DAILY 180 tablet 3  . progesterone (PROMETRIUM) 200 MG capsule TAKE ONE CAPSULE BY MOUTH AT BEDTIME 30 capsule 6   No facility-administered medications prior to visit.    ROS: Review of Systems  Constitutional:  Positive for unexpected weight change. Negative for activity change, appetite change, chills and fatigue.  HENT: Negative for congestion, mouth sores and sinus pressure.   Eyes: Negative for visual disturbance.  Respiratory: Negative for cough and chest tightness.   Cardiovascular: Positive for palpitations.  Gastrointestinal: Negative for abdominal pain and nausea.  Genitourinary: Negative for difficulty urinating, frequency and vaginal pain.  Musculoskeletal: Negative for back pain and gait problem.  Skin: Negative for pallor and rash.  Neurological: Negative for dizziness, tremors, weakness, numbness and headaches.  Psychiatric/Behavioral: Negative for confusion and sleep disturbance.    Objective:  BP (!) 150/90 (BP Location: Left Arm, Patient Position: Sitting, Cuff Size: Large)   Pulse 89   Temp 98.3 F (36.8 C) (Oral)   Ht 5\' 7"  (1.702 m)   Wt (!) 322 lb (146.1 kg)   SpO2 97%   BMI 50.43 kg/m   BP Readings from Last 3 Encounters:  08/04/20 (!) 150/90  02/02/20 132/82  01/22/20 (!) 142/92    Wt Readings from Last 3 Encounters:  08/04/20 (!) 322 lb (146.1 kg)  02/02/20 (!) 310 lb (140.6 kg)  01/22/20 (!) 310 lb 6.4 oz (140.8 kg)    Physical Exam Constitutional:      General: She is not in acute distress.    Appearance: She is well-developed. She is obese.  HENT:     Head: Normocephalic.  Right Ear: External ear normal.     Left Ear: External ear normal.     Nose: Nose normal.  Eyes:     General:        Right eye: No discharge.        Left eye: No discharge.     Conjunctiva/sclera: Conjunctivae normal.     Pupils: Pupils are equal, round, and reactive to light.  Neck:     Thyroid: No thyromegaly.     Vascular: No JVD.     Trachea: No tracheal deviation.  Cardiovascular:     Rate and Rhythm: Normal rate and regular rhythm.     Heart sounds: Normal heart sounds.  Pulmonary:     Effort: No respiratory distress.     Breath sounds: No stridor. No  wheezing.  Abdominal:     General: Bowel sounds are normal. There is no distension.     Palpations: Abdomen is soft. There is no mass.     Tenderness: There is no abdominal tenderness. There is no guarding or rebound.  Musculoskeletal:        General: No tenderness.     Cervical back: Normal range of motion and neck supple.  Lymphadenopathy:     Cervical: No cervical adenopathy.  Skin:    Findings: No erythema or rash.  Neurological:     Mental Status: She is oriented to person, place, and time.     Cranial Nerves: No cranial nerve deficit.     Motor: No abnormal muscle tone.     Coordination: Coordination normal.     Deep Tendon Reflexes: Reflexes normal.  Psychiatric:        Behavior: Behavior normal.        Thought Content: Thought content normal.        Judgment: Judgment normal.    Rosacea   Lab Results  Component Value Date   WBC 5.7 01/22/2020   HGB 15.0 01/22/2020   HCT 45.6 01/22/2020   PLT 217 01/22/2020   GLUCOSE 121 (H) 01/22/2020   CHOL 208 (H) 01/22/2020   TRIG 194 (H) 01/22/2020   HDL 54 01/22/2020   LDLDIRECT 135.0 03/18/2018   LDLCALC 120 (H) 01/22/2020   ALT 59 (H) 01/22/2020   AST 67 (H) 01/22/2020   NA 136 01/22/2020   K 4.0 01/22/2020   CL 99 01/22/2020   CREATININE 0.83 01/22/2020   BUN 11 01/22/2020   CO2 23 01/22/2020   TSH 1.430 01/22/2020   HGBA1C 6.3 (H) 01/22/2020    MM 3D SCREEN BREAST BILATERAL  Result Date: 02/03/2020 CLINICAL DATA:  Screening. EXAM: DIGITAL SCREENING BILATERAL MAMMOGRAM WITH TOMO AND CAD COMPARISON:  Previous exam(s). ACR Breast Density Category a: The breast tissue is almost entirely fatty. FINDINGS: There are no findings suspicious for malignancy. Images were processed with CAD. IMPRESSION: No mammographic evidence of malignancy. A result letter of this screening mammogram will be mailed directly to the patient. RECOMMENDATION: Screening mammogram in one year. (Code:SM-B-01Y) BI-RADS CATEGORY  1: Negative.  Electronically Signed   By: Everlean Alstrom M.D.   On: 02/03/2020 09:11    Assessment & Plan:    Walker Kehr, MD

## 2020-08-04 NOTE — Assessment & Plan Note (Signed)
Worse Added Toprol XL

## 2020-08-04 NOTE — Assessment & Plan Note (Signed)
Cymbalta  Potential benefits of a long term benzodiazepines  use as well as potential risks  and complications were explained to the patient and were aknowledged. Gabapentin

## 2020-08-04 NOTE — Assessment & Plan Note (Signed)
probable SVT

## 2020-08-04 NOTE — Patient Instructions (Signed)
These suggestions will probably help you to improve your metabolism if you are not overweight and to lose weight if you are overweight: 1.  Reduce your consumption of sugars and starches.  Eliminate high fructose corn syrup from your diet.  Reduce your consumption of processed foods.  For desserts try to have seasonal fruits, berries, nuts, cheeses or dark chocolate with more than 70% cacao. 2.  Do not snack 3.  You do not have to eat breakfast.  If you choose to have breakfast - eat plain greek yogurt, eggs, oatmeal (without sugar) - use honey if you need to. 4.  Drink water, freshly brewed unsweetened tea (green, black or herbal) or coffee.  Do not drink sodas including diet sodas , juices, beverages sweetened with artificial sweeteners. 5.  Reduce your consumption of refined grains. 6.  Avoid protein drinks such as Optifast, Slim fast etc. Eat chicken, fish, meat, dairy and beans for your sources of protein. 7.  Natural unprocessed fats like cold pressed virgin olive oil, butter, coconut oil are good for you.  Eat avocados. 8.  Increase your consumption of fiber.  Fruits, berries, vegetables, whole grains, flaxseed, chia seeds, beans, popcorn, nuts, oatmeal are good sources of fiber 9.  Use vinegar in your diet, i.e. apple cider vinegar, red wine or balsamic vinegar 10.  You can try fasting.  For example you can skip breakfast and lunch every other day (24-hour fast) 11.  Stress reduction, good night sleep, relaxation, meditation, yoga and other physical activity is likely to help you to maintain low weight too. 12.  If you drink alcohol, limit your alcohol intake to no more than 2 drinks a day.  Cardiac CT calcium scoring test $150 Tel # is [phone]   Computed tomography, more commonly known as a CT or CAT scan, is a diagnostic medical imaging test. Like traditional x-rays, it produces multiple images or pictures of the inside of the body. The cross-sectional images generated during a CT  scan can be reformatted in multiple planes. They can even generate three-dimensional images. These images can be viewed on a computer monitor, printed on film or by a 3D printer, or transferred to a CD or DVD. CT images of internal organs, bones, soft tissue and blood vessels provide greater detail than traditional x-rays, particularly of soft tissues and blood vessels. A cardiac CT scan for coronary calcium is a non-invasive way of obtaining information about the presence, location and extent of calcified plaque in the coronary arteries--the vessels that supply oxygen-containing blood to the heart muscle. Calcified plaque results when there is a build-up of fat and other substances under the inner layer of the artery. This material can calcify which signals the presence of atherosclerosis, a disease of the vessel wall, also called coronary artery disease (CAD). People with this disease have an increased risk for heart attacks. In addition, over time, progression of plaque build up (CAD) can narrow the arteries or even close off blood flow to the heart. The result may be chest pain, sometimes called "angina," or a heart attack. Because calcium is a marker of CAD, the amount of calcium detected on a cardiac CT scan is a helpful prognostic tool. The findings on cardiac CT are expressed as a calcium score. Another name for this test is coronary artery calcium scoring.  What are some common uses of the procedure? The goal of cardiac CT scan for calcium scoring is to determine if CAD is present and to what extent, even  if there are no symptoms. It is a screening study that may be recommended by a physician for patients with risk factors for CAD but no clinical symptoms. The major risk factors for CAD are: . high blood cholesterol levels  . family history of heart attacks  . diabetes  . high blood pressure  . cigarette smoking  . overweight or obese  . physical inactivity   A negative cardiac CT scan for  calcium scoring shows no calcification within the coronary arteries. This suggests that CAD is absent or so minimal it cannot be seen by this technique. The chance of having a heart attack over the next two to five years is very low under these circumstances. A positive test means that CAD is present, regardless of whether or not the patient is experiencing any symptoms. The amount of calcification--expressed as the calcium score--may help to predict the likelihood of a myocardial infarction (heart attack) in the coming years and helps your medical doctor or cardiologist decide whether the patient may need to take preventive medicine or undertake other measures such as diet and exercise to lower the risk for heart attack. The extent of CAD is graded according to your calcium score:  Calcium Score  Presence of CAD (coronary artery disease)  0 No evidence of CAD   1-10 Minimal evidence of CAD  11-100 Mild evidence of CAD  101-400 Moderate evidence of CAD  Over 400 Extensive evidence of CAD

## 2020-08-04 NOTE — Assessment & Plan Note (Signed)
Clonazepam prn  Potential benefits of a long term benzodiazepines  use as well as potential risks  and complications were explained to the patient and were aknowledged.  

## 2020-08-05 MED ORDER — BUDESONIDE-FORMOTEROL FUMARATE 160-4.5 MCG/ACT IN AERO: 2.0000 | INHALATION_SPRAY | Freq: Two times a day (BID) | RESPIRATORY_TRACT | 2 refills | Status: DC

## 2020-08-06 ENCOUNTER — Other Ambulatory Visit: Payer: Self-pay | Admitting: Internal Medicine

## 2020-08-06 MED ORDER — TRIAMCINOLONE ACETONIDE 0.1 % EX CREA: 1.0000 "application " | TOPICAL_CREAM | Freq: Four times a day (QID) | CUTANEOUS | 0 refills | Status: DC

## 2020-08-12 ENCOUNTER — Other Ambulatory Visit: Payer: Self-pay | Admitting: Internal Medicine

## 2020-08-12 MED ORDER — METRONIDAZOLE 0.75 % EX CREA: TOPICAL_CREAM | Freq: Two times a day (BID) | CUTANEOUS | 1 refills | Status: DC

## 2020-08-17 ENCOUNTER — Other Ambulatory Visit: Payer: Self-pay | Admitting: Internal Medicine

## 2020-08-18 ENCOUNTER — Other Ambulatory Visit: Payer: Self-pay | Admitting: Internal Medicine

## 2020-08-18 MED ORDER — METFORMIN HCL 500 MG PO TABS
ORAL_TABLET | ORAL | 3 refills | Status: DC
Start: 2020-08-18 — End: 2021-03-24

## 2020-08-24 ENCOUNTER — Other Ambulatory Visit: Payer: Self-pay

## 2020-08-24 ENCOUNTER — Ambulatory Visit (INDEPENDENT_AMBULATORY_CARE_PROVIDER_SITE_OTHER)
Admission: RE | Admit: 2020-08-24 | Discharge: 2020-08-24 | Disposition: A | Discharge status: Home / Self Care | Payer: Self-pay | Source: Ambulatory Visit | Attending: Internal Medicine | Admitting: Internal Medicine

## 2020-08-24 DIAGNOSIS — Z8249 Family history of ischemic heart disease and other diseases of the circulatory system: Secondary | ICD-10-CM

## 2020-08-25 ENCOUNTER — Inpatient Hospital Stay: Admission: RE | Admit: 2020-08-25 | Payer: 59 | Source: Ambulatory Visit

## 2020-10-21 ENCOUNTER — Other Ambulatory Visit: Payer: Self-pay | Admitting: Internal Medicine

## 2020-11-11 ENCOUNTER — Other Ambulatory Visit: Payer: Self-pay | Admitting: Internal Medicine

## 2020-11-11 MED ORDER — BUDESONIDE-FORMOTEROL FUMARATE 160-4.5 MCG/ACT IN AERO
2.0000 | INHALATION_SPRAY | Freq: Two times a day (BID) | RESPIRATORY_TRACT | 2 refills | Status: DC
Start: 2020-11-11 — End: 2021-03-01

## 2020-11-25 ENCOUNTER — Other Ambulatory Visit: Payer: Self-pay | Admitting: Internal Medicine

## 2021-01-17 ENCOUNTER — Other Ambulatory Visit: Payer: Self-pay | Admitting: Internal Medicine

## 2021-01-25 ENCOUNTER — Other Ambulatory Visit: Payer: Self-pay | Admitting: Internal Medicine

## 2021-02-02 ENCOUNTER — Encounter: Payer: 59 | Admitting: Internal Medicine

## 2021-02-10 ENCOUNTER — Other Ambulatory Visit: Payer: Self-pay | Admitting: Internal Medicine

## 2021-02-10 DIAGNOSIS — Z1231 Encounter for screening mammogram for malignant neoplasm of breast: Secondary | ICD-10-CM

## 2021-02-14 ENCOUNTER — Ambulatory Visit
Admission: RE | Admit: 2021-02-14 | Discharge: 2021-02-14 | Disposition: A | Discharge status: Home / Self Care | Payer: 59 | Source: Ambulatory Visit | Attending: Internal Medicine | Admitting: Internal Medicine

## 2021-02-14 ENCOUNTER — Other Ambulatory Visit: Payer: Self-pay

## 2021-02-14 DIAGNOSIS — Z1231 Encounter for screening mammogram for malignant neoplasm of breast: Secondary | ICD-10-CM

## 2021-02-15 ENCOUNTER — Encounter: Payer: 59 | Admitting: Internal Medicine

## 2021-02-24 ENCOUNTER — Other Ambulatory Visit: Payer: Self-pay | Admitting: Internal Medicine

## 2021-03-01 MED ORDER — BUDESONIDE-FORMOTEROL FUMARATE 160-4.5 MCG/ACT IN AERO: 2.0000 | INHALATION_SPRAY | Freq: Two times a day (BID) | RESPIRATORY_TRACT | 2 refills | Status: DC

## 2021-03-01 NOTE — Telephone Encounter (Signed)
Completed PA vis cover-my-meds w/ (Key: [id number]). Rec'd msg back stating " Member should be able to get the drug/product without a PA at this time." Sent pt information via mychart.Marland KitchenJohny Chess

## 2021-03-02 NOTE — Telephone Encounter (Signed)
Submitted PA for generic Symbicort w/ (Key: [id number]). Rec'd msg stating " [employer] is processing your PA request and will respond shortly with next steps. You may close this dialog, return to your dashboard, and perform other tasks. To check for an update later, open this request again from your dashboard."...Sylvia Alvarado

## 2021-03-04 NOTE — Telephone Encounter (Signed)
Rec'd PA determination fax it states the request for Budesonide-formoterol does not require PA because it is a covered benefit on plan. Notified pt via mychart w/ status.Marland KitchenJohny Chess

## 2021-03-15 ENCOUNTER — Other Ambulatory Visit: Payer: Self-pay

## 2021-03-16 ENCOUNTER — Ambulatory Visit (INDEPENDENT_AMBULATORY_CARE_PROVIDER_SITE_OTHER): Payer: 59 | Admitting: Internal Medicine

## 2021-03-16 ENCOUNTER — Encounter: Payer: Self-pay | Admitting: Internal Medicine

## 2021-03-16 VITALS — BP 132/80 | HR 73 | Temp 98.0°F | Ht 67.0 in | Wt 329.6 lb

## 2021-03-16 DIAGNOSIS — Z23 Encounter for immunization: Secondary | ICD-10-CM | POA: Diagnosis not present

## 2021-03-16 DIAGNOSIS — R739 Hyperglycemia, unspecified: Secondary | ICD-10-CM | POA: Diagnosis not present

## 2021-03-16 DIAGNOSIS — R4189 Other symptoms and signs involving cognitive functions and awareness: Secondary | ICD-10-CM | POA: Diagnosis not present

## 2021-03-16 DIAGNOSIS — Z0000 Encounter for general adult medical examination without abnormal findings: Secondary | ICD-10-CM | POA: Diagnosis not present

## 2021-03-16 LAB — URINALYSIS
Bilirubin Urine: NEGATIVE
Hgb urine dipstick: NEGATIVE
Ketones, ur: NEGATIVE
Leukocytes,Ua: NEGATIVE
Nitrite: NEGATIVE
Specific Gravity, Urine: <=1.005 — AB (ref 1.000–1.030)
Total Protein, Urine: NEGATIVE
Urine Glucose: NEGATIVE
Urobilinogen, UA: 0.2 (ref 0.0–1.0)
pH: 7 (ref 5.0–8.0)

## 2021-03-16 LAB — TSH: TSH: 0.56 u[IU]/mL (ref 0.35–4.50)

## 2021-03-16 LAB — COMPREHENSIVE METABOLIC PANEL
ALT: 52 U/L — ABNORMAL HIGH (ref 0–35)
AST: 56 U/L — ABNORMAL HIGH (ref 0–37)
Albumin: 4.1 g/dL (ref 3.5–5.2)
Alkaline Phosphatase: 54 U/L (ref 39–117)
BUN: 15 mg/dL (ref 6–23)
CO2: 29 mEq/L (ref 19–32)
Calcium: 9.4 mg/dL (ref 8.4–10.5)
Chloride: 102 mEq/L (ref 96–112)
Creatinine, Ser: 0.69 mg/dL (ref 0.40–1.20)
GFR: 103.21 mL/min (ref >60.00)
Glucose, Bld: 117 mg/dL — ABNORMAL HIGH (ref 70–99)
Potassium: 4.2 mEq/L (ref 3.5–5.1)
Sodium: 138 mEq/L (ref 135–145)
Total Bilirubin: 0.4 mg/dL (ref 0.2–1.2)
Total Protein: 7.4 g/dL (ref 6.0–8.3)

## 2021-03-16 LAB — CBC WITH DIFFERENTIAL/PLATELET
Basophils Absolute: 0.1 10*3/uL (ref 0.0–0.1)
Basophils Relative: 1.2 % (ref 0.0–3.0)
Eosinophils Absolute: 0.1 10*3/uL (ref 0.0–0.7)
Eosinophils Relative: 1 % (ref 0.0–5.0)
HCT: 40 % (ref 36.0–46.0)
Hemoglobin: 13.2 g/dL (ref 12.0–15.0)
Lymphocytes Relative: 33.8 % (ref 12.0–46.0)
Lymphs Abs: 2.7 10*3/uL (ref 0.7–4.0)
MCHC: 32.9 g/dL (ref 30.0–36.0)
MCV: 83.2 fl (ref 78.0–100.0)
Monocytes Absolute: 0.5 10*3/uL (ref 0.1–1.0)
Monocytes Relative: 6.3 % (ref 3.0–12.0)
Neutro Abs: 4.6 10*3/uL (ref 1.4–7.7)
Neutrophils Relative %: 57.7 % (ref 43.0–77.0)
Platelets: 226 10*3/uL (ref 150.0–400.0)
RBC: 4.81 Mil/uL (ref 3.87–5.11)
RDW: 15.6 % — ABNORMAL HIGH (ref 11.5–15.5)
WBC: 7.9 10*3/uL (ref 4.0–10.5)

## 2021-03-16 LAB — LIPID PANEL
Cholesterol: 211 mg/dL — ABNORMAL HIGH (ref 0–200)
HDL: 63.1 mg/dL (ref >39.00)
LDL Cholesterol: 116 mg/dL — ABNORMAL HIGH (ref 0–99)
NonHDL: 147.8
Total CHOL/HDL Ratio: 3
Triglycerides: 161 mg/dL — ABNORMAL HIGH (ref 0.0–149.0)
VLDL: 32.2 mg/dL (ref 0.0–40.0)

## 2021-03-16 LAB — MICROALBUMIN / CREATININE URINE RATIO
Creatinine,U: 25.4 mg/dL
Microalb Creat Ratio: 2.8 mg/g (ref 0.0–30.0)
Microalb, Ur: <0.7 mg/dL (ref 0.0–1.9)

## 2021-03-16 LAB — HEMOGLOBIN A1C: Hgb A1c MFr Bld: 7.2 % — ABNORMAL HIGH (ref 4.6–6.5)

## 2021-03-16 NOTE — Assessment & Plan Note (Signed)

## 2021-03-16 NOTE — Progress Notes (Signed)
Subjective:  Patient ID: Sylvia Alvarado, female    DOB: 28-May-1971  Age: 50 y.o. MRN: [id number]  CC: Annual Exam   HPI Sylvia Alvarado presents for a well exam C/o brain fog, grief  Outpatient Medications Prior to Visit  Medication Sig Dispense Refill  . albuterol (PROVENTIL HFA;VENTOLIN HFA) 108 (90 Base) MCG/ACT inhaler Inhale 2 puffs into the lungs every 6 (six) hours as needed for wheezing or shortness of breath. 18 g 3  . budesonide-formoterol (SYMBICORT) 160-4.5 MCG/ACT inhaler Inhale 2 puffs into the lungs 2 (two) times daily. 10.2 g 2  . Cholecalciferol (VITAMIN D3) 50 MCG (2000 UT) capsule Take 1 capsule (2,000 Units total) by mouth daily. 100 capsule 3  . clonazePAM (KLONOPIN) 1 MG tablet TAKE 1 TABLET BY MOUTH THREE TIMES DAILY AS NEEDED 60 tablet 1  . Cyanocobalamin (VITAMIN B-12) 500 MCG SUBL 1 sl qd (Patient taking differently: Place 500 mcg under the tongue daily.) 100 tablet 3  . cyclobenzaprine (FLEXERIL) 10 MG tablet Take 1 tablet (10 mg total) by mouth 2 (two) times daily. Annual appt is due must see provider for future refills 60 tablet 0  . diclofenac sodium (VOLTAREN) 1 % GEL APPLY 2-4 GRAMS TO AFFECTED AREA TWICE AS DIRECTED AS NEEDED. 200 g 0  . DULoxetine (CYMBALTA) 30 MG capsule TAKE ONE CAPSULE BY MOUTH DAILY 30 capsule 5  . estradiol (ESTRACE) 2 MG tablet Take 1 tablet (2 mg total) by mouth daily. 90 tablet 3  . fluticasone (FLONASE) 50 MCG/ACT nasal spray Place 2 sprays into both nostrils daily. 16 g 6  . furosemide (LASIX) 20 MG tablet 1po prn swelling 30 tablet 3  . metFORMIN (GLUCOPHAGE) 500 MG tablet TAKE 1 TABLET BY MOUTH twice a day 180 tablet 3  . metoprolol succinate (TOPROL-XL) 25 MG 24 hr tablet Take 1 tablet (25 mg total) by mouth daily. 90 tablet 3  . metroNIDAZOLE (METROCREAM) 0.75 % cream Apply topically 2 (two) times daily. 45 g 1  . pantoprazole (PROTONIX) 40 MG tablet TAKE 1 TABLET BY MOUTH TWICE DAILY 180 tablet 1  . progesterone  (PROMETRIUM) 200 MG capsule TAKE ONE CAPSULE BY MOUTH AT BEDTIME 30 capsule 6  . triamcinolone cream (KENALOG) 0.1 % Apply 1 application topically 4 (four) times daily. 80 g 0   No facility-administered medications prior to visit.    ROS: Review of Systems  Constitutional: Negative for activity change, appetite change, chills, fatigue and unexpected weight change.  HENT: Negative for congestion, mouth sores and sinus pressure.   Eyes: Negative for visual disturbance.  Respiratory: Negative for cough and chest tightness.   Gastrointestinal: Negative for abdominal pain and nausea.  Genitourinary: Negative for difficulty urinating, frequency and vaginal pain.  Musculoskeletal: Negative for back pain and gait problem.  Skin: Negative for pallor and rash.  Neurological: Negative for dizziness, tremors, weakness, numbness and headaches.  Psychiatric/Behavioral: Positive for decreased concentration and dysphoric mood. Negative for confusion, sleep disturbance and suicidal ideas. The patient is nervous/anxious.     Objective:  BP 132/80 (BP Location: Left Arm)   Pulse 73   Temp 98 F (36.7 C) (Oral)   Ht 5\' 7"  (1.702 m)   Wt (!) 329 lb 9.6 oz (149.5 kg)   SpO2 98%   BMI 51.62 kg/m   BP Readings from Last 3 Encounters:  03/16/21 132/80  08/04/20 (!) 150/90  02/02/20 132/82    Wt Readings from Last 3 Encounters:  03/16/21 (!) 329 lb 9.6  oz (149.5 kg)  08/04/20 (!) 322 lb (146.1 kg)  02/02/20 (!) 310 lb (140.6 kg)    Physical Exam Constitutional:      General: She is not in acute distress.    Appearance: She is well-developed. She is obese.  HENT:     Head: Normocephalic.     Right Ear: External ear normal.     Left Ear: External ear normal.     Nose: Nose normal.  Eyes:     General:        Right eye: No discharge.        Left eye: No discharge.     Conjunctiva/sclera: Conjunctivae normal.     Pupils: Pupils are equal, round, and reactive to light.  Neck:     Thyroid:  No thyromegaly.     Vascular: No JVD.     Trachea: No tracheal deviation.  Cardiovascular:     Rate and Rhythm: Normal rate and regular rhythm.     Heart sounds: Normal heart sounds.  Pulmonary:     Effort: No respiratory distress.     Breath sounds: No stridor. No wheezing.  Abdominal:     General: Bowel sounds are normal. There is no distension.     Palpations: Abdomen is soft. There is no mass.     Tenderness: There is no abdominal tenderness. There is no guarding or rebound.  Musculoskeletal:        General: No tenderness.     Cervical back: Normal range of motion and neck supple.  Lymphadenopathy:     Cervical: No cervical adenopathy.  Skin:    Findings: No erythema or rash.  Neurological:     Mental Status: She is oriented to person, place, and time.     Cranial Nerves: No cranial nerve deficit.     Motor: No abnormal muscle tone.     Coordination: Coordination normal.     Deep Tendon Reflexes: Reflexes normal.  Psychiatric:        Behavior: Behavior normal.        Thought Content: Thought content normal.        Judgment: Judgment normal.     Lab Results  Component Value Date   WBC 5.7 01/22/2020   HGB 15.0 01/22/2020   HCT 45.6 01/22/2020   PLT 217 01/22/2020   GLUCOSE 146 (H) 08/03/2020   CHOL 208 (H) 01/22/2020   TRIG 194 (H) 01/22/2020   HDL 54 01/22/2020   LDLDIRECT 135.0 03/18/2018   LDLCALC 120 (H) 01/22/2020   ALT 45 (H) 08/03/2020   AST 59 (H) 08/03/2020   NA 137 08/03/2020   K 4.4 08/03/2020   CL 101 08/03/2020   CREATININE 0.80 08/03/2020   BUN 15 08/03/2020   CO2 24 08/03/2020   TSH 1.430 01/22/2020   HGBA1C 6.5 (H) 08/03/2020    MM 3D SCREEN BREAST BILATERAL  Result Date: 02/15/2021 CLINICAL DATA:  Screening. EXAM: DIGITAL SCREENING BILATERAL MAMMOGRAM WITH TOMOSYNTHESIS AND CAD TECHNIQUE: Bilateral screening digital craniocaudal and mediolateral oblique mammograms were obtained. Bilateral screening digital breast tomosynthesis was  performed. The images were evaluated with computer-aided detection. COMPARISON:  Previous exam(s). ACR Breast Density Category a: The breast tissue is almost entirely fatty. FINDINGS: There are no findings suspicious for malignancy. The images were evaluated with computer-aided detection. IMPRESSION: No mammographic evidence of malignancy. A result letter of this screening mammogram will be mailed directly to the patient. RECOMMENDATION: Screening mammogram in one year. (Code:SM-B-01Y) BI-RADS CATEGORY  1: Negative. Electronically  Signed   By: Ammie Ferrier M.D.   On: 02/15/2021 14:48    Assessment & Plan:    Walker Kehr, MD

## 2021-03-16 NOTE — Addendum Note (Signed)
Addended by: Earnstine Regal on: 03/16/2021 09:16 AM   Modules accepted: Orders

## 2021-03-16 NOTE — Patient Instructions (Signed)
You can try [employer] Mushroom capsules for memory problems, decreased focus, mental fog, neuropathy ([email or web address])   

## 2021-03-16 NOTE — Addendum Note (Signed)
Addended by: Jacobo Forest on: 03/16/2021 08:55 AM   Modules accepted: Orders

## 2021-03-16 NOTE — Assessment & Plan Note (Addendum)
Try Sylvia Alvarado ADD testing offered

## 2021-03-17 ENCOUNTER — Other Ambulatory Visit: Payer: Self-pay | Admitting: Internal Medicine

## 2021-03-17 NOTE — Telephone Encounter (Signed)
Patient is requesting a refill of the following medications: Requested Prescriptions   Pending Prescriptions Disp Refills  . clonazePAM (KLONOPIN) 1 MG tablet [Pharmacy Med Name: clonazepam 1 mg tablet] 60 tablet 1    Sig: TAKE 1 TABLET BY MOUTH THREE TIMES DAILY AS NEEDED    Date of patient request: 03/17/21  Last office visit: 03/16/21  Date of last refill: 01/17/21  Last refill amount: 60,1 Follow up time period per chart: 06/15/21

## 2021-03-24 ENCOUNTER — Other Ambulatory Visit: Payer: Self-pay | Admitting: Internal Medicine

## 2021-03-24 DIAGNOSIS — R739 Hyperglycemia, unspecified: Secondary | ICD-10-CM

## 2021-03-24 DIAGNOSIS — E785 Hyperlipidemia, unspecified: Secondary | ICD-10-CM

## 2021-03-24 MED ORDER — METFORMIN HCL 500 MG PO TABS: 500.0000 mg | ORAL_TABLET | Freq: Three times a day (TID) | ORAL | 3 refills | Status: DC

## 2021-03-24 MED ORDER — CLONAZEPAM 1 MG PO TABS: 1.0000 mg | ORAL_TABLET | Freq: Three times a day (TID) | ORAL | 1 refills | Status: DC | PRN

## 2021-04-07 ENCOUNTER — Other Ambulatory Visit: Payer: Self-pay | Admitting: Internal Medicine

## 2021-04-07 MED ORDER — FLUTICASONE-SALMETEROL 100-50 MCG/ACT IN AEPB: 1.0000 | INHALATION_SPRAY | Freq: Two times a day (BID) | RESPIRATORY_TRACT | 11 refills | Status: DC

## 2021-04-08 ENCOUNTER — Other Ambulatory Visit: Payer: Self-pay | Admitting: Internal Medicine

## 2021-05-24 ENCOUNTER — Other Ambulatory Visit: Payer: Self-pay | Admitting: Internal Medicine

## 2021-06-03 ENCOUNTER — Other Ambulatory Visit: Payer: Self-pay | Admitting: Internal Medicine

## 2021-06-04 LAB — COMPREHENSIVE METABOLIC PANEL
ALT: 52 IU/L — ABNORMAL HIGH (ref 0–32)
AST: 85 IU/L — ABNORMAL HIGH (ref 0–40)
Albumin/Globulin Ratio: 1.5 (ref 1.2–2.2)
Albumin: 4.3 g/dL (ref 3.8–4.8)
Alkaline Phosphatase: 59 IU/L (ref 44–121)
BUN/Creatinine Ratio: 13 (ref 9–23)
BUN: 11 mg/dL (ref 6–24)
Bilirubin Total: 0.3 mg/dL (ref 0.0–1.2)
CO2: 22 mmol/L (ref 20–29)
Calcium: 9.5 mg/dL (ref 8.7–10.2)
Chloride: 100 mmol/L (ref 96–106)
Creatinine, Ser: 0.85 mg/dL (ref 0.57–1.00)
Globulin, Total: 2.8 g/dL (ref 1.5–4.5)
Glucose: 132 mg/dL — ABNORMAL HIGH (ref 65–99)
Potassium: 4.4 mmol/L (ref 3.5–5.2)
Sodium: 138 mmol/L (ref 134–144)
Total Protein: 7.1 g/dL (ref 6.0–8.5)
eGFR: 85 mL/min/{1.73_m2} (ref >59)

## 2021-06-04 LAB — LIPID PANEL W/O CHOL/HDL RATIO
Cholesterol, Total: 188 mg/dL (ref 100–199)
HDL: 47 mg/dL (ref >39)
LDL Chol Calc (NIH): 106 mg/dL — ABNORMAL HIGH (ref 0–99)
Triglycerides: 204 mg/dL — ABNORMAL HIGH (ref 0–149)
VLDL Cholesterol Cal: 35 mg/dL (ref 5–40)

## 2021-06-04 LAB — HGB A1C W/O EAG: Hgb A1c MFr Bld: 7.1 % — ABNORMAL HIGH (ref 4.8–5.6)

## 2021-06-15 ENCOUNTER — Encounter: Payer: Self-pay | Admitting: Internal Medicine

## 2021-06-15 ENCOUNTER — Other Ambulatory Visit: Payer: Self-pay

## 2021-06-15 ENCOUNTER — Ambulatory Visit (INDEPENDENT_AMBULATORY_CARE_PROVIDER_SITE_OTHER): Payer: 59 | Admitting: Internal Medicine

## 2021-06-15 DIAGNOSIS — Z6841 Body Mass Index (BMI) 40.0 and over, adult: Secondary | ICD-10-CM

## 2021-06-15 DIAGNOSIS — F4321 Adjustment disorder with depressed mood: Secondary | ICD-10-CM

## 2021-06-15 DIAGNOSIS — I10 Essential (primary) hypertension: Secondary | ICD-10-CM

## 2021-06-15 DIAGNOSIS — K760 Fatty (change of) liver, not elsewhere classified: Secondary | ICD-10-CM | POA: Diagnosis not present

## 2021-06-15 DIAGNOSIS — E1169 Type 2 diabetes mellitus with other specified complication: Secondary | ICD-10-CM | POA: Insufficient documentation

## 2021-06-15 DIAGNOSIS — R4189 Other symptoms and signs involving cognitive functions and awareness: Secondary | ICD-10-CM

## 2021-06-15 DIAGNOSIS — R5382 Chronic fatigue, unspecified: Secondary | ICD-10-CM

## 2021-06-15 DIAGNOSIS — E669 Obesity, unspecified: Secondary | ICD-10-CM | POA: Insufficient documentation

## 2021-06-15 HISTORY — DX: Type 2 diabetes mellitus with other specified complication: E11.69

## 2021-06-15 HISTORY — DX: Type 2 diabetes mellitus with other specified complication: E66.9

## 2021-06-15 MED ORDER — CLONAZEPAM 1 MG PO TABS: 1.0000 mg | ORAL_TABLET | Freq: Two times a day (BID) | ORAL | 1 refills | Status: DC | PRN

## 2021-06-15 MED ORDER — TIRZEPATIDE 2.5 MG/0.5ML ~~LOC~~ SOAJ: 2.5000 mg | SUBCUTANEOUS | 3 refills | Status: DC

## 2021-06-15 MED ORDER — AMPHETAMINE-DEXTROAMPHETAMINE 10 MG PO TABS: 10.0000 mg | ORAL_TABLET | Freq: Every day | ORAL | 0 refills | Status: DC

## 2021-06-15 NOTE — Assessment & Plan Note (Addendum)
Cont Metformin Try Mounjaro if covered

## 2021-06-15 NOTE — Assessment & Plan Note (Signed)
Controlled.  

## 2021-06-15 NOTE — Assessment & Plan Note (Addendum)
Not better On  [facility]

## 2021-06-15 NOTE — Assessment & Plan Note (Signed)
Will try Mounjaro if covered

## 2021-06-15 NOTE — Assessment & Plan Note (Addendum)
Elevated LFTs discussed Diet, needs to loose wt Try Mounjaro if covered

## 2021-06-15 NOTE — Progress Notes (Signed)
Subjective:  Patient ID: Sylvia Alvarado, female    DOB: [birth year]  Age: 50 y.o. MRN: [id number]  CC: Follow-up (3 month f/u)   HPI Sylvia Alvarado presents for DM, obesity, brain fog, elevated LFTs C/o starting projects - unable to finish  Outpatient Medications Prior to Visit  Medication Sig Dispense Refill   albuterol (PROVENTIL HFA;VENTOLIN HFA) 108 (90 Base) MCG/ACT inhaler Inhale 2 puffs into the lungs every 6 (six) hours as needed for wheezing or shortness of breath. 18 g 3   Cholecalciferol (VITAMIN D3) 50 MCG (2000 UT) capsule Take 1 capsule (2,000 Units total) by mouth daily. 100 capsule 3   clonazePAM (KLONOPIN) 1 MG tablet TAKE 1 TABLET BY MOUTH THREE TIMES DAILY AS NEEDED 90 tablet 1   Cyanocobalamin (VITAMIN B-12) 500 MCG SUBL 1 sl qd (Patient taking differently: Place 500 mcg under the tongue daily.) 100 tablet 3   cyclobenzaprine (FLEXERIL) 10 MG tablet Take 1 tablet (10 mg total) by mouth 2 (two) times daily. Annual appt is due must see provider for future refills 60 tablet 0   diclofenac sodium (VOLTAREN) 1 % GEL APPLY 2-4 GRAMS TO AFFECTED AREA TWICE AS DIRECTED AS NEEDED. 200 g 0   DULoxetine (CYMBALTA) 30 MG capsule TAKE ONE CAPSULE BY MOUTH DAILY 30 capsule 5   fluticasone (FLONASE) 50 MCG/ACT nasal spray PLACE TWO SPRAYS INTO BOTH NOSTRILS DAILY 16 g 6   fluticasone-salmeterol (ADVAIR) 100-50 MCG/ACT AEPB Inhale 1 puff into the lungs 2 (two) times daily. 1 each 11   furosemide (LASIX) 20 MG tablet 1po prn swelling 30 tablet 3   metFORMIN (GLUCOPHAGE) 500 MG tablet Take 1 tablet (500 mg total) by mouth in the morning, at noon, and at bedtime. TAKE 1 TABLET BY MOUTH twice a day 270 tablet 3   metoprolol succinate (TOPROL-XL) 25 MG 24 hr tablet Take 1 tablet (25 mg total) by mouth daily. 90 tablet 3   pantoprazole (PROTONIX) 40 MG tablet TAKE 1 TABLET BY MOUTH TWICE DAILY 180 tablet 1   estradiol (ESTRACE) 2 MG tablet Take 1 tablet (2 mg total) by mouth daily. 90  tablet 3   metroNIDAZOLE (METROCREAM) 0.75 % cream Apply topically 2 (two) times daily. (Patient not taking: Reported on 06/15/2021) 45 g 1   progesterone (PROMETRIUM) 200 MG capsule TAKE ONE CAPSULE BY MOUTH AT BEDTIME (Patient not taking: Reported on 06/15/2021) 30 capsule 6   triamcinolone cream (KENALOG) 0.1 % Apply 1 application topically 4 (four) times daily. 80 g 0   No facility-administered medications prior to visit.    ROS: Review of Systems  Constitutional:  Positive for fatigue. Negative for activity change, appetite change, chills and unexpected weight change.  HENT:  Negative for congestion, mouth sores and sinus pressure.   Eyes:  Negative for visual disturbance.  Respiratory:  Negative for cough and chest tightness.   Gastrointestinal:  Negative for abdominal pain and nausea.  Genitourinary:  Negative for difficulty urinating, frequency and vaginal pain.  Musculoskeletal:  Negative for back pain and gait problem.  Skin:  Negative for pallor and rash.  Neurological:  Negative for dizziness, tremors, weakness, numbness and headaches.  Psychiatric/Behavioral:  Positive for decreased concentration. Negative for confusion, sleep disturbance and suicidal ideas. The patient is nervous/anxious.    Objective:  BP 130/72 (BP Location: Left Arm)   Pulse 95   Temp 98.1 F (36.7 C) (Oral)   Ht '5\' 7"'$  (1.702 m)   Wt (!) 327 lb (148.3 kg)  SpO2 97%   BMI 51.22 kg/m   BP Readings from Last 3 Encounters:  06/15/21 130/72  03/16/21 132/80  08/04/20 (!) 150/90    Wt Readings from Last 3 Encounters:  06/15/21 (!) 327 lb (148.3 kg)  03/16/21 (!) 329 lb 9.6 oz (149.5 kg)  08/04/20 (!) 322 lb (146.1 kg)    Physical Exam Constitutional:      General: She is not in acute distress.    Appearance: She is well-developed. She is obese.  HENT:     Head: Normocephalic.     Right Ear: External ear normal.     Left Ear: External ear normal.     Nose: Nose normal.  Eyes:     General:         Right eye: No discharge.        Left eye: No discharge.     Conjunctiva/sclera: Conjunctivae normal.     Pupils: Pupils are equal, round, and reactive to light.  Neck:     Thyroid: No thyromegaly.     Vascular: No JVD.     Trachea: No tracheal deviation.  Cardiovascular:     Rate and Rhythm: Normal rate and regular rhythm.     Heart sounds: Normal heart sounds.  Pulmonary:     Effort: No respiratory distress.     Breath sounds: No stridor. No wheezing.  Abdominal:     General: Bowel sounds are normal. There is no distension.     Palpations: Abdomen is soft. There is no mass.     Tenderness: There is no abdominal tenderness. There is no guarding or rebound.  Musculoskeletal:        General: No tenderness.     Cervical back: Normal range of motion and neck supple. No rigidity.  Lymphadenopathy:     Cervical: No cervical adenopathy.  Skin:    Findings: No erythema or rash.  Neurological:     Cranial Nerves: No cranial nerve deficit.     Motor: No abnormal muscle tone.     Coordination: Coordination normal.     Deep Tendon Reflexes: Reflexes normal.  Psychiatric:        Behavior: Behavior normal.        Thought Content: Thought content normal.        Judgment: Judgment normal.    Lab Results  Component Value Date   WBC 7.9 03/16/2021   HGB 13.2 03/16/2021   HCT 40.0 03/16/2021   PLT 226.0 03/16/2021   GLUCOSE 132 (H) 06/03/2021   CHOL 188 06/03/2021   TRIG 204 (H) 06/03/2021   HDL 47 06/03/2021   LDLDIRECT 135.0 03/18/2018   LDLCALC 106 (H) 06/03/2021   ALT 52 (H) 06/03/2021   AST 85 (H) 06/03/2021   NA 138 06/03/2021   K 4.4 06/03/2021   CL 100 06/03/2021   CREATININE 0.85 06/03/2021   BUN 11 06/03/2021   CO2 22 06/03/2021   TSH 0.56 03/16/2021   HGBA1C 7.1 (H) 06/03/2021   MICROALBUR <0.7 03/16/2021    MM 3D SCREEN BREAST BILATERAL  Result Date: 02/15/2021 CLINICAL DATA:  Screening. EXAM: DIGITAL SCREENING BILATERAL MAMMOGRAM WITH TOMOSYNTHESIS AND  CAD TECHNIQUE: Bilateral screening digital craniocaudal and mediolateral oblique mammograms were obtained. Bilateral screening digital breast tomosynthesis was performed. The images were evaluated with computer-aided detection. COMPARISON:  Previous exam(s). ACR Breast Density Category a: The breast tissue is almost entirely fatty. FINDINGS: There are no findings suspicious for malignancy. The images were evaluated with computer-aided detection. IMPRESSION: No  mammographic evidence of malignancy. A result letter of this screening mammogram will be mailed directly to the patient. RECOMMENDATION: Screening mammogram in one year. (Code:SM-B-01Y) BI-RADS CATEGORY  1: Negative. Electronically Signed   By: Ammie Ferrier M.D.   On: 02/15/2021 14:48    Assessment & Plan:     Walker Kehr, MD

## 2021-06-15 NOTE — Assessment & Plan Note (Signed)
In councelling

## 2021-06-15 NOTE — Assessment & Plan Note (Addendum)
CFS Will try Adderall low dose  Potential benefits of a long term amphetamines  use as well as potential risks  and complications were explained to the patient and were aknowledged.

## 2021-06-22 ENCOUNTER — Other Ambulatory Visit: Payer: Self-pay | Admitting: Internal Medicine

## 2021-07-11 ENCOUNTER — Other Ambulatory Visit: Payer: Self-pay | Admitting: Internal Medicine

## 2021-07-25 ENCOUNTER — Other Ambulatory Visit: Payer: Self-pay | Admitting: Internal Medicine

## 2021-08-23 ENCOUNTER — Other Ambulatory Visit: Payer: Self-pay | Admitting: Internal Medicine

## 2021-08-26 ENCOUNTER — Other Ambulatory Visit: Payer: Self-pay | Admitting: *Deleted

## 2021-08-26 MED ORDER — DICLOFENAC SODIUM 1 % EX GEL: CUTANEOUS | 3 refills | Status: DC

## 2021-08-31 ENCOUNTER — Other Ambulatory Visit: Payer: Self-pay | Admitting: Internal Medicine

## 2021-09-01 LAB — HGB A1C W/O EAG: Hgb A1c MFr Bld: 6.1 % — ABNORMAL HIGH (ref 4.8–5.6)

## 2021-09-01 LAB — COMPREHENSIVE METABOLIC PANEL
ALT: 44 IU/L — ABNORMAL HIGH (ref 0–32)
AST: 53 IU/L — ABNORMAL HIGH (ref 0–40)
Albumin/Globulin Ratio: 1.6 (ref 1.2–2.2)
Albumin: 4.3 g/dL (ref 3.8–4.8)
Alkaline Phosphatase: 54 IU/L (ref 44–121)
BUN/Creatinine Ratio: 16 (ref 9–23)
BUN: 13 mg/dL (ref 6–24)
Bilirubin Total: 0.3 mg/dL (ref 0.0–1.2)
CO2: 20 mmol/L (ref 20–29)
Calcium: 9.9 mg/dL (ref 8.7–10.2)
Chloride: 103 mmol/L (ref 96–106)
Creatinine, Ser: 0.83 mg/dL (ref 0.57–1.00)
Globulin, Total: 2.7 g/dL (ref 1.5–4.5)
Glucose: 120 mg/dL — ABNORMAL HIGH (ref 70–99)
Potassium: 4.4 mmol/L (ref 3.5–5.2)
Sodium: 140 mmol/L (ref 134–144)
Total Protein: 7 g/dL (ref 6.0–8.5)
eGFR: 87 mL/min/{1.73_m2} (ref >59)

## 2021-09-05 ENCOUNTER — Other Ambulatory Visit: Payer: Self-pay | Admitting: Internal Medicine

## 2021-09-12 ENCOUNTER — Encounter: Payer: Self-pay | Admitting: Internal Medicine

## 2021-09-12 ENCOUNTER — Other Ambulatory Visit: Payer: Self-pay

## 2021-09-12 ENCOUNTER — Ambulatory Visit (INDEPENDENT_AMBULATORY_CARE_PROVIDER_SITE_OTHER): Payer: 59 | Admitting: Internal Medicine

## 2021-09-12 DIAGNOSIS — R220 Localized swelling, mass and lump, head: Secondary | ICD-10-CM | POA: Diagnosis not present

## 2021-09-12 MED ORDER — MELOXICAM 15 MG PO TABS
15.0000 mg | ORAL_TABLET | Freq: Every day | ORAL | 0 refills | Status: DC
Start: 2021-09-12 — End: 2021-10-31

## 2021-09-12 NOTE — Assessment & Plan Note (Signed)
Concern for underlying infection. She has started amoxicillin 500 mg TID and has 1 week supply, advised to take this whole course. Use ice for the swelling. If any change in breathing go to ER. If swelling worsens call and we may need to order CT to assess for underlying jaw or bone infection or soft tissue. Rx meloxicam for pain and swelling.

## 2021-09-12 NOTE — Progress Notes (Signed)
   Subjective:   Patient ID: Sylvia Alvarado, female    DOB: December 24, 1970, 50 y.o.   MRN: [id number]  HPI The patient is a 50 YO female coming in for concerns about facial swelling. Having some problems with a crown on the right side. Called telehealth over the weekend and started amoxicillin TID 500 mg taken 3 doses already. Started swelling up and hurting. Thinks crown is finally better and no pain with eating on that side. No new medications prior to swelling. No problems swallowing or breathing.   Review of Systems  Constitutional: Negative.   HENT:  Positive for dental problem and facial swelling.   Eyes: Negative.   Respiratory:  Negative for cough, chest tightness and shortness of breath.   Cardiovascular:  Negative for chest pain, palpitations and leg swelling.  Gastrointestinal:  Negative for abdominal distention, abdominal pain, constipation, diarrhea, nausea and vomiting.  Musculoskeletal: Negative.   Skin: Negative.   Neurological: Negative.   Psychiatric/Behavioral: Negative.     Objective:  Physical Exam Constitutional:      Appearance: She is well-developed. She is obese.  HENT:     Head: Normocephalic and atraumatic.     Ears:     Comments: Right cheek and neck swelling without discrete mass, mouth examined without purulent drainage or swelling of the gums Cardiovascular:     Rate and Rhythm: Normal rate and regular rhythm.  Pulmonary:     Effort: Pulmonary effort is normal. No respiratory distress.     Breath sounds: Normal breath sounds. No wheezing or rales.  Abdominal:     General: Bowel sounds are normal. There is no distension.     Palpations: Abdomen is soft.     Tenderness: There is no abdominal tenderness. There is no rebound.  Musculoskeletal:     Cervical back: Normal range of motion.  Skin:    General: Skin is warm and dry.  Neurological:     Mental Status: She is alert and oriented to person, place, and time.     Coordination: Coordination normal.     Vitals:   09/12/21 1304  BP: 130/68  Pulse: 71  Resp: 18  SpO2: 97%  Weight: (!) 313 lb 14.4 oz (142.4 kg)  Height: 5\' 7"  (1.702 m)    This visit occurred during the SARS-CoV-2 public health emergency.  Safety protocols were in place, including screening questions prior to the visit, additional usage of staff PPE, and extensive cleaning of exam room while observing appropriate contact time as indicated for disinfecting solutions.   Assessment & Plan:

## 2021-09-12 NOTE — Patient Instructions (Addendum)
Let us know in 1-2 days if the swelling is not going down.  We have sent in meloxicam to take daily to help the swelling and use ice on the area.

## 2021-09-15 ENCOUNTER — Encounter: Payer: Self-pay | Admitting: Internal Medicine

## 2021-09-15 ENCOUNTER — Ambulatory Visit (INDEPENDENT_AMBULATORY_CARE_PROVIDER_SITE_OTHER): Payer: 59 | Admitting: Internal Medicine

## 2021-09-15 ENCOUNTER — Other Ambulatory Visit: Payer: Self-pay

## 2021-09-15 DIAGNOSIS — R4189 Other symptoms and signs involving cognitive functions and awareness: Secondary | ICD-10-CM

## 2021-09-15 DIAGNOSIS — E1169 Type 2 diabetes mellitus with other specified complication: Secondary | ICD-10-CM

## 2021-09-15 DIAGNOSIS — Z23 Encounter for immunization: Secondary | ICD-10-CM | POA: Diagnosis not present

## 2021-09-15 DIAGNOSIS — E669 Obesity, unspecified: Secondary | ICD-10-CM

## 2021-09-15 DIAGNOSIS — Z9989 Dependence on other enabling machines and devices: Secondary | ICD-10-CM

## 2021-09-15 DIAGNOSIS — I10 Essential (primary) hypertension: Secondary | ICD-10-CM

## 2021-09-15 DIAGNOSIS — G4733 Obstructive sleep apnea (adult) (pediatric): Secondary | ICD-10-CM

## 2021-09-15 DIAGNOSIS — Z6841 Body Mass Index (BMI) 40.0 and over, adult: Secondary | ICD-10-CM

## 2021-09-15 DIAGNOSIS — K760 Fatty (change of) liver, not elsewhere classified: Secondary | ICD-10-CM

## 2021-09-15 MED ORDER — TIRZEPATIDE 5 MG/0.5ML ~~LOC~~ SOAJ: 5.0000 mg | SUBCUTANEOUS | 3 refills | Status: DC

## 2021-09-15 MED ORDER — METHYLPHENIDATE HCL 5 MG PO TABS: 5.0000 mg | ORAL_TABLET | Freq: Two times a day (BID) | ORAL | 0 refills | Status: DC

## 2021-09-15 NOTE — Assessment & Plan Note (Signed)
LFTs are better w/wt loss

## 2021-09-15 NOTE — Assessment & Plan Note (Signed)
On low dose Adderall - d/c Try Ritalin

## 2021-09-15 NOTE — Assessment & Plan Note (Signed)
BP Readings from Last 3 Encounters:  09/15/21 (!) 141/82  09/12/21 130/68  06/15/21 130/72   on Toprol

## 2021-09-15 NOTE — Assessment & Plan Note (Signed)
Cont [employer]

## 2021-09-15 NOTE — Assessment & Plan Note (Signed)
Pt lost 15 lbs. Increase Mounjaro to the next dose up

## 2021-09-15 NOTE — Assessment & Plan Note (Signed)
On CPAP. ?

## 2021-09-15 NOTE — Progress Notes (Signed)
Subjective:  Patient ID: Sylvia Alvarado, female    DOB: Dec 20, 1970  Age: 50 y.o. MRN: [id number]  CC: Follow-up (3 month f/u- Flu shot)   HPI BRITNI DRISCOLL presents for brain fog, ADD, sleep disorder. Pt lost 15 lbs. C/o anxiety on higher dose of Adderall (5 mg/d)  Outpatient Medications Prior to Visit  Medication Sig Dispense Refill   albuterol (PROVENTIL HFA;VENTOLIN HFA) 108 (90 Base) MCG/ACT inhaler Inhale 2 puffs into the lungs every 6 (six) hours as needed for wheezing or shortness of breath. 18 g 3   amoxicillin (AMOXIL) 500 MG capsule Take 500 mg by mouth every 8 (eight) hours.     Cholecalciferol (VITAMIN D3) 50 MCG (2000 UT) capsule Take 1 capsule (2,000 Units total) by mouth daily. 100 capsule 3   clonazePAM (KLONOPIN) 1 MG tablet TAKE 1 TABLET BY MOUTH TWICE DAILY AS NEEDED 60 tablet 1   Cyanocobalamin (VITAMIN B-12) 500 MCG SUBL 1 sl qd (Patient taking differently: Place 500 mcg under the tongue daily.) 100 tablet 3   cyclobenzaprine (FLEXERIL) 10 MG tablet TAKE 1 TABLET BY MOUTH TWICE DAILY 60 tablet 3   diclofenac sodium (VOLTAREN) 1 % GEL APPLY 2-4 GRAMS TO AFFECTED AREA TWICE AS DIRECTED AS NEEDED. 200 g 0   diclofenac Sodium (VOLTAREN) 1 % GEL Apply 2-4 grams to area as needed 200 g 3   DULoxetine (CYMBALTA) 30 MG capsule TAKE ONE CAPSULE BY MOUTH DAILY 30 capsule 5   fluticasone (FLONASE) 50 MCG/ACT nasal spray PLACE TWO SPRAYS INTO BOTH NOSTRILS DAILY 16 g 6   fluticasone-salmeterol (ADVAIR) 100-50 MCG/ACT AEPB Inhale 1 puff into the lungs 2 (two) times daily. 1 each 11   furosemide (LASIX) 20 MG tablet 1po prn swelling 30 tablet 3   meloxicam (MOBIC) 15 MG tablet Take 1 tablet (15 mg total) by mouth daily. 30 tablet 0   metFORMIN (GLUCOPHAGE) 500 MG tablet Take 1 tablet (500 mg total) by mouth in the morning, at noon, and at bedtime. TAKE 1 TABLET BY MOUTH twice a day 270 tablet 3   metoprolol succinate (TOPROL-XL) 25 MG 24 hr tablet TAKE 1 TABLET BY MOUTH EVERY DAY  90 tablet 3   pantoprazole (PROTONIX) 40 MG tablet TAKE 1 TABLET BY MOUTH TWICE DAILY 180 tablet 1   triamcinolone cream (KENALOG) 0.1 % APPLY TO THE AFFECTED AREA(S) FOUR TIMES DAILY 80 g 0   amphetamine-dextroamphetamine (ADDERALL) 10 MG tablet TAKE 1 TABLET BY MOUTH DAILY WITH BREAKFAST 30 tablet 0   tirzepatide (MOUNJARO) 2.5 MG/0.5ML Pen Inject 2.5 mg into the skin once a week. 2 mL 3   No facility-administered medications prior to visit.    ROS: Review of Systems  Constitutional:  Negative for activity change, appetite change, chills, fatigue and unexpected weight change.  HENT:  Negative for congestion, mouth sores and sinus pressure.   Eyes:  Negative for visual disturbance.  Respiratory:  Negative for cough and chest tightness.   Gastrointestinal:  Negative for abdominal pain and nausea.  Genitourinary:  Negative for difficulty urinating, frequency and vaginal pain.  Musculoskeletal:  Negative for back pain and gait problem.  Skin:  Negative for pallor and rash.  Neurological:  Negative for dizziness, tremors, weakness, numbness and headaches.  Psychiatric/Behavioral:  Positive for decreased concentration and sleep disturbance. Negative for confusion and suicidal ideas. The patient is nervous/anxious.    Objective:  BP (!) 141/82 (BP Location: Left Arm)   Pulse 83   Temp 98.1 F (36.7  C) (Oral)   Ht 5\' 7"  (1.702 m)   Wt (!) 312 lb 12.8 oz (141.9 kg)   SpO2 98%   BMI 48.99 kg/m   BP Readings from Last 3 Encounters:  09/15/21 (!) 141/82  09/12/21 130/68  06/15/21 130/72    Wt Readings from Last 3 Encounters:  09/15/21 (!) 312 lb 12.8 oz (141.9 kg)  09/12/21 (!) 313 lb 14.4 oz (142.4 kg)  06/15/21 (!) 327 lb (148.3 kg)    Physical Exam Constitutional:      General: She is not in acute distress.    Appearance: She is well-developed. She is obese.  HENT:     Head: Normocephalic.     Right Ear: External ear normal.     Left Ear: External ear normal.     Nose:  Nose normal.  Eyes:     General:        Right eye: No discharge.        Left eye: No discharge.     Conjunctiva/sclera: Conjunctivae normal.     Pupils: Pupils are equal, round, and reactive to light.  Neck:     Thyroid: No thyromegaly.     Vascular: No JVD.     Trachea: No tracheal deviation.  Cardiovascular:     Rate and Rhythm: Normal rate and regular rhythm.     Heart sounds: Normal heart sounds.  Pulmonary:     Effort: No respiratory distress.     Breath sounds: No stridor. No wheezing.  Abdominal:     General: Bowel sounds are normal. There is no distension.     Palpations: Abdomen is soft. There is no mass.     Tenderness: There is no abdominal tenderness. There is no guarding or rebound.  Musculoskeletal:        General: No tenderness.     Cervical back: Normal range of motion and neck supple. No rigidity.  Lymphadenopathy:     Cervical: No cervical adenopathy.  Skin:    Findings: No erythema or rash.  Neurological:     Cranial Nerves: No cranial nerve deficit.     Motor: No abnormal muscle tone.     Coordination: Coordination normal.     Deep Tendon Reflexes: Reflexes normal.  Psychiatric:        Behavior: Behavior normal.        Thought Content: Thought content normal.        Judgment: Judgment normal.    Lab Results  Component Value Date   WBC 7.9 03/16/2021   HGB 13.2 03/16/2021   HCT 40.0 03/16/2021   PLT 226.0 03/16/2021   GLUCOSE 120 (H) 08/31/2021   CHOL 188 06/03/2021   TRIG 204 (H) 06/03/2021   HDL 47 06/03/2021   LDLDIRECT 135.0 03/18/2018   LDLCALC 106 (H) 06/03/2021   ALT 44 (H) 08/31/2021   AST 53 (H) 08/31/2021   NA 140 08/31/2021   K 4.4 08/31/2021   CL 103 08/31/2021   CREATININE 0.83 08/31/2021   BUN 13 08/31/2021   CO2 20 08/31/2021   TSH 0.56 03/16/2021   HGBA1C 6.1 (H) 08/31/2021   MICROALBUR <0.7 03/16/2021    MM 3D SCREEN BREAST BILATERAL  Result Date: 02/15/2021 CLINICAL DATA:  Screening. EXAM: DIGITAL SCREENING  BILATERAL MAMMOGRAM WITH TOMOSYNTHESIS AND CAD TECHNIQUE: Bilateral screening digital craniocaudal and mediolateral oblique mammograms were obtained. Bilateral screening digital breast tomosynthesis was performed. The images were evaluated with computer-aided detection. COMPARISON:  Previous exam(s). ACR Breast Density Category a: The  breast tissue is almost entirely fatty. FINDINGS: There are no findings suspicious for malignancy. The images were evaluated with computer-aided detection. IMPRESSION: No mammographic evidence of malignancy. A result letter of this screening mammogram will be mailed directly to the patient. RECOMMENDATION: Screening mammogram in one year. (Code:SM-B-01Y) BI-RADS CATEGORY  1: Negative. Electronically Signed   By: Ammie Ferrier M.D.   On: 02/15/2021 14:48    Assessment & Plan:   Problem List Items Addressed This Visit     Brain fog    On low dose Adderall - d/c Try Ritalin      Diabetes mellitus type 2 in obese (HCC)    Pt lost 15 lbs. Increase Mounjaro to the next dose up      Relevant Medications   tirzepatide [facility]) 5 MG/0.5ML Pen   Essential hypertension    BP Readings from Last 3 Encounters:  09/15/21 (!) 141/82  09/12/21 130/68  06/15/21 130/72  on Toprol      Morbid obesity with BMI of 45.0-49.9, adult (HCC)    Cont Mounjaro      Relevant Medications   tirzepatide (MOUNJARO) 5 MG/0.5ML Pen   methylphenidate (RITALIN) 5 MG tablet   OSA on CPAP    On CPAP      Steatosis of liver    LFTs are better w/wt loss         Meds ordered this encounter  Medications   tirzepatide (MOUNJARO) 5 MG/0.5ML Pen    Sig: Inject 5 mg into the skin once a week.    Dispense:  6 mL    Refill:  3   methylphenidate (RITALIN) 5 MG tablet    Sig: Take 1 tablet (5 mg total) by mouth 2 (two) times daily with breakfast and lunch.    Dispense:  60 tablet    Refill:  0      Follow-up: Return in about 2 months (around 11/15/2021) for a follow-up  visit.  Walker Kehr, MD

## 2021-09-19 ENCOUNTER — Other Ambulatory Visit: Payer: Self-pay | Admitting: Internal Medicine

## 2021-10-13 ENCOUNTER — Encounter: Payer: Self-pay | Admitting: Internal Medicine

## 2021-10-14 ENCOUNTER — Other Ambulatory Visit: Payer: Self-pay | Admitting: Internal Medicine

## 2021-10-15 ENCOUNTER — Other Ambulatory Visit: Payer: Self-pay | Admitting: Internal Medicine

## 2021-10-17 MED ORDER — METHYLPHENIDATE HCL 5 MG PO TABS: 5.0000 mg | ORAL_TABLET | Freq: Two times a day (BID) | ORAL | 0 refills | Status: DC

## 2021-10-17 NOTE — Telephone Encounter (Signed)
Check [place] registry last filled 09/15/2021.Marland KitchenJohny Alvarado

## 2021-10-31 ENCOUNTER — Other Ambulatory Visit: Payer: Self-pay | Admitting: Internal Medicine

## 2021-11-01 ENCOUNTER — Encounter: Payer: Self-pay | Admitting: Internal Medicine

## 2021-11-02 NOTE — Telephone Encounter (Signed)
Need PA on Mounjaro.Marland KitchenJohny Chess

## 2021-11-02 NOTE — Telephone Encounter (Signed)
Submitted PA for [employer] w/ (Key: [id number]) to [employer]. Waiting on insurance determination.Marland KitchenAndee Poles

## 2021-11-04 NOTE — Telephone Encounter (Signed)
Check status on PA it states " [employer] is reviewing your PA request.". will recheck later.Marland KitchenJohny Alvarado

## 2021-11-11 NOTE — Telephone Encounter (Signed)
Patient calling in to check status of PA  Says she is having an insurance change 01.01.23 & was hoping for PA to be completed before then  Please fu w/ patient w/ an update [phone]

## 2021-11-16 NOTE — Telephone Encounter (Signed)
Insurance will not cover Mounjaro at ALL. Will cover ozempic w/ PA. Need rx for Ozempic sent to [facility].Marland KitchenJohny Chess

## 2021-11-16 NOTE — Telephone Encounter (Signed)
Need PA for [facility] under new insurance...Sylvia Alvarado

## 2021-11-17 ENCOUNTER — Other Ambulatory Visit: Payer: Self-pay

## 2021-11-17 ENCOUNTER — Ambulatory Visit (INDEPENDENT_AMBULATORY_CARE_PROVIDER_SITE_OTHER): Payer: 59 | Admitting: Internal Medicine

## 2021-11-17 ENCOUNTER — Encounter: Payer: Self-pay | Admitting: Internal Medicine

## 2021-11-17 DIAGNOSIS — E669 Obesity, unspecified: Secondary | ICD-10-CM

## 2021-11-17 DIAGNOSIS — R4189 Other symptoms and signs involving cognitive functions and awareness: Secondary | ICD-10-CM

## 2021-11-17 DIAGNOSIS — E1169 Type 2 diabetes mellitus with other specified complication: Secondary | ICD-10-CM

## 2021-11-17 DIAGNOSIS — I10 Essential (primary) hypertension: Secondary | ICD-10-CM

## 2021-11-17 MED ORDER — OZEMPIC (0.25 OR 0.5 MG/DOSE) 2 MG/1.5ML ~~LOC~~ SOPN: 0.5000 mg | PEN_INJECTOR | SUBCUTANEOUS | 3 refills | Status: DC

## 2021-11-17 MED ORDER — METHYLPHENIDATE HCL ER (OSM) 27 MG PO TBCR: 27.0000 mg | EXTENDED_RELEASE_TABLET | ORAL | 0 refills | Status: DC

## 2021-11-17 NOTE — Assessment & Plan Note (Signed)
Wt Readings from Last 3 Encounters:  11/17/21 (!) 304 lb 6.4 oz (138.1 kg)  09/15/21 (!) 312 lb 12.8 oz (141.9 kg)  09/12/21 (!) 313 lb 14.4 oz (142.4 kg)  D/c Mounjaro - not covered We will try Ozempic

## 2021-11-17 NOTE — Progress Notes (Signed)
Subjective:  Patient ID: Sylvia Alvarado, female    DOB: 08/08/1971  Age: 51 y.o. MRN: [id number]  CC: Follow-up (2 month f/u- Pt states the ritalin is not helping at all)   HPI Sylvia Alvarado presents for ADD, DM, obesity Ritalin not working [employer] - not covered  Outpatient Medications Prior to Visit  Medication Sig Dispense Refill   albuterol (PROVENTIL HFA;VENTOLIN HFA) 108 (90 Base) MCG/ACT inhaler Inhale 2 puffs into the lungs every 6 (six) hours as needed for wheezing or shortness of breath. 18 g 3   amoxicillin (AMOXIL) 500 MG capsule Take 500 mg by mouth every 8 (eight) hours.     Cholecalciferol (VITAMIN D3) 50 MCG (2000 UT) capsule Take 1 capsule (2,000 Units total) by mouth daily. 100 capsule 3   clonazePAM (KLONOPIN) 1 MG tablet TAKE 1 TABLET BY MOUTH TWICE DAILY AS NEEDED 60 tablet 1   Cyanocobalamin (VITAMIN B-12) 500 MCG SUBL 1 sl qd (Patient taking differently: Place 500 mcg under the tongue daily.) 100 tablet 3   cyclobenzaprine (FLEXERIL) 10 MG tablet TAKE 1 TABLET BY MOUTH TWICE DAILY 60 tablet 3   diclofenac sodium (VOLTAREN) 1 % GEL APPLY 2-4 GRAMS TO AFFECTED AREA TWICE AS DIRECTED AS NEEDED. 200 g 0   DULoxetine (CYMBALTA) 30 MG capsule TAKE ONE CAPSULE BY MOUTH DAILY 30 capsule 5   fluticasone (FLONASE) 50 MCG/ACT nasal spray PLACE TWO SPRAYS INTO BOTH NOSTRILS DAILY 16 g 6   fluticasone-salmeterol (ADVAIR) 100-50 MCG/ACT AEPB Inhale 1 puff into the lungs 2 (two) times daily. 1 each 11   furosemide (LASIX) 20 MG tablet 1po prn swelling 30 tablet 3   meloxicam (MOBIC) 15 MG tablet TAKE 1 TABLET BY MOUTH EVERY DAY 30 tablet 2   metFORMIN (GLUCOPHAGE) 500 MG tablet Take 1 tablet (500 mg total) by mouth in the morning, at noon, and at bedtime. TAKE 1 TABLET BY MOUTH twice a day 270 tablet 3   metoprolol succinate (TOPROL-XL) 25 MG 24 hr tablet TAKE 1 TABLET BY MOUTH EVERY DAY 90 tablet 3   pantoprazole (PROTONIX) 40 MG tablet TAKE 1 TABLET BY MOUTH TWICE DAILY 180  tablet 1   triamcinolone cream (KENALOG) 0.1 % APPLY TO THE AFFECTED AREA(S) FOUR TIMES DAILY 80 g 0   methylphenidate (RITALIN) 5 MG tablet TAKE 1 TABLET BY MOUTH TWICE DAILY WITH breakfast AND LUNCH 60 tablet 0   methylphenidate (RITALIN) 5 MG tablet Take 1 tablet (5 mg total) by mouth 2 (two) times daily with breakfast and lunch. 60 tablet 0   diclofenac Sodium (VOLTAREN) 1 % GEL Apply 2-4 grams to area as needed (Patient not taking: Reported on 11/17/2021) 200 g 3   tirzepatide (MOUNJARO) 5 MG/0.5ML Pen Inject 5 mg into the skin once a week. (Patient not taking: Reported on 11/17/2021) 6 mL 3   No facility-administered medications prior to visit.    ROS: Review of Systems  Constitutional:  Negative for activity change, appetite change, chills, fatigue and unexpected weight change.  HENT:  Negative for congestion, mouth sores and sinus pressure.   Eyes:  Negative for visual disturbance.  Respiratory:  Negative for cough and chest tightness.   Gastrointestinal:  Negative for abdominal pain and nausea.  Genitourinary:  Negative for difficulty urinating, frequency and vaginal pain.  Musculoskeletal:  Negative for back pain and gait problem.  Skin:  Negative for pallor and rash.  Neurological:  Negative for dizziness, tremors, weakness, numbness and headaches.  Psychiatric/Behavioral:  Positive  for decreased concentration and dysphoric mood. Negative for confusion and sleep disturbance.    Objective:  BP 132/82 (BP Location: Left Arm)    Pulse 73    Temp 98.2 F (36.8 C) (Oral)    Ht 5\' 7"  (1.702 m)    Wt (!) 304 lb 6.4 oz (138.1 kg)    SpO2 97%    BMI 47.68 kg/m   BP Readings from Last 3 Encounters:  11/17/21 132/82  09/15/21 (!) 141/82  09/12/21 130/68    Wt Readings from Last 3 Encounters:  11/17/21 (!) 304 lb 6.4 oz (138.1 kg)  09/15/21 (!) 312 lb 12.8 oz (141.9 kg)  09/12/21 (!) 313 lb 14.4 oz (142.4 kg)    Physical Exam Constitutional:      General: She is not in acute  distress.    Appearance: She is well-developed.  HENT:     Head: Normocephalic.     Right Ear: External ear normal.     Left Ear: External ear normal.     Nose: Nose normal.  Eyes:     General:        Right eye: No discharge.        Left eye: No discharge.     Conjunctiva/sclera: Conjunctivae normal.     Pupils: Pupils are equal, round, and reactive to light.  Neck:     Thyroid: No thyromegaly.     Vascular: No JVD.     Trachea: No tracheal deviation.  Cardiovascular:     Rate and Rhythm: Normal rate and regular rhythm.     Heart sounds: Normal heart sounds.  Pulmonary:     Effort: No respiratory distress.     Breath sounds: No stridor. No wheezing.  Abdominal:     General: Bowel sounds are normal. There is no distension.     Palpations: Abdomen is soft. There is no mass.     Tenderness: There is no abdominal tenderness. There is no guarding or rebound.  Musculoskeletal:        General: No tenderness.     Cervical back: Normal range of motion and neck supple. No rigidity.  Lymphadenopathy:     Cervical: No cervical adenopathy.  Skin:    Findings: No erythema or rash.  Neurological:     Cranial Nerves: No cranial nerve deficit.     Motor: No abnormal muscle tone.     Coordination: Coordination normal.     Deep Tendon Reflexes: Reflexes normal.  Psychiatric:        Behavior: Behavior normal.        Thought Content: Thought content normal.        Judgment: Judgment normal.    Lab Results  Component Value Date   WBC 7.9 03/16/2021   HGB 13.2 03/16/2021   HCT 40.0 03/16/2021   PLT 226.0 03/16/2021   GLUCOSE 120 (H) 08/31/2021   CHOL 188 06/03/2021   TRIG 204 (H) 06/03/2021   HDL 47 06/03/2021   LDLDIRECT 135.0 03/18/2018   LDLCALC 106 (H) 06/03/2021   ALT 44 (H) 08/31/2021   AST 53 (H) 08/31/2021   NA 140 08/31/2021   K 4.4 08/31/2021   CL 103 08/31/2021   CREATININE 0.83 08/31/2021   BUN 13 08/31/2021   CO2 20 08/31/2021   TSH 0.56 03/16/2021   HGBA1C 6.1  (H) 08/31/2021   MICROALBUR <0.7 03/16/2021    MM 3D SCREEN BREAST BILATERAL  Result Date: 02/15/2021 CLINICAL DATA:  Screening. EXAM: DIGITAL SCREENING BILATERAL MAMMOGRAM WITH  TOMOSYNTHESIS AND CAD TECHNIQUE: Bilateral screening digital craniocaudal and mediolateral oblique mammograms were obtained. Bilateral screening digital breast tomosynthesis was performed. The images were evaluated with computer-aided detection. COMPARISON:  Previous exam(s). ACR Breast Density Category a: The breast tissue is almost entirely fatty. FINDINGS: There are no findings suspicious for malignancy. The images were evaluated with computer-aided detection. IMPRESSION: No mammographic evidence of malignancy. A result letter of this screening mammogram will be mailed directly to the patient. RECOMMENDATION: Screening mammogram in one year. (Code:SM-B-01Y) BI-RADS CATEGORY  1: Negative. Electronically Signed   By: Ammie Ferrier M.D.   On: 02/15/2021 14:48    Assessment & Plan:   Problem List Items Addressed This Visit     Brain fog    We tried regular Adderall - d/c We tried Ritalin - d/c  We will try Concerta      Diabetes mellitus type 2 in obese ([place])    Wt Readings from Last 3 Encounters:  11/17/21 (!) 304 lb 6.4 oz (138.1 kg)  09/15/21 (!) 312 lb 12.8 oz (141.9 kg)  09/12/21 (!) 313 lb 14.4 oz (142.4 kg)  D/c Mounjaro - not covered We will try Ozempic       Relevant Medications   Semaglutide,0.25 or 0.5MG /DOS, (OZEMPIC, 0.25 OR 0.5 MG/DOSE,) 2 MG/1.5ML SOPN   Essential hypertension    Cont Toprol         Meds ordered this encounter  Medications   methylphenidate 27 MG PO CR tablet    Sig: Take 1 tablet (27 mg total) by mouth every morning.    Dispense:  30 tablet    Refill:  0   Semaglutide,0.25 or 0.5MG /DOS, (OZEMPIC, 0.25 OR 0.5 MG/DOSE,) 2 MG/1.5ML SOPN    Sig: Inject 0.5 mg into the skin once a week.    Dispense:  1.5 mL    Refill:  3      Follow-up: Return in about 3 months  (around 02/15/2022) for Wellness Exam.  Walker Kehr, MD

## 2021-11-17 NOTE — Assessment & Plan Note (Signed)
We tried regular Adderall - d/c We tried Ritalin - d/c  We will try Concerta

## 2021-11-17 NOTE — Assessment & Plan Note (Signed)
Cont Toprol

## 2021-11-20 ENCOUNTER — Other Ambulatory Visit: Payer: Self-pay | Admitting: Internal Medicine

## 2021-11-21 ENCOUNTER — Other Ambulatory Visit: Payer: Self-pay | Admitting: Internal Medicine

## 2021-11-21 NOTE — Telephone Encounter (Signed)
PA has been faxed to pts insurance.

## 2021-11-21 NOTE — Telephone Encounter (Signed)
Notified pt MD sent rx to [facility] instead of [facility] in [place]. Will you resend concerta to [facility]. Updated pharmacy.Marland KitchenJohny Chess

## 2021-11-21 NOTE — Telephone Encounter (Signed)
Patient checking status of refill/pharmacy request  Patient requesting methylphenidate 27 MG PO CR tablet sent to [place], [facility]  *see below*

## 2021-11-24 ENCOUNTER — Other Ambulatory Visit: Payer: Self-pay | Admitting: Internal Medicine

## 2021-11-24 MED ORDER — METHYLPHENIDATE HCL ER (OSM) 27 MG PO TBCR: 27.0000 mg | EXTENDED_RELEASE_TABLET | ORAL | 0 refills | Status: DC

## 2021-11-24 NOTE — Progress Notes (Signed)
x

## 2021-11-24 NOTE — Telephone Encounter (Signed)
PA has been denied.  

## 2021-11-25 NOTE — Telephone Encounter (Signed)
Pt had started an Appeal needing medical records/labs fa to insurance @ [phone]. Faxed clinical notes & labs...Sylvia Alvarado

## 2021-11-28 ENCOUNTER — Other Ambulatory Visit: Payer: Self-pay | Admitting: *Deleted

## 2021-11-28 MED ORDER — PANTOPRAZOLE SODIUM 40 MG PO TBEC: 40.0000 mg | DELAYED_RELEASE_TABLET | Freq: Every day | ORAL | 1 refills | Status: DC

## 2021-11-28 NOTE — Telephone Encounter (Signed)
Rec'd fax stating pt insurance will only cover one a day on her pantoprazole. Requesting new rx for one a day. Sent rx.Marland KitchenJohny Chess

## 2021-12-22 ENCOUNTER — Other Ambulatory Visit: Payer: Self-pay | Admitting: Internal Medicine

## 2021-12-22 MED ORDER — METHYLPHENIDATE HCL ER (OSM) 27 MG PO TBCR: 27.0000 mg | EXTENDED_RELEASE_TABLET | ORAL | 0 refills | Status: DC

## 2022-01-16 ENCOUNTER — Encounter: Payer: Self-pay | Admitting: Internal Medicine

## 2022-01-17 ENCOUNTER — Other Ambulatory Visit: Payer: Self-pay | Admitting: Internal Medicine

## 2022-01-18 ENCOUNTER — Encounter: Payer: Self-pay | Admitting: Internal Medicine

## 2022-01-18 ENCOUNTER — Other Ambulatory Visit: Payer: Self-pay | Admitting: Internal Medicine

## 2022-01-18 MED ORDER — ALBUTEROL SULFATE HFA 108 (90 BASE) MCG/ACT IN AERS: 2.0000 | INHALATION_SPRAY | Freq: Four times a day (QID) | RESPIRATORY_TRACT | 11 refills | Status: AC | PRN

## 2022-01-25 ENCOUNTER — Other Ambulatory Visit: Payer: Self-pay | Admitting: Internal Medicine

## 2022-01-25 DIAGNOSIS — Z1231 Encounter for screening mammogram for malignant neoplasm of breast: Secondary | ICD-10-CM

## 2022-01-25 MED ORDER — METHYLPHENIDATE HCL ER (OSM) 27 MG PO TBCR: 27.0000 mg | EXTENDED_RELEASE_TABLET | ORAL | 0 refills | Status: DC

## 2022-02-08 ENCOUNTER — Encounter: Payer: Self-pay | Admitting: Internal Medicine

## 2022-02-13 ENCOUNTER — Telehealth: Payer: Self-pay | Admitting: Internal Medicine

## 2022-02-13 NOTE — Telephone Encounter (Signed)
Along with the other yearly labs could you also order an FSH/LH and hormone panel. I had an endometrial ablation and have not had a period in years so I am not sure if I am pre menopausal or in menopause. ?

## 2022-02-13 NOTE — Telephone Encounter (Signed)
Pt called and states she has been sending messages to have lab orders put in before her appointment for her physical, as she normally does it that way.  ? ? ?Please send labs to pt preferred lab. States she works at a [employer].   ?

## 2022-02-16 ENCOUNTER — Other Ambulatory Visit: Payer: Self-pay | Admitting: Internal Medicine

## 2022-02-16 ENCOUNTER — Encounter: Payer: Self-pay | Admitting: Internal Medicine

## 2022-02-16 ENCOUNTER — Telehealth (INDEPENDENT_AMBULATORY_CARE_PROVIDER_SITE_OTHER): Payer: 59 | Admitting: Internal Medicine

## 2022-02-16 DIAGNOSIS — R4189 Other symptoms and signs involving cognitive functions and awareness: Secondary | ICD-10-CM

## 2022-02-16 DIAGNOSIS — N943 Premenstrual tension syndrome: Secondary | ICD-10-CM

## 2022-02-16 DIAGNOSIS — Z6841 Body Mass Index (BMI) 40.0 and over, adult: Secondary | ICD-10-CM

## 2022-02-16 DIAGNOSIS — R739 Hyperglycemia, unspecified: Secondary | ICD-10-CM

## 2022-02-16 DIAGNOSIS — L659 Nonscarring hair loss, unspecified: Secondary | ICD-10-CM

## 2022-02-16 DIAGNOSIS — Z0000 Encounter for general adult medical examination without abnormal findings: Secondary | ICD-10-CM

## 2022-02-16 MED ORDER — SEMAGLUTIDE (1 MG/DOSE) 4 MG/3ML ~~LOC~~ SOPN: 1.0000 mg | PEN_INJECTOR | SUBCUTANEOUS | 3 refills | Status: DC

## 2022-02-16 MED ORDER — METHYLPHENIDATE HCL ER (OSM) 36 MG PO TBCR: 36.0000 mg | EXTENDED_RELEASE_TABLET | Freq: Every day | ORAL | 0 refills | Status: DC

## 2022-02-16 NOTE — Assessment & Plan Note (Signed)
Continue with Ozempic.  Her latest weight is 290 pounds.  1 mg weekly Ozempic injections ?

## 2022-02-16 NOTE — Assessment & Plan Note (Signed)
Worse.  Obtain LH, FSH, TSH ?Try to add Rogaine for women foam to the regimen ?

## 2022-02-16 NOTE — Progress Notes (Signed)
Virtual Visit via Video Note ? ?I connected with Sylvia Alvarado on 02/16/22 at  8:50 AM EDT by a video enabled telemedicine application and verified that I am speaking with the correct person using two identifiers. ?  ?I discussed the limitations of evaluation and management by telemedicine and the availability of in person appointments. The patient expressed understanding and agreed to proceed. ? ?I was located at our [facility] office. ?The patient was at work. ?There was no one else present in the visit. ? ?No chief complaint on file. ?  ? ?History of Present Illness: ? ?C/o hair loss, ADD, hyperglycemia f/u. Sylvia Alvarado thinks she can take a higher dose of Concerta.  No side effects.  She is losing weight on Ozempic.  She wants to stay on 1 mg weekly injections.  Her latest weight was 290 pounds ? ?Review of Systems  ?Constitutional:  Negative for weight loss.  ?Neurological:  Negative for seizures.  ?Psychiatric/Behavioral:  Negative for memory loss, substance abuse and suicidal ideas. The patient is not nervous/anxious.   ? ? ?Observations/Objective: ?The patient appears to be in no acute distress ? ?Assessment and Plan: ? ?Problem List Items Addressed This Visit   ? ? Morbid obesity with BMI of 45.0-49.9, adult ([place])  ?  Continue with Ozempic.  Her latest weight is 290 pounds.  1 mg weekly Ozempic injections ?  ?  ? Relevant Medications  ? Semaglutide, 1 MG/DOSE, 4 MG/3ML SOPN  ? methylphenidate (CONCERTA) 36 MG PO CR tablet  ? Well adult exam - Primary  ? Relevant Orders  ? TSH  ? Urinalysis  ? CBC with Differential/Platelet  ? Lipid panel  ? Comprehensive metabolic panel  ? T4, free  ? Follicle stimulating hormone  ? Luteinizing hormone  ? Iron, TIBC and Ferritin Panel  ? Hemoglobin A1c  ? PMS (premenstrual syndrome)  ? Relevant Orders  ? T4, free  ? Follicle stimulating hormone  ? Luteinizing hormone  ? Iron, TIBC and Ferritin Panel  ? Hyperglycemia  ? Relevant Orders  ? Hemoglobin A1c  ? Brain fog  ?  Alopecia  ?  Worse.  Obtain LH, FSH, TSH ?Try to add Rogaine for women foam to the regimen ?  ?  ? ? ? ?Meds ordered this encounter  ?Medications  ? Semaglutide, 1 MG/DOSE, 4 MG/3ML SOPN  ?  Sig: Inject 1 mg as directed once a week.  ?  Dispense:  3 mL  ?  Refill:  3  ? methylphenidate (CONCERTA) 36 MG PO CR tablet  ?  Sig: Take 1 tablet (36 mg total) by mouth daily.  ?  Dispense:  30 tablet  ?  Refill:  0  ?  ? ?Follow Up Instructions: ? ?  ?I discussed the assessment and treatment plan with the patient. The patient was provided an opportunity to ask questions and all were answered. The patient agreed with the plan and demonstrated an understanding of the instructions. ?  ?The patient was advised to call back or seek an in-person evaluation if the symptoms worsen or if the condition fails to improve as anticipated. ? ?I provided face-to-face time during this encounter. We were at different locations. ? ? ?Walker Kehr, MD ? ?

## 2022-02-23 ENCOUNTER — Other Ambulatory Visit: Payer: Self-pay | Admitting: *Deleted

## 2022-02-23 NOTE — Telephone Encounter (Signed)
Rec'd PA for pt Methylphenidate '36mg'$ .. tried tro compete on cover my meds w/Key [id number]. Rec'd msg stating "  [employer] Prior Authorization Department is unable to review this request at this time. Our records indicate that this is not one of [employer]'s members. Please review the member's insurance demographics and submit to the appropriate party you in advance." Will review insurance w/ pt tomorrow..lmb ?

## 2022-02-27 ENCOUNTER — Other Ambulatory Visit: Payer: Self-pay | Admitting: *Deleted

## 2022-02-27 NOTE — Telephone Encounter (Signed)
Rec'd fax stating unable to locate patient not in our system.[name] Reach out to pt via mychart with information..Need insurance verified../;l,b ?

## 2022-02-27 NOTE — Telephone Encounter (Signed)
Rec'd PA for pt Methylphendiate 36 mg.. Completed vis cover-meds w/  (Key: [id number]). Rec'd msg has been sent to plan will recheck for approval status.Marland KitchenJohny Chess ?

## 2022-02-28 NOTE — Telephone Encounter (Signed)
Re'd fax from [employer] rx for pt PA. Completed and faxed back to [phone]. Waiting on insurance response.Marland KitchenJohny Alvarado ?

## 2022-02-28 NOTE — Telephone Encounter (Signed)
Rec'd fax from [employer] for PA on Methylphenidate. Completed fax and faxed back to [employer] @ [phone].Marland KitchenJohny Alvarado ?

## 2022-03-03 ENCOUNTER — Ambulatory Visit (INDEPENDENT_AMBULATORY_CARE_PROVIDER_SITE_OTHER): Payer: 59 | Admitting: Adult Health

## 2022-03-03 ENCOUNTER — Other Ambulatory Visit (HOSPITAL_COMMUNITY)
Admission: RE | Admit: 2022-03-03 | Discharge: 2022-03-03 | Disposition: A | Discharge status: Home / Self Care | Payer: 59 | Source: Ambulatory Visit | Attending: Adult Health | Admitting: Adult Health

## 2022-03-03 ENCOUNTER — Encounter: Payer: Self-pay | Admitting: Adult Health

## 2022-03-03 VITALS — BP 137/92 | HR 76 | Ht 67.0 in | Wt 298.0 lb

## 2022-03-03 DIAGNOSIS — Z01419 Encounter for gynecological examination (general) (routine) without abnormal findings: Secondary | ICD-10-CM

## 2022-03-03 DIAGNOSIS — Z1211 Encounter for screening for malignant neoplasm of colon: Secondary | ICD-10-CM | POA: Insufficient documentation

## 2022-03-03 LAB — HEMOCCULT GUIAC POC 1CARD (OFFICE): Fecal Occult Blood, POC: NEGATIVE

## 2022-03-03 NOTE — Telephone Encounter (Signed)
Rec'd determination on PA it statesn " Med approve is on pt formulary refills is payable on or after 03/15/22.Marland KitchenJohny Alvarado ?

## 2022-03-03 NOTE — Telephone Encounter (Signed)
Rec'd determination letter med is on formulary and per form was pick up 02/22/22.Marland KitchenJohny Chess ?

## 2022-03-03 NOTE — Progress Notes (Signed)
Patient ID: Sylvia Alvarado, female   DOB: [birth year], 51 y.o.   MRN: [id number] ?History of Present Illness: ?Sylvia Alvarado is a 51 year old white female, married, G1P1 in for well woman gyn exam and pap. 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  ?Lab Results  ?Component Value Date  ? DIAGPAP (A) 01/22/2020  ?  - Atypical squamous cells of undetermined significance (ASC-US)  ? HPV DETECTED (A) 12/11/2018  ? [place] Negative 01/22/2020  ?  ?PCP is Dr Alain Marion. ? ? ?Current Medications, Allergies, Past Medical History, Past Surgical History, Family History and Social History were reviewed in [employer] record.   ? ? ?Review of Systems: ?Patient denies any headaches, hearing loss, fatigue, blurred vision, shortness of breath, chest pain, abdominal pain, problems with bowel movements, urination, or intercourse. No joint pain or mood swings.  ?No bleeding had ablation. ? ? ?Physical Exam:BP (!) 137/92 (BP Location: Left Arm, Patient Position: Sitting, Cuff Size: Normal)   Pulse 76   Ht '5\' 7"'$  (1.702 m)   Wt 298 lb (135.2 kg)   LMP  (LMP Unknown)   BMI 46.67 kg/m?   ?General:  Well developed, well nourished, no acute distress ?Skin:  Warm and dry ?Neck:  Midline trachea, normal thyroid, good ROM, no lymphadenopathy ?Lungs; Clear to auscultation bilaterally ?Breast:  No dominant palpable mass, retraction, or nipple discharge,she is sp breast reduction and has bilateral irregular tissues ?Cardiovascular: Regular rate and rhythm ?Abdomen:  Soft, non tender, no hepatosplenomegaly ?Pelvic:  External genitalia is normal in appearance, no lesions.  The vagina is normal in appearance. Urethra has no lesions or masses. The cervix is smooth,pap with HR HPV genotyping performed.  Uterus is felt to be normal size, shape, and contour.  No adnexal masses or tenderness noted.Bladder is non tender, no masses felt. ?Rectal: Good sphincter tone, no polyps, or hemorrhoids felt.  Hemoccult  negative. ?Extremities/musculoskeletal:  No swelling or varicosities noted, no clubbing or cyanosis ?Psych:  No mood changes, alert and cooperative,seems happy ?AA is 1 ?Fall risk is low ? ?  03/03/2022  ?  9:37 AM 03/16/2021  ?  8:13 AM 01/22/2020  ?  9:49 AM  ?Depression screen PHQ 2/9  ?Decreased Interest 0 1 0  ?Down, Depressed, Hopeless 0 2 0  ?PHQ - 2 Score 0 3 0  ?Altered sleeping 0 2 1  ?Tired, decreased energy 0 1 0  ?Change in appetite 1 0 0  ?Feeling bad or failure about yourself  0 0 2  ?Trouble concentrating 1 2 0  ?Moving slowly or fidgety/restless 0 0 0  ?Suicidal thoughts 0 0 0  ?PHQ-9 Score '2 8 3  '$ ?Difficult doing work/chores  Somewhat difficult Not difficult at all  ?  ? ?  03/03/2022  ?  9:37 AM  ?GAD 7 : Generalized Anxiety Score  ?Nervous, Anxious, on Edge 1  ?Control/stop worrying 0  ?Worry too much - different things 0  ?Trouble relaxing 0  ?Restless 0  ?Easily annoyed or irritable 0  ?Afraid - awful might happen 0  ?Total GAD 7 Score 1  ? ? [place] - 03/03/22 0937   ? ?  ? Pregnancy Intention Screening  ? Does the patient want to become pregnant in the next year? N/A   ? Does the patient's partner want to become pregnant in the next year? N/A   ? Would the patient like to discuss contraceptive options today? N/A   ?  ? Contraception Wrap  Up  ? Current Method Female Sterilization   ? End Method Female Sterilization   ? Contraception Counseling Provided No   ? ?  ?  ? ?  ? Examination chaperoned by Marcelino Scot RN ?  ? ?Impression and Plan: ?1. Encounter for gynecological examination with Papanicolaou smear of cervix ?Pap sent ?Physical in 1 year ?Pap in 3 years if normal  ?Labs with PCP ?Get mammogram ?Talk with PCP about cologuard vs colonoscopy at next visit ?She sees PCP every 3 months just started on ADD meds. ? ?2. Encounter for screening fecal occult blood testing ?Hemoccult was negative ? ? ? ? ?  ?  ?

## 2022-03-09 LAB — CYTOLOGY - PAP
Comment: NEGATIVE
Diagnosis: UNDETERMINED — AB
High risk HPV: NEGATIVE

## 2022-03-16 ENCOUNTER — Other Ambulatory Visit: Payer: Self-pay | Admitting: Internal Medicine

## 2022-03-16 NOTE — Telephone Encounter (Signed)
Check [place] registry last filled 02/24/2022../lmb  

## 2022-03-17 ENCOUNTER — Other Ambulatory Visit: Payer: Self-pay | Admitting: Internal Medicine

## 2022-03-18 LAB — URINALYSIS, ROUTINE W REFLEX MICROSCOPIC
Bilirubin, UA: NEGATIVE
Glucose, UA: NEGATIVE
Ketones, UA: NEGATIVE
Leukocytes,UA: NEGATIVE
Nitrite, UA: NEGATIVE
Protein,UA: NEGATIVE
RBC, UA: NEGATIVE
Specific Gravity, UA: <=1.005 — AB (ref 1.005–1.030)
Urobilinogen, Ur: 0.2 mg/dL (ref 0.2–1.0)
pH, UA: 7.5 (ref 5.0–7.5)

## 2022-03-18 LAB — COMPREHENSIVE METABOLIC PANEL
ALT: 33 IU/L — ABNORMAL HIGH (ref 0–32)
AST: 34 IU/L (ref 0–40)
Albumin/Globulin Ratio: 1.6 (ref 1.2–2.2)
Albumin: 4.2 g/dL (ref 3.8–4.8)
Alkaline Phosphatase: 55 IU/L (ref 44–121)
BUN/Creatinine Ratio: 15 (ref 9–23)
BUN: 12 mg/dL (ref 6–24)
Bilirubin Total: 0.3 mg/dL (ref 0.0–1.2)
CO2: 22 mmol/L (ref 20–29)
Calcium: 9.5 mg/dL (ref 8.7–10.2)
Chloride: 101 mmol/L (ref 96–106)
Creatinine, Ser: 0.81 mg/dL (ref 0.57–1.00)
Globulin, Total: 2.6 g/dL (ref 1.5–4.5)
Glucose: 99 mg/dL (ref 70–99)
Potassium: 4.5 mmol/L (ref 3.5–5.2)
Sodium: 139 mmol/L (ref 134–144)
Total Protein: 6.8 g/dL (ref 6.0–8.5)
eGFR: 89 mL/min/{1.73_m2} (ref >59)

## 2022-03-18 LAB — HGB A1C W/O EAG: Hgb A1c MFr Bld: 5.6 % (ref 4.8–5.6)

## 2022-03-18 LAB — IRON AND TIBC
Iron Saturation: 17 % (ref 15–55)
Iron: 68 ug/dL (ref 27–159)
Total Iron Binding Capacity: 397 ug/dL (ref 250–450)
UIBC: 329 ug/dL (ref 131–425)

## 2022-03-18 LAB — TSH: TSH: 0.925 u[IU]/mL (ref 0.450–4.500)

## 2022-03-18 LAB — T4, FREE: Free T4: 1.24 ng/dL (ref 0.82–1.77)

## 2022-03-18 LAB — FERRITIN: Ferritin: 23 ng/mL (ref 15–150)

## 2022-03-18 LAB — FOLLICLE STIMULATING HORMONE: FSH: 38.8 m[IU]/mL

## 2022-03-18 LAB — LUTEINIZING HORMONE: LH: 22.9 m[IU]/mL

## 2022-03-21 LAB — LIPID PANEL W/O CHOL/HDL RATIO
Cholesterol, Total: 194 mg/dL (ref 100–199)
HDL: 57 mg/dL (ref >39)
LDL Chol Calc (NIH): 94 mg/dL (ref 0–99)
Triglycerides: 256 mg/dL — ABNORMAL HIGH (ref 0–149)
VLDL Cholesterol Cal: 43 mg/dL — ABNORMAL HIGH (ref 5–40)

## 2022-03-21 LAB — SPECIMEN STATUS REPORT

## 2022-03-23 ENCOUNTER — Telehealth: Payer: Self-pay | Admitting: Internal Medicine

## 2022-03-23 ENCOUNTER — Ambulatory Visit (INDEPENDENT_AMBULATORY_CARE_PROVIDER_SITE_OTHER): Payer: 59 | Admitting: Internal Medicine

## 2022-03-23 ENCOUNTER — Encounter: Payer: Self-pay | Admitting: Internal Medicine

## 2022-03-23 DIAGNOSIS — J4521 Mild intermittent asthma with (acute) exacerbation: Secondary | ICD-10-CM | POA: Diagnosis not present

## 2022-03-23 DIAGNOSIS — R5382 Chronic fatigue, unspecified: Secondary | ICD-10-CM | POA: Diagnosis not present

## 2022-03-23 DIAGNOSIS — Z6841 Body Mass Index (BMI) 40.0 and over, adult: Secondary | ICD-10-CM

## 2022-03-23 DIAGNOSIS — R4189 Other symptoms and signs involving cognitive functions and awareness: Secondary | ICD-10-CM

## 2022-03-23 MED ORDER — SEMAGLUTIDE (2 MG/DOSE) 8 MG/3ML ~~LOC~~ SOPN: 2.0000 mg | PEN_INJECTOR | SUBCUTANEOUS | 3 refills | Status: DC

## 2022-03-23 MED ORDER — METFORMIN HCL 500 MG PO TABS: 500.0000 mg | ORAL_TABLET | Freq: Every day | ORAL | 3 refills | Status: DC

## 2022-03-23 MED ORDER — METHYLPHENIDATE HCL ER (OSM) 36 MG PO TBCR
36.0000 mg | EXTENDED_RELEASE_TABLET | Freq: Every day | ORAL | 0 refills | Status: DC
Start: 2022-03-23 — End: 2022-04-25

## 2022-03-23 NOTE — Assessment & Plan Note (Signed)
Increase Ozempic ?Will try Adderall low dose ? Potential benefits of a long term amphetamines  use as well as potential risks  and complications were explained to the patient and were aknowledged. ?

## 2022-03-23 NOTE — Progress Notes (Signed)
? ?Subjective:  ?Patient ID: Sylvia Alvarado, female    DOB: December 25, 1970  Age: 51 y.o. MRN: [id number] ? ?CC: No chief complaint on file. ? ? ?HPI ?Sylvia Alvarado presents for DM, asthma, obesity ? ?Outpatient Medications Prior to Visit  ?Medication Sig Dispense Refill  ? albuterol (VENTOLIN HFA) 108 (90 Base) MCG/ACT inhaler Inhale 2 puffs into the lungs every 6 (six) hours as needed for wheezing or shortness of breath. 18 g 11  ? Cholecalciferol (VITAMIN D3) 50 MCG (2000 UT) capsule Take 1 capsule (2,000 Units total) by mouth daily. 100 capsule 3  ? clonazePAM (KLONOPIN) 1 MG tablet TAKE 1 TABLET BY MOUTH TWICE DAILY AS NEEDED 60 tablet 1  ? Cyanocobalamin (VITAMIN B-12) 500 MCG SUBL 1 sl qd (Patient taking differently: Place 500 mcg under the tongue daily.) 100 tablet 3  ? cyclobenzaprine (FLEXERIL) 10 MG tablet TAKE 1 TABLET BY MOUTH TWICE DAILY 60 tablet 3  ? diclofenac sodium (VOLTAREN) 1 % GEL APPLY 2-4 GRAMS TO AFFECTED AREA TWICE AS DIRECTED AS NEEDED. 200 g 0  ? DULoxetine (CYMBALTA) 30 MG capsule TAKE ONE CAPSULE BY MOUTH DAILY 30 capsule 5  ? fluticasone (FLONASE) 50 MCG/ACT nasal spray PLACE TWO SPRAYS INTO BOTH NOSTRILS DAILY 16 g 6  ? fluticasone-salmeterol (ADVAIR) 100-50 MCG/ACT AEPB Inhale 1 puff into the lungs 2 (two) times daily. 1 each 11  ? furosemide (LASIX) 20 MG tablet 1po prn swelling 30 tablet 3  ? meloxicam (MOBIC) 15 MG tablet TAKE 1 TABLET BY MOUTH EVERY DAY 30 tablet 2  ? metoprolol succinate (TOPROL-XL) 25 MG 24 hr tablet TAKE 1 TABLET BY MOUTH EVERY DAY 90 tablet 3  ? pantoprazole (PROTONIX) 40 MG tablet Take 1 tablet (40 mg total) by mouth daily. 90 tablet 1  ? triamcinolone cream (KENALOG) 0.1 % APPLY TO THE AFFECTED AREA(S) FOUR TIMES DAILY 80 g 0  ? metFORMIN (GLUCOPHAGE) 500 MG tablet Take 1 tablet (500 mg total) by mouth in the morning, at noon, and at bedtime. TAKE 1 TABLET BY MOUTH twice a day 270 tablet 3  ? methylphenidate (CONCERTA) 36 MG PO CR tablet Take 1 tablet (36 mg  total) by mouth daily. 30 tablet 0  ? Semaglutide, 1 MG/DOSE, 4 MG/3ML SOPN Inject 1 mg as directed once a week. 3 mL 3  ? ?No facility-administered medications prior to visit.  ? ? ?ROS: ?Review of Systems  ?Constitutional:  Positive for fatigue. Negative for activity change, appetite change, chills and unexpected weight change.  ?HENT:  Negative for congestion, mouth sores and sinus pressure.   ?Eyes:  Negative for visual disturbance.  ?Respiratory:  Negative for cough and chest tightness.   ?Gastrointestinal:  Negative for abdominal pain and nausea.  ?Genitourinary:  Negative for difficulty urinating, frequency and vaginal pain.  ?Musculoskeletal:  Negative for back pain and gait problem.  ?Skin:  Negative for pallor and rash.  ?Neurological:  Negative for dizziness, tremors, weakness, numbness and headaches.  ?Psychiatric/Behavioral:  Negative for confusion, sleep disturbance and suicidal ideas.   ? ?Objective:  ?BP 122/80 (BP Location: Left Arm, Patient Position: Sitting, Cuff Size: Normal)   Pulse 77   Temp 97.9 ?F (36.6 ?C) (Oral)   Ht '5\' 7"'$  (1.702 m)   Wt 297 lb (134.7 kg)   LMP  (LMP Unknown)   SpO2 98%   BMI 46.52 kg/m?  ? ?BP Readings from Last 3 Encounters:  ?03/23/22 122/80  ?03/03/22 (!) 137/92  ?11/17/21 132/82  ? ? ?  Wt Readings from Last 3 Encounters:  ?03/23/22 297 lb (134.7 kg)  ?03/03/22 298 lb (135.2 kg)  ?02/20/22 295 lb (133.8 kg)  ? ? ?Physical Exam ?Constitutional:   ?   General: She is not in acute distress. ?   Appearance: She is well-developed. She is obese.  ?HENT:  ?   Head: Normocephalic.  ?   Right Ear: External ear normal.  ?   Left Ear: External ear normal.  ?   Nose: Nose normal.  ?Eyes:  ?   General:     ?   Right eye: No discharge.     ?   Left eye: No discharge.  ?   Conjunctiva/sclera: Conjunctivae normal.  ?   Pupils: Pupils are equal, round, and reactive to light.  ?Neck:  ?   Thyroid: No thyromegaly.  ?   Vascular: No JVD.  ?   Trachea: No tracheal deviation.   ?Cardiovascular:  ?   Rate and Rhythm: Normal rate and regular rhythm.  ?   Heart sounds: Normal heart sounds.  ?Pulmonary:  ?   Effort: No respiratory distress.  ?   Breath sounds: No stridor. No wheezing.  ?Abdominal:  ?   General: Bowel sounds are normal. There is no distension.  ?   Palpations: Abdomen is soft. There is no mass.  ?   Tenderness: There is no abdominal tenderness. There is no guarding or rebound.  ?Musculoskeletal:     ?   General: No tenderness.  ?   Cervical back: Normal range of motion and neck supple. No rigidity.  ?Lymphadenopathy:  ?   Cervical: No cervical adenopathy.  ?Skin: ?   Findings: No erythema or rash.  ?Neurological:  ?   Mental Status: She is oriented to person, place, and time.  ?   Cranial Nerves: No cranial nerve deficit.  ?   Motor: No abnormal muscle tone.  ?   Coordination: Coordination normal.  ?   Deep Tendon Reflexes: Reflexes normal.  ?Psychiatric:     ?   Behavior: Behavior normal.     ?   Thought Content: Thought content normal.     ?   Judgment: Judgment normal.  ?Occip area is tender B ? ?Lab Results  ?Component Value Date  ? WBC 7.9 03/16/2021  ? HGB 13.2 03/16/2021  ? HCT 40.0 03/16/2021  ? PLT 226.0 03/16/2021  ? GLUCOSE 99 03/17/2022  ? CHOL 194 03/17/2022  ? TRIG 256 (H) 03/17/2022  ? HDL 57 03/17/2022  ? LDLDIRECT 135.0 03/18/2018  ? [place] 94 03/17/2022  ? ALT 33 (H) 03/17/2022  ? AST 34 03/17/2022  ? NA 139 03/17/2022  ? K 4.5 03/17/2022  ? CL 101 03/17/2022  ? CREATININE 0.81 03/17/2022  ? BUN 12 03/17/2022  ? CO2 22 03/17/2022  ? TSH 0.925 03/17/2022  ? HGBA1C 5.6 03/17/2022  ? MICROALBUR <0.7 03/16/2021  ? ? ?No results found. ? ?Assessment & Plan:  ? ?Problem List Items Addressed This Visit   ? ? Morbid obesity with BMI of 45.0-49.9, adult ([place])  ?  Increase Ozempic ?Will try Adderall low dose ? Potential benefits of a long term amphetamines  use as well as potential risks  and complications were explained to the patient and were aknowledged. ?  ?  ?  Relevant Medications  ? methylphenidate (CONCERTA) 36 MG PO CR tablet  ? Semaglutide, 2 MG/DOSE, 8 MG/3ML SOPN  ? metFORMIN (GLUCOPHAGE) 500 MG tablet  ? Asthma  ?  Cont Proair prn ?  ?  ? Chronic fatigue  ?  Fatigue  ?Will try Adderall low dose ? Potential benefits of a long term amphetamines  use as well as potential risks  and complications were explained to the patient and were aknowledged. ?  ?  ? Brain fog  ?  Probable ADD.  Continue with Concerta.  Increase the dose to 36 mg in the morning ?  ?  ?  ? ? ?Meds ordered this encounter  ?Medications  ? methylphenidate (CONCERTA) 36 MG PO CR tablet  ?  Sig: Take 1 tablet (36 mg total) by mouth daily.  ?  Dispense:  30 tablet  ?  Refill:  0  ? Semaglutide, 2 MG/DOSE, 8 MG/3ML SOPN  ?  Sig: Inject 2 mg as directed once a week.  ?  Dispense:  3 mL  ?  Refill:  3  ? metFORMIN (GLUCOPHAGE) 500 MG tablet  ?  Sig: Take 1 tablet (500 mg total) by mouth daily with breakfast. TAKE 1 TABLET BY MOUTH twice a day  ?  Dispense:  90 tablet  ?  Refill:  3  ?  ? ? ?Follow-up: Return in about 3 months (around 06/23/2022) for a follow-up visit. ? ?Walker Kehr, MD ?

## 2022-03-23 NOTE — Telephone Encounter (Signed)
[facility] called for clarification on a rx we sent for pt this morning. [facility]age instructions unclear 'Take 1 tablet (500 mg total) by mouth daily with breakfast. TAKE 1 TABLET BY MOUTH twice a day'  ? ?Please clarify and let the pharmacy know which dosage instruction is needed ?

## 2022-03-23 NOTE — Assessment & Plan Note (Signed)
Cont Proair prn ?

## 2022-03-23 NOTE — Assessment & Plan Note (Signed)
Probable ADD.  Continue with Concerta.  Increase the dose to 36 mg in the morning ?

## 2022-03-23 NOTE — Assessment & Plan Note (Signed)
Fatigue  ?Will try Adderall low dose ? Potential benefits of a long term amphetamines  use as well as potential risks  and complications were explained to the patient and were aknowledged. ?

## 2022-03-24 MED ORDER — METFORMIN HCL 500 MG PO TABS: 500.0000 mg | ORAL_TABLET | Freq: Every day | ORAL | 3 refills | Status: DC

## 2022-03-24 NOTE — Telephone Encounter (Signed)
Spoke with pharm tech at [facility] and was able to clarify directions. ?

## 2022-03-24 NOTE — Telephone Encounter (Signed)
Once a day is correct.  Corrected.  Thanks ?

## 2022-03-31 ENCOUNTER — Encounter: Payer: Self-pay | Admitting: Internal Medicine

## 2022-03-31 ENCOUNTER — Other Ambulatory Visit: Payer: Self-pay | Admitting: Internal Medicine

## 2022-03-31 DIAGNOSIS — Z1231 Encounter for screening mammogram for malignant neoplasm of breast: Secondary | ICD-10-CM

## 2022-04-03 ENCOUNTER — Other Ambulatory Visit: Payer: Self-pay | Admitting: Internal Medicine

## 2022-04-05 ENCOUNTER — Ambulatory Visit (INDEPENDENT_AMBULATORY_CARE_PROVIDER_SITE_OTHER): Payer: 59

## 2022-04-05 DIAGNOSIS — Z1231 Encounter for screening mammogram for malignant neoplasm of breast: Secondary | ICD-10-CM | POA: Diagnosis not present

## 2022-04-13 ENCOUNTER — Other Ambulatory Visit: Payer: Self-pay | Admitting: Internal Medicine

## 2022-04-19 ENCOUNTER — Other Ambulatory Visit: Payer: Self-pay | Admitting: Internal Medicine

## 2022-04-24 ENCOUNTER — Other Ambulatory Visit: Payer: Self-pay | Admitting: Internal Medicine

## 2022-04-24 DIAGNOSIS — Z1231 Encounter for screening mammogram for malignant neoplasm of breast: Secondary | ICD-10-CM

## 2022-04-25 ENCOUNTER — Encounter: Payer: Self-pay | Admitting: Internal Medicine

## 2022-04-25 ENCOUNTER — Telehealth (INDEPENDENT_AMBULATORY_CARE_PROVIDER_SITE_OTHER): Payer: 59 | Admitting: Internal Medicine

## 2022-04-25 DIAGNOSIS — R4189 Other symptoms and signs involving cognitive functions and awareness: Secondary | ICD-10-CM | POA: Diagnosis not present

## 2022-04-25 MED ORDER — DEXMETHYLPHENIDATE HCL ER 15 MG PO CP24: 15.0000 mg | ORAL_CAPSULE | Freq: Every day | ORAL | 0 refills | Status: DC

## 2022-04-25 NOTE — Progress Notes (Signed)
Virtual Visit via Video Note  I connected with Sylvia Alvarado on 04/25/22 at  4:00 PM EDT by a video enabled telemedicine application and verified that I am speaking with the correct person using two identifiers.   I discussed the limitations of evaluation and management by telemedicine and the availability of in person appointments. The patient expressed understanding and agreed to proceed.  I was located at our [facility] office. The patient was at home. There was no one else present in the visit.  No chief complaint on file.    History of Present Illness:  F/u on brain fog/ADD, Concerta is n/a any longer.Marland KitchenMarland KitchenFocalin XR is covered.  ROS Doing well overall  Observations/Objective: The patient appears to be in no acute distress  Assessment and Plan:  Problem List Items Addressed This Visit     Brain fog    Concerta is n/a Start Focalin XR 15 mg qam instead  Potential benefits of a long term amphetamines  use as well as potential risks  and complications were explained to the patient and were aknowledged.         Meds ordered this encounter  Medications   dexmethylphenidate (FOCALIN XR) 15 MG 24 hr capsule    Sig: Take 1 capsule (15 mg total) by mouth daily.    Dispense:  30 capsule    Refill:  0    Please fill on or after 04/26/22     Follow Up Instructions:    I discussed the assessment and treatment plan with the patient. The patient was provided an opportunity to ask questions and all were answered. The patient agreed with the plan and demonstrated an understanding of the instructions.   The patient was advised to call back or seek an in-person evaluation if the symptoms worsen or if the condition fails to improve as anticipated.  I provided face-to-face time during this encounter. We were at different locations.   Walker Kehr, MD

## 2022-04-25 NOTE — Assessment & Plan Note (Signed)
Concerta is n/a Start Focalin XR 15 mg qam instead  Potential benefits of a long term amphetamines  use as well as potential risks  and complications were explained to the patient and were aknowledged.

## 2022-05-10 ENCOUNTER — Other Ambulatory Visit: Payer: Self-pay | Admitting: Internal Medicine

## 2022-05-10 NOTE — Telephone Encounter (Signed)
Check [place] registry last filled 04/20/2022.Marland KitchenJohny Alvarado

## 2022-05-17 ENCOUNTER — Other Ambulatory Visit: Payer: Self-pay | Admitting: Internal Medicine

## 2022-05-18 ENCOUNTER — Other Ambulatory Visit: Payer: Self-pay | Admitting: *Deleted

## 2022-05-18 MED ORDER — FUROSEMIDE 20 MG PO TABS: 20.0000 mg | ORAL_TABLET | Freq: Every day | ORAL | 3 refills | Status: AC | PRN

## 2022-06-05 ENCOUNTER — Encounter: Payer: Self-pay | Admitting: Internal Medicine

## 2022-06-05 NOTE — Telephone Encounter (Signed)
Noted../lb

## 2022-06-12 ENCOUNTER — Other Ambulatory Visit: Payer: Self-pay | Admitting: Internal Medicine

## 2022-06-15 ENCOUNTER — Other Ambulatory Visit: Payer: Self-pay | Admitting: Internal Medicine

## 2022-06-26 ENCOUNTER — Other Ambulatory Visit: Payer: Self-pay | Admitting: Internal Medicine

## 2022-06-26 NOTE — Telephone Encounter (Signed)
Check [place] registry last filled 05/29/2022.Marland KitchenJohny Chess

## 2022-06-28 ENCOUNTER — Other Ambulatory Visit: Payer: Self-pay | Admitting: Internal Medicine

## 2022-06-28 NOTE — Telephone Encounter (Signed)
DUPLICATE REQUEST. ON md DESK FOR APPROVAL

## 2022-06-28 NOTE — Telephone Encounter (Signed)
Pt sent another request wanting Focalin filled.. On MD desktop.Marland KitchenJohny Alvarado

## 2022-06-29 ENCOUNTER — Telehealth: Payer: 59 | Admitting: Internal Medicine

## 2022-06-29 NOTE — Telephone Encounter (Signed)
Pt is calling for an update to the medication refill request.   Please advise

## 2022-06-30 ENCOUNTER — Other Ambulatory Visit: Payer: Self-pay | Admitting: Internal Medicine

## 2022-06-30 NOTE — Telephone Encounter (Signed)
Done. Thanks.

## 2022-07-05 ENCOUNTER — Other Ambulatory Visit: Payer: Self-pay | Admitting: Internal Medicine

## 2022-07-06 ENCOUNTER — Other Ambulatory Visit: Payer: Self-pay | Admitting: Internal Medicine

## 2022-07-06 LAB — COMPREHENSIVE METABOLIC PANEL
ALT: 24 IU/L (ref 0–32)
AST: 24 IU/L (ref 0–40)
Albumin/Globulin Ratio: 1.6 (ref 1.2–2.2)
Albumin: 4.3 g/dL (ref 3.9–4.9)
Alkaline Phosphatase: 57 IU/L (ref 44–121)
BUN/Creatinine Ratio: 21 (ref 9–23)
BUN: 16 mg/dL (ref 6–24)
Bilirubin Total: 0.2 mg/dL (ref 0.0–1.2)
CO2: 24 mmol/L (ref 20–29)
Calcium: 9.8 mg/dL (ref 8.7–10.2)
Chloride: 101 mmol/L (ref 96–106)
Creatinine, Ser: 0.75 mg/dL (ref 0.57–1.00)
Globulin, Total: 2.7 g/dL (ref 1.5–4.5)
Glucose: 94 mg/dL (ref 70–99)
Potassium: 4.6 mmol/L (ref 3.5–5.2)
Sodium: 138 mmol/L (ref 134–144)
Total Protein: 7 g/dL (ref 6.0–8.5)
eGFR: 98 mL/min/{1.73_m2} (ref >59)

## 2022-07-06 LAB — HGB A1C W/O EAG: Hgb A1c MFr Bld: 5.7 % — ABNORMAL HIGH (ref 4.8–5.6)

## 2022-07-06 NOTE — Telephone Encounter (Signed)
Check [place] registry last filled 06/16/2022.Marland KitchenJohny Chess

## 2022-07-07 ENCOUNTER — Encounter: Payer: Self-pay | Admitting: Internal Medicine

## 2022-07-13 ENCOUNTER — Telehealth (INDEPENDENT_AMBULATORY_CARE_PROVIDER_SITE_OTHER): Payer: 59 | Admitting: Internal Medicine

## 2022-07-13 ENCOUNTER — Encounter: Payer: Self-pay | Admitting: Internal Medicine

## 2022-07-13 DIAGNOSIS — F4321 Adjustment disorder with depressed mood: Secondary | ICD-10-CM

## 2022-07-13 DIAGNOSIS — E1169 Type 2 diabetes mellitus with other specified complication: Secondary | ICD-10-CM | POA: Diagnosis not present

## 2022-07-13 DIAGNOSIS — Z6841 Body Mass Index (BMI) 40.0 and over, adult: Secondary | ICD-10-CM

## 2022-07-13 DIAGNOSIS — R4189 Other symptoms and signs involving cognitive functions and awareness: Secondary | ICD-10-CM

## 2022-07-13 DIAGNOSIS — E669 Obesity, unspecified: Secondary | ICD-10-CM

## 2022-07-13 MED ORDER — DEXMETHYLPHENIDATE HCL ER 15 MG PO CP24: 15.0000 mg | ORAL_CAPSULE | Freq: Every day | ORAL | 0 refills | Status: DC

## 2022-07-13 NOTE — Assessment & Plan Note (Signed)
Possibly bipolar depression Cymbalta Clonazepam prn  Potential benefits of a long term benzodiazepines  use as well as potential risks  and complications were explained to the patient and were aknowledged.

## 2022-07-13 NOTE — Assessment & Plan Note (Signed)
Wt is 290 lbs now Cont w/diet

## 2022-07-13 NOTE — Progress Notes (Signed)
Virtual Visit via Video Note  I connected with Sylvia Alvarado on 07/13/22 at  3:40 PM EDT by a video enabled telemedicine application and verified that I am speaking with the correct person using two identifiers.   I discussed the limitations of evaluation and management by telemedicine and the availability of in person appointments. The patient expressed understanding and agreed to proceed.  I was located at our [facility] office. The patient was at home. There was no one else present in the visit.  No chief complaint on file.    History of Present Illness:  for brain fog, DM, HTN Review of Systems  Eyes:  Negative for double vision.  Cardiovascular:  Negative for leg swelling.  Neurological:  Negative for loss of consciousness.  Psychiatric/Behavioral:  Negative for depression, memory loss and substance abuse. The patient is not nervous/anxious.      Observations/Objective: The patient appears to be in no acute distress  Assessment and Plan: Pt looks well  Problem List Items Addressed This Visit     Brain fog    Cont w/ Focalin XR 15 mg qam   Potential benefits of a long term amphetamines  use as well as potential risks  and complications were explained to the patient and were aknowledged.      Diabetes mellitus type 2 in obese ([place])    On Ozempic - too $$$ Cont Metformin RTC 3 mo      Morbid obesity with BMI of 45.0-49.9, adult ([facility])    Wt is 290 lbs now Cont w/diet      Relevant Medications   dexmethylphenidate (FOCALIN XR) 15 MG 24 hr capsule   dexmethylphenidate (FOCALIN XR) 15 MG 24 hr capsule   dexmethylphenidate (FOCALIN XR) 15 MG 24 hr capsule   Situational depression    Possibly bipolar depression Cymbalta Clonazepam prn  Potential benefits of a long term benzodiazepines  use as well as potential risks  and complications were explained to the patient and were aknowledged.        Meds ordered this encounter  Medications   dexmethylphenidate  (FOCALIN XR) 15 MG 24 hr capsule    Sig: Take 1 capsule (15 mg total) by mouth daily.    Dispense:  30 capsule    Refill:  0    Please fill on or after 09/10/22   dexmethylphenidate (FOCALIN XR) 15 MG 24 hr capsule    Sig: Take 1 capsule (15 mg total) by mouth daily.    Dispense:  30 capsule    Refill:  0    Please fill on or after 08/11/22   dexmethylphenidate (FOCALIN XR) 15 MG 24 hr capsule    Sig: Take 1 capsule (15 mg total) by mouth daily.    Dispense:  30 capsule    Refill:  0    Please fill on or after 07/13/22     Follow Up Instructions:    I discussed the assessment and treatment plan with the patient. The patient was provided an opportunity to ask questions and all were answered. The patient agreed with the plan and demonstrated an understanding of the instructions.   The patient was advised to call back or seek an in-person evaluation if the symptoms worsen or if the condition fails to improve as anticipated.  I provided face-to-face time during this encounter. We were at different locations.   Walker Kehr, MD

## 2022-07-13 NOTE — Assessment & Plan Note (Signed)
Cont w/ Focalin XR 15 mg qam   Potential benefits of a long term amphetamines  use as well as potential risks  and complications were explained to the patient and were aknowledged.

## 2022-07-13 NOTE — Assessment & Plan Note (Signed)
On Ozempic - too $$$ Cont Metformin RTC 3 mo

## 2022-07-26 DIAGNOSIS — Z23 Encounter for immunization: Secondary | ICD-10-CM | POA: Diagnosis not present

## 2022-07-28 ENCOUNTER — Encounter: Payer: Self-pay | Admitting: Internal Medicine

## 2022-08-04 ENCOUNTER — Other Ambulatory Visit: Payer: Self-pay | Admitting: Internal Medicine

## 2022-08-04 ENCOUNTER — Telehealth: Payer: Self-pay | Admitting: *Deleted

## 2022-08-04 MED ORDER — RYBELSUS 3 MG PO TABS: 3.0000 mg | ORAL_TABLET | Freq: Every day | ORAL | 3 refills | Status: DC

## 2022-08-04 NOTE — Telephone Encounter (Signed)
Pt need PA on Rybelsus 3 mg submitted w/ (Key: [id number]). Rec'd msg Your PA request has been sent to [employer].Marland KitchenJohny Chess

## 2022-08-08 NOTE — Telephone Encounter (Signed)
Rec'd determination med was DENIED It states med not on formulary. Alternative are e Trulicity; Victoza. Requirement: 3 in a class with 3 or more alternatives, 2 in a class with 2 alternatives, or 1 in a class with only 1 alternative. Please refer to your plan documents for a complete list of alternatives...Sylvia Alvarado

## 2022-08-09 NOTE — Telephone Encounter (Signed)
Sent pt msg via mychart.Marland KitchenJohny Alvarado

## 2022-08-09 NOTE — Telephone Encounter (Signed)
We can switch to Victoza injection if the patient is in agreement.  Thank you

## 2022-08-29 ENCOUNTER — Other Ambulatory Visit: Payer: Self-pay | Admitting: Internal Medicine

## 2022-08-31 ENCOUNTER — Encounter: Payer: Self-pay | Admitting: Internal Medicine

## 2022-08-31 ENCOUNTER — Other Ambulatory Visit: Payer: Self-pay | Admitting: Internal Medicine

## 2022-09-04 MED ORDER — DEXMETHYLPHENIDATE HCL ER 15 MG PO CP24: 15.0000 mg | ORAL_CAPSULE | Freq: Every day | ORAL | 0 refills | Status: DC

## 2022-09-05 ENCOUNTER — Other Ambulatory Visit: Payer: Self-pay | Admitting: Internal Medicine

## 2022-09-17 ENCOUNTER — Encounter: Payer: Self-pay | Admitting: Internal Medicine

## 2022-09-26 ENCOUNTER — Other Ambulatory Visit: Payer: Self-pay | Admitting: Internal Medicine

## 2022-09-27 MED ORDER — DEXMETHYLPHENIDATE HCL ER 35 MG PO CP24: 35.0000 mg | ORAL_CAPSULE | ORAL | 0 refills | Status: DC

## 2022-10-01 ENCOUNTER — Other Ambulatory Visit: Payer: Self-pay | Admitting: Internal Medicine

## 2022-10-01 MED ORDER — DEXMETHYLPHENIDATE HCL ER 35 MG PO CP24: 35.0000 mg | ORAL_CAPSULE | ORAL | 0 refills | Status: DC

## 2022-10-11 ENCOUNTER — Ambulatory Visit: Payer: 59 | Admitting: Internal Medicine

## 2022-10-11 ENCOUNTER — Encounter: Payer: Self-pay | Admitting: Internal Medicine

## 2022-10-11 VITALS — BP 124/78 | HR 76 | Temp 98.1°F | Ht 67.0 in | Wt 292.0 lb

## 2022-10-11 DIAGNOSIS — M25512 Pain in left shoulder: Secondary | ICD-10-CM | POA: Diagnosis not present

## 2022-10-11 DIAGNOSIS — F4321 Adjustment disorder with depressed mood: Secondary | ICD-10-CM | POA: Diagnosis not present

## 2022-10-11 DIAGNOSIS — R4189 Other symptoms and signs involving cognitive functions and awareness: Secondary | ICD-10-CM

## 2022-10-11 DIAGNOSIS — M25519 Pain in unspecified shoulder: Secondary | ICD-10-CM | POA: Insufficient documentation

## 2022-10-11 DIAGNOSIS — M25511 Pain in right shoulder: Secondary | ICD-10-CM | POA: Diagnosis not present

## 2022-10-11 DIAGNOSIS — E669 Obesity, unspecified: Secondary | ICD-10-CM

## 2022-10-11 DIAGNOSIS — E1169 Type 2 diabetes mellitus with other specified complication: Secondary | ICD-10-CM | POA: Diagnosis not present

## 2022-10-11 DIAGNOSIS — R69 Illness, unspecified: Secondary | ICD-10-CM | POA: Diagnosis not present

## 2022-10-11 MED ORDER — TRULICITY 0.75 MG/0.5ML ~~LOC~~ SOAJ: 0.7500 mg | SUBCUTANEOUS | 3 refills | Status: DC

## 2022-10-11 NOTE — Assessment & Plan Note (Signed)
Start Trulicity weekly

## 2022-10-11 NOTE — Assessment & Plan Note (Signed)
Heat NSAIDs Voltaren gel

## 2022-10-11 NOTE — Assessment & Plan Note (Signed)
Better on Focalin XR 35 mg qam   Potential benefits of a long term amphetamines  use as well as potential risks  and complications were explained to the patient and were aknowledged.

## 2022-10-11 NOTE — Assessment & Plan Note (Signed)
Cymbalta Clonazepam prn  Potential benefits of a long term benzodiazepines  use as well as potential risks  and complications were explained to the patient and were aknowledged.

## 2022-10-11 NOTE — Progress Notes (Signed)
Subjective:  Patient ID: Sylvia Alvarado, female    DOB: Mar 23, 1971  Age: 51 y.o. MRN: [id number]  CC: Medication Refill   HPI Sylvia Alvarado presents for asthma, ADD/brain fog, anxiety Started [employer] 3 /wk  Outpatient Medications Prior to Visit  Medication Sig Dispense Refill   ADVAIR DISKUS 100-50 MCG/ACT AEPB INHALE 1 PUFF into THE lungs TWICE DAILY 60 each 11   albuterol (VENTOLIN HFA) 108 (90 Base) MCG/ACT inhaler Inhale 2 puffs into the lungs every 6 (six) hours as needed for wheezing or shortness of breath. 18 g 11   amoxicillin (AMOXIL) 875 MG tablet Take 875 mg by mouth 2 (two) times daily.     Cholecalciferol (VITAMIN D3) 50 MCG (2000 UT) capsule Take 1 capsule (2,000 Units total) by mouth daily. 100 capsule 3   clonazePAM (KLONOPIN) 1 MG tablet TAKE 1 TABLET BY MOUTH TWICE DAILY AS NEEDED 60 tablet 1   Cyanocobalamin (VITAMIN B-12) 500 MCG SUBL 1 sl qd (Patient taking differently: Place 500 mcg under the tongue daily.) 100 tablet 3   cyclobenzaprine (FLEXERIL) 10 MG tablet TAKE 1 TABLET BY MOUTH TWICE DAILY 60 tablet 3   Dexmethylphenidate HCl (FOCALIN XR) 35 MG CP24 Take 35 mg by mouth every morning. 30 capsule 0   diclofenac sodium (VOLTAREN) 1 % GEL APPLY 2-4 GRAMS TO AFFECTED AREA TWICE AS DIRECTED AS NEEDED. 200 g 0   DULoxetine (CYMBALTA) 30 MG capsule TAKE ONE CAPSULE BY MOUTH DAILY 90 capsule 2   fluticasone (FLONASE) 50 MCG/ACT nasal spray PLACE TWO SPRAYS INTO BOTH NOSTRILS DAILY 16 g 6   furosemide (LASIX) 20 MG tablet Take 1 tablet (20 mg total) by mouth daily as needed. 30 tablet 3   meloxicam (MOBIC) 15 MG tablet TAKE 1 TABLET BY MOUTH EVERY DAY 30 tablet 2   metFORMIN (GLUCOPHAGE) 500 MG tablet Take 1 tablet (500 mg total) by mouth daily with breakfast. 90 tablet 3   metoprolol succinate (TOPROL-XL) 25 MG 24 hr tablet TAKE 1 TABLET BY MOUTH EVERY DAY 90 tablet 2   pantoprazole (PROTONIX) 40 MG tablet TAKE 1 TABLET BY MOUTH DAILY 90 tablet 1    triamcinolone cream (KENALOG) 0.1 % APPLY TO THE AFFECTED AREA(S) FOUR TIMES DAILY 80 g 0   Semaglutide (RYBELSUS) 3 MG TABS Take 3 mg by mouth daily. 30 tablet 3   No facility-administered medications prior to visit.    ROS: Review of Systems  Constitutional:  Negative for activity change, appetite change, chills, fatigue and unexpected weight change.  HENT:  Negative for congestion, mouth sores and sinus pressure.   Eyes:  Negative for visual disturbance.  Respiratory:  Negative for cough and chest tightness.   Gastrointestinal:  Negative for abdominal pain and nausea.  Genitourinary:  Negative for difficulty urinating, frequency and vaginal pain.  Musculoskeletal:  Negative for back pain and gait problem.  Skin:  Negative for pallor and rash.  Neurological:  Negative for dizziness, tremors, weakness, numbness and headaches.  Psychiatric/Behavioral:  Positive for decreased concentration. Negative for confusion, sleep disturbance and suicidal ideas. The patient is nervous/anxious.     Objective:  BP 124/78 (BP Location: Left Arm, Patient Position: Sitting, Cuff Size: Normal)   Pulse 76   Temp 98.1 F (36.7 C) (Oral)   Ht '5\' 7"'$  (1.702 m)   Wt 292 lb (132.5 kg)   SpO2 99%   BMI 45.73 kg/m   BP Readings from Last 3 Encounters:  10/11/22 124/78  03/23/22 122/80  03/03/22 (!) 137/92    Wt Readings from Last 3 Encounters:  10/11/22 292 lb (132.5 kg)  07/13/22 290 lb (131.5 kg)  03/23/22 297 lb (134.7 kg)    Physical Exam Constitutional:      General: She is not in acute distress.    Appearance: She is well-developed. She is obese.  HENT:     Head: Normocephalic.     Right Ear: External ear normal.     Left Ear: External ear normal.     Nose: Nose normal.  Eyes:     General:        Right eye: No discharge.        Left eye: No discharge.     Conjunctiva/sclera: Conjunctivae normal.     Pupils: Pupils are equal, round, and reactive to light.  Neck:     Thyroid: No  thyromegaly.     Vascular: No JVD.     Trachea: No tracheal deviation.  Cardiovascular:     Rate and Rhythm: Normal rate and regular rhythm.     Heart sounds: Normal heart sounds.  Pulmonary:     Effort: No respiratory distress.     Breath sounds: No stridor. No wheezing.  Abdominal:     General: Bowel sounds are normal. There is no distension.     Palpations: Abdomen is soft. There is no mass.     Tenderness: There is no abdominal tenderness. There is no guarding or rebound.  Musculoskeletal:        General: No tenderness.     Cervical back: Normal range of motion and neck supple. No rigidity.  Lymphadenopathy:     Cervical: No cervical adenopathy.  Skin:    Findings: No erythema or rash.  Neurological:     Cranial Nerves: No cranial nerve deficit.     Motor: No abnormal muscle tone.     Coordination: Coordination normal.     Deep Tendon Reflexes: Reflexes normal.  Psychiatric:        Behavior: Behavior normal.        Thought Content: Thought content normal.        Judgment: Judgment normal.   L shoulder w/pain on ROM  Lab Results  Component Value Date   WBC 7.9 03/16/2021   HGB 13.2 03/16/2021   HCT 40.0 03/16/2021   PLT 226.0 03/16/2021   GLUCOSE 94 07/05/2022   CHOL 194 03/17/2022   TRIG 256 (H) 03/17/2022   HDL 57 03/17/2022   LDLDIRECT 135.0 03/18/2018   LDLCALC 94 03/17/2022   ALT 24 07/05/2022   AST 24 07/05/2022   NA 138 07/05/2022   K 4.6 07/05/2022   CL 101 07/05/2022   CREATININE 0.75 07/05/2022   BUN 16 07/05/2022   CO2 24 07/05/2022   TSH 0.925 03/17/2022   HGBA1C 5.7 (H) 07/05/2022   MICROALBUR <0.7 03/16/2021    No results found.  Assessment & Plan:   Problem List Items Addressed This Visit     Brain fog    Better on Focalin XR 35 mg qam   Potential benefits of a long term amphetamines  use as well as potential risks  and complications were explained to the patient and were aknowledged.      Diabetes mellitus type 2 in obese ([place]) -  Primary    Start Trulicity weekly      Relevant Medications   Dulaglutide (TRULICITY) 4.96 PR/9.1MB SOPN   Other Relevant Orders   Comprehensive metabolic panel   Hemoglobin A1c  Shoulder pain    Heat NSAIDs Voltaren gel      Situational depression    Cymbalta Clonazepam prn  Potential benefits of a long term benzodiazepines  use as well as potential risks  and complications were explained to the patient and were aknowledged.         Meds ordered this encounter  Medications   Dulaglutide (TRULICITY) 1.61 WR/6.0AV SOPN    Sig: Inject 0.75 mg into the skin once a week.    Dispense:  2 mL    Refill:  3      Follow-up: Return in about 3 months (around 01/11/2023) for a follow-up visit.  Walker Kehr, MD

## 2022-10-20 ENCOUNTER — Encounter: Payer: Self-pay | Admitting: Internal Medicine

## 2022-10-22 ENCOUNTER — Other Ambulatory Visit: Payer: Self-pay | Admitting: Internal Medicine

## 2022-11-07 ENCOUNTER — Encounter: Payer: Self-pay | Admitting: Internal Medicine

## 2022-11-09 ENCOUNTER — Other Ambulatory Visit: Payer: Self-pay | Admitting: Internal Medicine

## 2022-11-15 ENCOUNTER — Other Ambulatory Visit: Payer: Self-pay | Admitting: Internal Medicine

## 2022-11-15 DIAGNOSIS — F988 Other specified behavioral and emotional disorders with onset usually occurring in childhood and adolescence: Secondary | ICD-10-CM | POA: Insufficient documentation

## 2022-11-15 MED ORDER — AMPHETAMINE-DEXTROAMPHET ER 10 MG PO CP24: 10.0000 mg | ORAL_CAPSULE | Freq: Every day | ORAL | 0 refills | Status: DC

## 2022-11-15 NOTE — Progress Notes (Signed)
Focalin XR is not available

## 2022-11-20 ENCOUNTER — Other Ambulatory Visit: Payer: Self-pay | Admitting: Internal Medicine

## 2022-12-03 ENCOUNTER — Other Ambulatory Visit: Payer: Self-pay | Admitting: Internal Medicine

## 2022-12-11 ENCOUNTER — Other Ambulatory Visit: Payer: Self-pay | Admitting: Internal Medicine

## 2022-12-22 ENCOUNTER — Other Ambulatory Visit (HOSPITAL_COMMUNITY): Payer: Self-pay

## 2023-01-08 ENCOUNTER — Other Ambulatory Visit: Payer: Self-pay | Admitting: Internal Medicine

## 2023-01-08 DIAGNOSIS — E119 Type 2 diabetes mellitus without complications: Secondary | ICD-10-CM | POA: Diagnosis not present

## 2023-01-09 ENCOUNTER — Encounter: Payer: Self-pay | Admitting: Internal Medicine

## 2023-01-09 LAB — COMPREHENSIVE METABOLIC PANEL
ALT: 21 IU/L (ref 0–32)
AST: 27 IU/L (ref 0–40)
Albumin/Globulin Ratio: 1.7 (ref 1.2–2.2)
Albumin: 4.3 g/dL (ref 3.9–4.9)
Alkaline Phosphatase: 62 IU/L (ref 44–121)
BUN/Creatinine Ratio: 13 (ref 9–23)
BUN: 14 mg/dL (ref 6–24)
Bilirubin Total: 0.3 mg/dL (ref 0.0–1.2)
CO2: 22 mmol/L (ref 20–29)
Calcium: 9.7 mg/dL (ref 8.7–10.2)
Chloride: 102 mmol/L (ref 96–106)
Creatinine, Ser: 1.11 mg/dL — ABNORMAL HIGH (ref 0.57–1.00)
Globulin, Total: 2.5 g/dL (ref 1.5–4.5)
Glucose: 103 mg/dL — ABNORMAL HIGH (ref 70–99)
Potassium: 4.8 mmol/L (ref 3.5–5.2)
Sodium: 138 mmol/L (ref 134–144)
Total Protein: 6.8 g/dL (ref 6.0–8.5)
eGFR: 61 mL/min/{1.73_m2} (ref >59)

## 2023-01-09 LAB — HGB A1C W/O EAG: Hgb A1c MFr Bld: 5.9 % — ABNORMAL HIGH (ref 4.8–5.6)

## 2023-01-10 ENCOUNTER — Telehealth (INDEPENDENT_AMBULATORY_CARE_PROVIDER_SITE_OTHER): Payer: 59 | Admitting: Internal Medicine

## 2023-01-10 ENCOUNTER — Encounter: Payer: Self-pay | Admitting: Internal Medicine

## 2023-01-10 DIAGNOSIS — F988 Other specified behavioral and emotional disorders with onset usually occurring in childhood and adolescence: Secondary | ICD-10-CM

## 2023-01-10 DIAGNOSIS — Z6841 Body Mass Index (BMI) 40.0 and over, adult: Secondary | ICD-10-CM | POA: Diagnosis not present

## 2023-01-10 DIAGNOSIS — K760 Fatty (change of) liver, not elsewhere classified: Secondary | ICD-10-CM | POA: Diagnosis not present

## 2023-01-10 DIAGNOSIS — E669 Obesity, unspecified: Secondary | ICD-10-CM

## 2023-01-10 DIAGNOSIS — E1169 Type 2 diabetes mellitus with other specified complication: Secondary | ICD-10-CM

## 2023-01-10 DIAGNOSIS — R4189 Other symptoms and signs involving cognitive functions and awareness: Secondary | ICD-10-CM

## 2023-01-10 MED ORDER — TRULICITY 1.5 MG/0.5ML ~~LOC~~ SOAJ: 1.5000 mg | SUBCUTANEOUS | 2 refills | Status: DC

## 2023-01-10 MED ORDER — AMPHETAMINE-DEXTROAMPHET ER 10 MG PO CP24: 10.0000 mg | ORAL_CAPSULE | Freq: Every day | ORAL | 0 refills | Status: DC

## 2023-01-10 NOTE — Assessment & Plan Note (Signed)
On Adderall XR 10 mg/d  Potential benefits of a long term amphetamines  use as well as potential risks  and complications were explained to the patient and were aknowledged.

## 2023-01-10 NOTE — Assessment & Plan Note (Signed)
On Trulicity weekly. Increase the dose

## 2023-01-10 NOTE — Assessment & Plan Note (Signed)
Increase Trulicity weekly to 1.5 mg

## 2023-01-10 NOTE — Progress Notes (Signed)
Virtual Visit via Video Note  I connected with Sylvia Alvarado on 01/10/23 at  8:50 AM EST by a video enabled telemedicine application and verified that I am speaking with the correct person using two identifiers.   I discussed the limitations of evaluation and management by telemedicine and the availability of in person appointments. The patient expressed understanding and agreed to proceed.  I was located at our [facility] office. The patient was at home. There was no one else present in the visit.  No chief complaint on file.    History of Present Illness:  F/u on DM, obesity, ADD  Review of Systems  Constitutional:  Negative for fever and weight loss.  Musculoskeletal:  Negative for back pain and falls.  Skin:  Negative for rash.  Psychiatric/Behavioral:  Negative for depression, memory loss and substance abuse. The patient is not nervous/anxious.      Observations/Objective: The patient appears to be in no acute distress  Assessment and Plan:  Problem List Items Addressed This Visit       Digestive   Steatosis of liver    LFTs are better        Endocrine   Diabetes mellitus type 2 in obese ([place]) - Primary    Increase Trulicity weekly to 1.5 mg      Relevant Medications   Dulaglutide (TRULICITY) 1.5 0000000 SOPN     Other   Morbid obesity with BMI of 45.0-49.9, adult ([place])    On Trulicity weekly. Increase the dose      Relevant Medications   Dulaglutide (TRULICITY) 1.5 0000000 SOPN   amphetamine-dextroamphetamine (ADDERALL XR) 10 MG 24 hr capsule   amphetamine-dextroamphetamine (ADDERALL XR) 10 MG 24 hr capsule   amphetamine-dextroamphetamine (ADDERALL XR) 10 MG 24 hr capsule   Brain fog    On Adderall XR 10 mg/d  Potential benefits of a long term amphetamines  use as well as potential risks  and complications were explained to the patient and were aknowledged.       ADD (attention deficit disorder)    On Adderall XR 10 mg/d  Potential benefits  of a long term amphetamines  use as well as potential risks  and complications were explained to the patient and were aknowledged.        Meds ordered this encounter  Medications   Dulaglutide (TRULICITY) 1.5 0000000 SOPN    Sig: Inject 1.5 mg into the skin once a week.    Dispense:  2 mL    Refill:  2   amphetamine-dextroamphetamine (ADDERALL XR) 10 MG 24 hr capsule    Sig: Take 1 capsule (10 mg total) by mouth daily.    Dispense:  30 capsule    Refill:  0    Please fill on or after 03/11/23   amphetamine-dextroamphetamine (ADDERALL XR) 10 MG 24 hr capsule    Sig: Take 1 capsule (10 mg total) by mouth daily.    Dispense:  30 capsule    Refill:  0    Please fill on or after 02/09/23   amphetamine-dextroamphetamine (ADDERALL XR) 10 MG 24 hr capsule    Sig: Take 1 capsule (10 mg total) by mouth daily.    Dispense:  30 capsule    Refill:  0    Please fill on or after 01/10/23     Follow Up Instructions:    I discussed the assessment and treatment plan with the patient. The patient was provided an opportunity to ask questions and all  were answered. The patient agreed with the plan and demonstrated an understanding of the instructions.   The patient was advised to call back or seek an in-person evaluation if the symptoms worsen or if the condition fails to improve as anticipated.  I provided face-to-face time during this encounter. We were at different locations.   Walker Kehr, MD

## 2023-01-10 NOTE — Assessment & Plan Note (Signed)
LFTs are better

## 2023-01-11 ENCOUNTER — Telehealth: Payer: 59 | Admitting: Internal Medicine

## 2023-01-18 ENCOUNTER — Other Ambulatory Visit: Payer: Self-pay | Admitting: Internal Medicine

## 2023-01-26 ENCOUNTER — Other Ambulatory Visit: Payer: Self-pay | Admitting: Internal Medicine

## 2023-02-05 ENCOUNTER — Other Ambulatory Visit: Payer: Self-pay | Admitting: Internal Medicine

## 2023-02-05 NOTE — Telephone Encounter (Signed)
Pharmacy sent msg the 1.5 dose is on back order. Pt would like to continue dose until the 1.5 is available. Refill 0.75 dose.Marland KitchenJohny Alvarado

## 2023-02-27 ENCOUNTER — Other Ambulatory Visit: Payer: Self-pay | Admitting: Internal Medicine

## 2023-03-07 ENCOUNTER — Other Ambulatory Visit: Payer: Self-pay | Admitting: Internal Medicine

## 2023-03-07 ENCOUNTER — Encounter: Payer: Self-pay | Admitting: Internal Medicine

## 2023-03-10 DIAGNOSIS — Z823 Family history of stroke: Secondary | ICD-10-CM | POA: Diagnosis not present

## 2023-03-10 DIAGNOSIS — Z8249 Family history of ischemic heart disease and other diseases of the circulatory system: Secondary | ICD-10-CM | POA: Diagnosis not present

## 2023-03-10 DIAGNOSIS — J45909 Unspecified asthma, uncomplicated: Secondary | ICD-10-CM | POA: Diagnosis not present

## 2023-03-10 DIAGNOSIS — I10 Essential (primary) hypertension: Secondary | ICD-10-CM | POA: Diagnosis not present

## 2023-03-10 DIAGNOSIS — Z825 Family history of asthma and other chronic lower respiratory diseases: Secondary | ICD-10-CM | POA: Diagnosis not present

## 2023-03-10 DIAGNOSIS — M1990 Unspecified osteoarthritis, unspecified site: Secondary | ICD-10-CM | POA: Diagnosis not present

## 2023-03-10 DIAGNOSIS — Z7951 Long term (current) use of inhaled steroids: Secondary | ICD-10-CM | POA: Diagnosis not present

## 2023-03-10 DIAGNOSIS — Z833 Family history of diabetes mellitus: Secondary | ICD-10-CM | POA: Diagnosis not present

## 2023-03-10 DIAGNOSIS — K219 Gastro-esophageal reflux disease without esophagitis: Secondary | ICD-10-CM | POA: Diagnosis not present

## 2023-03-10 DIAGNOSIS — Z809 Family history of malignant neoplasm, unspecified: Secondary | ICD-10-CM | POA: Diagnosis not present

## 2023-03-10 DIAGNOSIS — Z791 Long term (current) use of non-steroidal anti-inflammatories (NSAID): Secondary | ICD-10-CM | POA: Diagnosis not present

## 2023-03-10 DIAGNOSIS — E119 Type 2 diabetes mellitus without complications: Secondary | ICD-10-CM | POA: Diagnosis not present

## 2023-03-15 ENCOUNTER — Other Ambulatory Visit (HOSPITAL_COMMUNITY)
Admission: RE | Admit: 2023-03-15 | Discharge: 2023-03-15 | Disposition: A | Discharge status: Home / Self Care | Payer: 59 | Source: Ambulatory Visit | Attending: Adult Health | Admitting: Adult Health

## 2023-03-15 ENCOUNTER — Ambulatory Visit (INDEPENDENT_AMBULATORY_CARE_PROVIDER_SITE_OTHER): Payer: 59 | Admitting: Adult Health

## 2023-03-15 ENCOUNTER — Encounter: Payer: Self-pay | Admitting: Adult Health

## 2023-03-15 VITALS — BP 123/83 | HR 72 | Ht 67.0 in | Wt 303.0 lb

## 2023-03-15 DIAGNOSIS — R232 Flushing: Secondary | ICD-10-CM

## 2023-03-15 DIAGNOSIS — R195 Other fecal abnormalities: Secondary | ICD-10-CM | POA: Diagnosis not present

## 2023-03-15 DIAGNOSIS — Z1211 Encounter for screening for malignant neoplasm of colon: Secondary | ICD-10-CM | POA: Diagnosis not present

## 2023-03-15 DIAGNOSIS — Z01419 Encounter for gynecological examination (general) (routine) without abnormal findings: Secondary | ICD-10-CM

## 2023-03-15 LAB — HEMOCCULT GUIAC POC 1CARD (OFFICE): Fecal Occult Blood, POC: POSITIVE — AB

## 2023-03-15 NOTE — Progress Notes (Signed)
Patient ID: Sylvia Alvarado, female   DOB: 1971/04/29, 52 y.o.   MRN: [id number] History of Present Illness: Sylvia Alvarado is a 52 year old white female, married, G1P1, in for a well woman gyn exam and pap. She is working out 5 days a week, 3 with a Psychologist, educational.   Last pap was 03/03/22 ASCUS negative HPV.  PCP is Dr Posey Rea.   Current Medications, Allergies, Past Medical History, Past Surgical History, Family History and Social History were reviewed in [employer] record.     Review of Systems: Patient denies any headaches, hearing loss, fatigue, blurred vision, shortness of breath, chest pain, abdominal pain, problems with bowel movements(have been hard), urination, or intercourse. No joint pain or mood swings.  +hot flashes, she is not sleeping great   Physical Exam:BP 123/83 (BP Location: Left Arm, Patient Position: Sitting, Cuff Size: Large)   Pulse 72   Ht 5\' 7"  (1.702 m)   Wt (!) 303 lb (137.4 kg)   BMI 47.46 kg/m   General:  Well developed, well nourished, no acute distress Skin:  Warm and dry Neck:  Midline trachea, normal thyroid, good ROM, no lymphadenopathy Lungs; Clear to auscultation bilaterally Breast:  No dominant palpable mass, retraction, or nipple discharge Cardiovascular: Regular rate and rhythm Abdomen:  Soft, non tender, no hepatosplenomegaly Pelvic:  External genitalia is normal in appearance, no lesions.  The vagina is normal in appearance. Urethra has no lesions or masses. The cervix is smooth, pap with HR HPV genotyping performed.   Uterus is felt to be normal size, shape, and contour.  No adnexal masses or tenderness noted.Bladder is non tender, no masses felt. Rectal: Good sphincter tone, no polyps, ?hemorrhoids felt.  Hemoccult positive. Extremities/musculoskeletal:  No swelling or varicosities noted, no clubbing or cyanosis Psych:  No mood changes, alert and cooperative,seems happy AA is 1 Fall risk is low    03/15/2023    3:39 PM 10/11/2022     8:06 AM 03/23/2022    9:11 AM  Depression screen PHQ 2/9  Decreased Interest 1 0 0  Down, Depressed, Hopeless 1 0 1  PHQ - 2 Score 2 0 1  Altered sleeping 1 1 1   Tired, decreased energy 1 0 1  Change in appetite 1 0 1  Feeling bad or failure about yourself  1 1 0  Trouble concentrating 1 0 1  Moving slowly or fidgety/restless 0 0 0  Suicidal thoughts 0 0 0  PHQ-9 Score 7 2 5   Difficult doing work/chores  Not difficult at all Somewhat difficult       03/15/2023    3:40 PM 03/03/2022    9:37 AM  GAD 7 : Generalized Anxiety Score  Nervous, Anxious, on Edge 1 1  Control/stop worrying 1 0  Worry too much - different things 1 0  Trouble relaxing 1 0  Restless 1 0  Easily annoyed or irritable 1 0  Afraid - awful might happen 1 0  Total GAD 7 Score 7 1      Upstream - 03/15/23 1545       Pregnancy Intention Screening   Does the patient want to become pregnant in the next year? No    Does the patient's partner want to become pregnant in the next year? No    Would the patient like to discuss contraceptive options today? No      Contraception Wrap Up   Current Method Female Sterilization    End Method Female Sterilization  Contraception Counseling Provided No            Examination chaperoned by Malachy Mood LPN   Impression and Plan: 1. Encounter for gynecological examination with Papanicolaou smear of cervix Pap sent Pap in 3 years if normal Physical in 1 year Labs with PCP Mammogram was negative 04/05/22.  - Cytology - PAP( [place])  2. Hot flashes  3. Encounter for screening fecal occult blood testing Hemoccult was + - POCT occult blood stool  4. Positive fecal occult blood test 3 hemoccult cards sent home with pt to do , if any + will refer for colonoscopy  - POCT occult blood stool

## 2023-03-22 LAB — CYTOLOGY - PAP
Comment: NEGATIVE
Comment: NEGATIVE
Comment: NEGATIVE
Diagnosis: NEGATIVE
HPV 16: NEGATIVE
HPV 18 / 45: NEGATIVE
High risk HPV: POSITIVE — AB

## 2023-03-23 ENCOUNTER — Encounter: Payer: Self-pay | Admitting: Adult Health

## 2023-03-23 DIAGNOSIS — R87810 Cervical high risk human papillomavirus (HPV) DNA test positive: Secondary | ICD-10-CM | POA: Insufficient documentation

## 2023-03-27 ENCOUNTER — Ambulatory Visit (INDEPENDENT_AMBULATORY_CARE_PROVIDER_SITE_OTHER): Payer: 59 | Admitting: Internal Medicine

## 2023-03-27 ENCOUNTER — Encounter: Payer: Self-pay | Admitting: Internal Medicine

## 2023-03-27 ENCOUNTER — Other Ambulatory Visit: Payer: Self-pay | Admitting: Internal Medicine

## 2023-03-27 VITALS — BP 120/80 | HR 93 | Temp 98.3°F | Ht 67.0 in | Wt 302.0 lb

## 2023-03-27 DIAGNOSIS — Z7985 Long-term (current) use of injectable non-insulin antidiabetic drugs: Secondary | ICD-10-CM | POA: Diagnosis not present

## 2023-03-27 DIAGNOSIS — R87810 Cervical high risk human papillomavirus (HPV) DNA test positive: Secondary | ICD-10-CM

## 2023-03-27 DIAGNOSIS — Z13220 Encounter for screening for lipoid disorders: Secondary | ICD-10-CM

## 2023-03-27 DIAGNOSIS — M797 Fibromyalgia: Secondary | ICD-10-CM | POA: Diagnosis not present

## 2023-03-27 DIAGNOSIS — Z6841 Body Mass Index (BMI) 40.0 and over, adult: Secondary | ICD-10-CM

## 2023-03-27 DIAGNOSIS — Z0000 Encounter for general adult medical examination without abnormal findings: Secondary | ICD-10-CM

## 2023-03-27 DIAGNOSIS — Z8249 Family history of ischemic heart disease and other diseases of the circulatory system: Secondary | ICD-10-CM

## 2023-03-27 DIAGNOSIS — Z1211 Encounter for screening for malignant neoplasm of colon: Secondary | ICD-10-CM

## 2023-03-27 DIAGNOSIS — E1169 Type 2 diabetes mellitus with other specified complication: Secondary | ICD-10-CM | POA: Diagnosis not present

## 2023-03-27 DIAGNOSIS — F4321 Adjustment disorder with depressed mood: Secondary | ICD-10-CM | POA: Diagnosis not present

## 2023-03-27 DIAGNOSIS — I10 Essential (primary) hypertension: Secondary | ICD-10-CM | POA: Diagnosis not present

## 2023-03-27 DIAGNOSIS — K760 Fatty (change of) liver, not elsewhere classified: Secondary | ICD-10-CM

## 2023-03-27 DIAGNOSIS — R739 Hyperglycemia, unspecified: Secondary | ICD-10-CM

## 2023-03-27 LAB — COMPREHENSIVE METABOLIC PANEL
ALT: 13 U/L (ref 0–35)
AST: 18 U/L (ref 0–37)
Albumin: 4.1 g/dL (ref 3.5–5.2)
Alkaline Phosphatase: 59 U/L (ref 39–117)
BUN: 19 mg/dL (ref 6–23)
CO2: 27 mEq/L (ref 19–32)
Calcium: 9.6 mg/dL (ref 8.4–10.5)
Chloride: 103 mEq/L (ref 96–112)
Creatinine, Ser: 0.93 mg/dL (ref 0.40–1.20)
GFR: 72.1 mL/min (ref >60.00)
Glucose, Bld: 89 mg/dL (ref 70–99)
Potassium: 4.5 mEq/L (ref 3.5–5.1)
Sodium: 139 mEq/L (ref 135–145)
Total Bilirubin: 0.3 mg/dL (ref 0.2–1.2)
Total Protein: 7.4 g/dL (ref 6.0–8.3)

## 2023-03-27 LAB — CBC WITH DIFFERENTIAL/PLATELET
Basophils Absolute: 0.1 10*3/uL (ref 0.0–0.1)
Basophils Relative: 1 % (ref 0.0–3.0)
Eosinophils Absolute: 0.1 10*3/uL (ref 0.0–0.7)
Eosinophils Relative: 1.9 % (ref 0.0–5.0)
HCT: 38.5 % (ref 36.0–46.0)
Hemoglobin: 12.5 g/dL (ref 12.0–15.0)
Lymphocytes Relative: 34.8 % (ref 12.0–46.0)
Lymphs Abs: 2.7 10*3/uL (ref 0.7–4.0)
MCHC: 32.4 g/dL (ref 30.0–36.0)
MCV: 80.8 fl (ref 78.0–100.0)
Monocytes Absolute: 0.5 10*3/uL (ref 0.1–1.0)
Monocytes Relative: 6.5 % (ref 3.0–12.0)
Neutro Abs: 4.3 10*3/uL (ref 1.4–7.7)
Neutrophils Relative %: 55.8 % (ref 43.0–77.0)
Platelets: 290 10*3/uL (ref 150.0–400.0)
RBC: 4.76 Mil/uL (ref 3.87–5.11)
RDW: 15.7 % — ABNORMAL HIGH (ref 11.5–15.5)
WBC: 7.8 10*3/uL (ref 4.0–10.5)

## 2023-03-27 LAB — LIPID PANEL
Cholesterol: 193 mg/dL (ref 0–200)
HDL: 52.9 mg/dL (ref >39.00)
NonHDL: 140.21
Total CHOL/HDL Ratio: 4
Triglycerides: 259 mg/dL — ABNORMAL HIGH (ref 0.0–149.0)
VLDL: 51.8 mg/dL — ABNORMAL HIGH (ref 0.0–40.0)

## 2023-03-27 LAB — MICROALBUMIN / CREATININE URINE RATIO
Creatinine,U: 49.9 mg/dL
Microalb Creat Ratio: 1.4 mg/g (ref 0.0–30.0)
Microalb, Ur: <0.7 mg/dL (ref 0.0–1.9)

## 2023-03-27 LAB — LDL CHOLESTEROL, DIRECT: Direct LDL: 114 mg/dL

## 2023-03-27 LAB — HEMOGLOBIN A1C: Hgb A1c MFr Bld: 6 % (ref 4.6–6.5)

## 2023-03-27 LAB — TSH: TSH: 0.62 u[IU]/mL (ref 0.35–5.50)

## 2023-03-27 LAB — FOLLICLE STIMULATING HORMONE: FSH: 46.7 m[IU]/mL

## 2023-03-27 MED ORDER — TRULICITY 3 MG/0.5ML ~~LOC~~ SOAJ: 3.0000 mg | SUBCUTANEOUS | 3 refills | Status: DC

## 2023-03-27 MED ORDER — AMPHETAMINE-DEXTROAMPHET ER 10 MG PO CP24: 10.0000 mg | ORAL_CAPSULE | Freq: Every day | ORAL | 0 refills | Status: DC

## 2023-03-27 NOTE — Assessment & Plan Note (Signed)
LFTs are better 

## 2023-03-27 NOTE — Assessment & Plan Note (Signed)
Check A1c. 

## 2023-03-27 NOTE — Assessment & Plan Note (Addendum)
  We discussed age appropriate health related issues, including available/recomended screening tests and vaccinations. Labs were ordered to be later reviewed . All questions were answered. We discussed one or more of the following - seat belt use, use of sunscreen/sun exposure exercise, safe sex, fall risk reduction, second hand smoke exposure, firearm use and storage, seat belt use, a need for adhering to healthy diet and exercise. Labs were ordered.  All questions were answered. Colon is advised. Cologuard discusussed - opted by the pt.  Coronary calcium CT score of 0 in 2021.

## 2023-03-27 NOTE — Assessment & Plan Note (Signed)
Colposcopy is pending

## 2023-03-27 NOTE — Progress Notes (Signed)
Subjective:  Patient ID: Sylvia Alvarado, female    DOB: 10/19/1971  Age: 52 y.o. MRN: [id number]  CC: Annual Exam (physical)   HPI Sylvia Alvarado presents for a well exam  Outpatient Medications Prior to Visit  Medication Sig Dispense Refill   ADVAIR DISKUS 100-50 MCG/ACT AEPB INHALE 1 PUFF into THE lungs TWICE DAILY 60 each 11   albuterol (VENTOLIN HFA) 108 (90 Base) MCG/ACT inhaler Inhale 2 puffs into the lungs every 6 (six) hours as needed for wheezing or shortness of breath. 18 g 11   B Complex-C (SUPER B COMPLEX PO) Take by mouth.     Cholecalciferol (VITAMIN D3) 50 MCG (2000 UT) capsule Take 1 capsule (2,000 Units total) by mouth daily. 100 capsule 3   clonazePAM (KLONOPIN) 1 MG tablet TAKE 1 TABLET BY MOUTH TWICE DAILY AS NEEDED 60 tablet 1   cyclobenzaprine (FLEXERIL) 10 MG tablet TAKE 1 TABLET BY MOUTH TWICE DAILY 60 tablet 3   DULoxetine (CYMBALTA) 30 MG capsule TAKE ONE CAPSULE BY MOUTH DAILY 90 capsule 1   fluticasone (FLONASE) 50 MCG/ACT nasal spray PLACE TWO SPRAYS INTO BOTH NOSTRILS DAILY 16 g 6   furosemide (LASIX) 20 MG tablet Take 1 tablet (20 mg total) by mouth daily as needed. 30 tablet 3   meloxicam (MOBIC) 15 MG tablet TAKE 1 TABLET BY MOUTH EVERY DAY 30 tablet 2   metFORMIN (GLUCOPHAGE) 500 MG tablet Take 1 tablet (500 mg total) by mouth daily with breakfast. Annual appt due in May must see provider for future refills 90 tablet 0   metoprolol succinate (TOPROL-XL) 25 MG 24 hr tablet TAKE 1 TABLET BY MOUTH EVERY DAY 90 tablet 2   pantoprazole (PROTONIX) 40 MG tablet TAKE 1 TABLET BY MOUTH DAILY 90 tablet 3   triamcinolone cream (KENALOG) 0.1 % APPLY TO THE AFFECTED AREA(S) FOUR TIMES DAILY 80 g 0   amphetamine-dextroamphetamine (ADDERALL XR) 10 MG 24 hr capsule Take 1 capsule (10 mg total) by mouth daily. 30 capsule 0   amphetamine-dextroamphetamine (ADDERALL XR) 10 MG 24 hr capsule Take 1 capsule (10 mg total) by mouth daily. 30 capsule 0   Dulaglutide  (TRULICITY) 1.5 MG/0.5ML SOPN Inject 1.5 mg into the skin once a week. 2 mL 2   amphetamine-dextroamphetamine (ADDERALL XR) 10 MG 24 hr capsule Take 1 capsule (10 mg total) by mouth daily. 30 capsule 0   No facility-administered medications prior to visit.    ROS: Review of Systems  Constitutional:  Positive for diaphoresis, fatigue and unexpected weight change. Negative for activity change, appetite change and chills.  HENT:  Negative for congestion, mouth sores and sinus pressure.   Eyes:  Negative for visual disturbance.  Respiratory:  Negative for cough and chest tightness.   Gastrointestinal:  Negative for abdominal pain and nausea.  Genitourinary:  Negative for difficulty urinating, frequency and vaginal pain.  Musculoskeletal:  Negative for back pain and gait problem.  Skin:  Negative for pallor and rash.  Neurological:  Negative for dizziness, tremors, weakness, numbness and headaches.  Psychiatric/Behavioral:  Positive for decreased concentration and dysphoric mood. Negative for confusion, sleep disturbance and suicidal ideas. The patient is not nervous/anxious.     Objective:  BP 120/80 (BP Location: Left Arm, Patient Position: Sitting, Cuff Size: Normal)   Pulse 93   Temp 98.3 F (36.8 C) (Oral)   Ht 5\' 7"  (1.702 m)   Wt (!) 302 lb (137 kg)   SpO2 98%   BMI 47.30 kg/m  BP Readings from Last 3 Encounters:  03/27/23 120/80  03/15/23 123/83  10/11/22 124/78    Wt Readings from Last 3 Encounters:  03/27/23 (!) 302 lb (137 kg)  03/15/23 (!) 303 lb (137.4 kg)  10/11/22 292 lb (132.5 kg)    Physical Exam Constitutional:      General: She is not in acute distress.    Appearance: She is well-developed. She is obese.  HENT:     Head: Normocephalic.     Right Ear: External ear normal.     Left Ear: External ear normal.     Nose: Nose normal.  Eyes:     General:        Right eye: No discharge.        Left eye: No discharge.     Conjunctiva/sclera: Conjunctivae  normal.     Pupils: Pupils are equal, round, and reactive to light.  Neck:     Thyroid: No thyromegaly.     Vascular: No JVD.     Trachea: No tracheal deviation.  Cardiovascular:     Rate and Rhythm: Normal rate and regular rhythm.     Heart sounds: Normal heart sounds.  Pulmonary:     Effort: No respiratory distress.     Breath sounds: No stridor. No wheezing.  Abdominal:     General: Bowel sounds are normal. There is no distension.     Palpations: Abdomen is soft. There is no mass.     Tenderness: There is no abdominal tenderness. There is no guarding or rebound.  Musculoskeletal:        General: No tenderness.     Cervical back: Normal range of motion and neck supple. No rigidity.  Lymphadenopathy:     Cervical: No cervical adenopathy.  Skin:    Findings: No erythema or rash.  Neurological:     Mental Status: She is oriented to person, place, and time.     Cranial Nerves: No cranial nerve deficit.     Motor: No abnormal muscle tone.     Coordination: Coordination normal.     Deep Tendon Reflexes: Reflexes normal.  Psychiatric:        Behavior: Behavior normal.        Thought Content: Thought content normal.        Judgment: Judgment normal.    B varicouse veins Lab Results  Component Value Date   WBC 7.9 03/16/2021   HGB 13.2 03/16/2021   HCT 40.0 03/16/2021   PLT 226.0 03/16/2021   GLUCOSE 103 (H) 01/08/2023   CHOL 194 03/17/2022   TRIG 256 (H) 03/17/2022   HDL 57 03/17/2022   LDLDIRECT 135.0 03/18/2018   LDLCALC 94 03/17/2022   ALT 21 01/08/2023   AST 27 01/08/2023   NA 138 01/08/2023   K 4.8 01/08/2023   CL 102 01/08/2023   CREATININE 1.11 (H) 01/08/2023   BUN 14 01/08/2023   CO2 22 01/08/2023   TSH 0.925 03/17/2022   HGBA1C 5.9 (H) 01/08/2023   MICROALBUR <0.7 03/16/2021    No results found.  Assessment & Plan:   Problem List Items Addressed This Visit     Morbid obesity with BMI of 45.0-49.9, adult ([facility]CC)    Wt Readings from Last 3  Encounters:  03/27/23 (!) 302 lb (137 kg)  03/15/23 (!) 303 lb (137.4 kg)  10/11/22 292 lb (132.5 kg)        Relevant Medications   Dulaglutide (TRULICITY) 3 MG/0.5ML SOPN   amphetamine-dextroamphetamine (ADDERALL XR) 10  MG 24 hr capsule   amphetamine-dextroamphetamine (ADDERALL XR) 10 MG 24 hr capsule   amphetamine-dextroamphetamine (ADDERALL XR) 10 MG 24 hr capsule   Situational depression    Possibly bipolar depression Cymbalta Clonazepam prn  Potential benefits of a long term benzodiazepines  use as well as potential risks  and complications were explained to the patient and were aknowledged.      Essential hypertension    Cont Toprol      Fibromyalgia    On Cymbalta  Potential benefits of a long term benzodiazepines  use as well as potential risks  and complications were explained to the patient and were aknowledged. Gabapentin      Well adult exam - Primary     We discussed age appropriate health related issues, including available/recomended screening tests and vaccinations. Labs were ordered to be later reviewed . All questions were answered. We discussed one or more of the following - seat belt use, use of sunscreen/sun exposure exercise, safe sex, fall risk reduction, second hand smoke exposure, firearm use and storage, seat belt use, a need for adhering to healthy diet and exercise. Labs were ordered.  All questions were answered. Colon is advised. Cologuard discusussed - opted by the pt.  Coronary calcium CT score of 0 in 2021.        Relevant Orders   Urine microalbumin-creatinine with uACR   Hemoglobin A1c   CBC with Differential/Platelet   Comprehensive metabolic panel   Hemoglobin A1c   Lipid panel   TSH   FSH   Steatosis of liver    LFTs are better      Hyperglycemia    Check A1c      Relevant Orders   Hemoglobin A1c   Hemoglobin A1c   FSH   Family history of early CAD    Check lipids      Type 2 diabetes mellitus with obesity (HCC)     Check labs Increase  Trulicity dose weekly Coronary calcium CT score of 0 in 2021.       Relevant Medications   Dulaglutide (TRULICITY) 3 MG/0.5ML SOPN   Papanicolaou smear of cervix with positive high risk human papilloma virus (HPV) test    Colposcopy is pending      Other Visit Diagnoses     Screening for colon cancer       Relevant Orders   Cologuard         Meds ordered this encounter  Medications   Dulaglutide (TRULICITY) 3 MG/0.5ML SOPN    Sig: Inject 3 mg as directed once a week.    Dispense:  2 mL    Refill:  3   amphetamine-dextroamphetamine (ADDERALL XR) 10 MG 24 hr capsule    Sig: Take 1 capsule (10 mg total) by mouth daily.    Dispense:  30 capsule    Refill:  0    Please fill on or after 04/10/23   amphetamine-dextroamphetamine (ADDERALL XR) 10 MG 24 hr capsule    Sig: Take 1 capsule (10 mg total) by mouth daily.    Dispense:  30 capsule    Refill:  0    Please fill on or after 05/10/23   amphetamine-dextroamphetamine (ADDERALL XR) 10 MG 24 hr capsule    Sig: Take 1 capsule (10 mg total) by mouth daily.    Dispense:  30 capsule    Refill:  0    Please fill on or after 06/09/23      Follow-up: Return in about  3 months (around 06/27/2023) for a follow-up visit.  Sonda Primes, MD

## 2023-03-27 NOTE — Assessment & Plan Note (Addendum)
Check labs Increase  Trulicity dose weekly Coronary calcium CT score of 0 in 2021.

## 2023-03-27 NOTE — Assessment & Plan Note (Signed)
Possibly bipolar depression Cymbalta Clonazepam prn  Potential benefits of a long term benzodiazepines  use as well as potential risks  and complications were explained to the patient and were aknowledged. 

## 2023-03-27 NOTE — Assessment & Plan Note (Signed)
Wt Readings from Last 3 Encounters:  03/27/23 (!) 302 lb (137 kg)  03/15/23 (!) 303 lb (137.4 kg)  10/11/22 292 lb (132.5 kg)

## 2023-03-27 NOTE — Assessment & Plan Note (Signed)
Check lipids 

## 2023-03-27 NOTE — Assessment & Plan Note (Signed)
Check FSH 

## 2023-03-27 NOTE — Assessment & Plan Note (Signed)
Cont Toprol °

## 2023-03-27 NOTE — Assessment & Plan Note (Signed)
On Cymbalta  Potential benefits of a long term benzodiazepines  use as well as potential risks  and complications were explained to the patient and were aknowledged. Gabapentin

## 2023-03-28 ENCOUNTER — Ambulatory Visit (INDEPENDENT_AMBULATORY_CARE_PROVIDER_SITE_OTHER): Payer: 59 | Admitting: Obstetrics & Gynecology

## 2023-03-28 ENCOUNTER — Other Ambulatory Visit (HOSPITAL_COMMUNITY)
Admission: RE | Admit: 2023-03-28 | Discharge: 2023-03-28 | Disposition: A | Discharge status: Home / Self Care | Payer: 59 | Source: Ambulatory Visit | Attending: Obstetrics & Gynecology | Admitting: Obstetrics & Gynecology

## 2023-03-28 ENCOUNTER — Encounter: Payer: Self-pay | Admitting: Obstetrics & Gynecology

## 2023-03-28 ENCOUNTER — Other Ambulatory Visit (INDEPENDENT_AMBULATORY_CARE_PROVIDER_SITE_OTHER): Payer: 59

## 2023-03-28 VITALS — BP 126/87 | HR 80 | Ht 67.0 in | Wt 304.0 lb

## 2023-03-28 DIAGNOSIS — R87810 Cervical high risk human papillomavirus (HPV) DNA test positive: Secondary | ICD-10-CM | POA: Insufficient documentation

## 2023-03-28 DIAGNOSIS — Z1211 Encounter for screening for malignant neoplasm of colon: Secondary | ICD-10-CM

## 2023-03-28 DIAGNOSIS — Z1212 Encounter for screening for malignant neoplasm of rectum: Secondary | ICD-10-CM

## 2023-03-28 LAB — HEMOCCULT GUIAC POC 1CARD (OFFICE)
Card #2 Fecal Occult Blod, POC: NEGATIVE
Card #3 Fecal Occult Blood, POC: NEGATIVE
Fecal Occult Blood, POC: NEGATIVE

## 2023-03-28 NOTE — Progress Notes (Signed)
Patient name: Sylvia Alvarado MRN [id number]  Date of birth: 04-15-1971 Chief Complaint:   Colposcopy  History of Present Illness:   Sylvia Alvarado is a 52 y.o. G1P1 female being seen today for cervical dysplasia management.  Smoker:  No. New sexual partner:  No.   Recent pap: Normal, HPV positive other  Vaginal bleeding.  Patient's status post endometrial ablation.  Denies pelvic or abdominal pain.  Denies vaginal discharge itching or irritation.  Reports no acute GYN concerns  Prior cytology: denies h/o abnormal pap  No LMP recorded. Patient has had an ablation.     03/15/2023    3:39 PM 10/11/2022    8:06 AM 03/23/2022    9:11 AM 03/03/2022    9:37 AM 03/16/2021    8:13 AM  Depression screen PHQ 2/9  Decreased Interest 1 0 0 0 1  Down, Depressed, Hopeless 1 0 1 0 2  PHQ - 2 Score 2 0 1 0 3  Altered sleeping 1 1 1  0 2  Tired, decreased energy 1 0 1 0 1  Change in appetite 1 0 1 1 0  Feeling bad or failure about yourself  1 1 0 0 0  Trouble concentrating 1 0 1 1 2   Moving slowly or fidgety/restless 0 0 0 0 0  Suicidal thoughts 0 0 0 0 0  PHQ-9 Score 7 2 5 2 8   Difficult doing work/chores  Not difficult at all Somewhat difficult  Somewhat difficult     Review of Systems:   Pertinent items are noted in HPI Denies fever/chills, dizziness, headaches, visual disturbances, fatigue, shortness of breath, chest pain, abdominal pain, vomiting, no problems with periods, bowel movements, urination, or intercourse unless otherwise stated above.  Pertinent History Reviewed:  Reviewed past medical,surgical, social, obstetrical and family history.  Reviewed problem list, medications and allergies. Physical Assessment:   Vitals:   03/28/23 1357  BP: 126/87  Pulse: 80  Weight: (!) 304 lb (137.9 kg)  Height: 5\' 7"  (1.702 m)  Body mass index is 47.61 kg/m.       Physical Examination:   General appearance: alert, well appearing, and in no distress  Psych: mood appropriate,  normal affect  Skin: warm & dry   Cardiovascular: normal heart rate noted  Respiratory: normal respiratory effort, no distress  Abdomen: soft, non-tender   Pelvic: VULVA: normal appearing vulva with no masses, tenderness or lesions, VAGINA: normal appearing vagina with normal color and discharge, no lesions, redundant posterior vaginal mucosa, large speculum used  CERVIX: See colposcopy section  extremities: no calf tenderness bilaterally  Chaperone: Faith Rogue     Colposcopy Procedure Note  Indications: HPV positive other  Procedure Details  The risks and benefits of the procedure and Written informed consent obtained.  Speculum placed in vagina and excellent visualization of cervix achieved, cervix swabbed x 3 with acetic acid solution.  Findings: Adequate colposcopy is noted today.  TMZ zone visualized  Cervix: acetowhite lesion(s) noted at 12 o'clock; ECC and cervical biopsies obtained.    Monsel's applied.  Adequate hemostasis noted  Specimens: ECC, cervical biopsy  Complications: none.  Colposcopic Impression: Negative   Plan(Based on 2019 ASCCP recommendations)  -Discussed HPV- reviewed incidence and its potential to cause condylomas to dysplasia to cervical cancer -Reviewed degree of abnormal pap smears  -Discussed ASCCP guidelines and current recommendations for colposcopy -As above, inform consent obtained and procedure completed -biopsies obtained, further management pending results -Questions and concerns were addressed  Victorino Dike  Charlotta Newton, DO Attending Obstetrician & Gynecologist, [facility] for [employer], [facility]

## 2023-03-30 LAB — SURGICAL PATHOLOGY

## 2023-04-05 ENCOUNTER — Telehealth: Payer: Self-pay

## 2023-04-05 NOTE — Telephone Encounter (Signed)
PA initiated via Covermymeds; KEY: [id number]. Awaiting determination.

## 2023-04-06 NOTE — Telephone Encounter (Signed)
PA approved.

## 2023-04-13 ENCOUNTER — Other Ambulatory Visit (HOSPITAL_COMMUNITY): Payer: Self-pay

## 2023-04-17 ENCOUNTER — Other Ambulatory Visit (HOSPITAL_COMMUNITY): Payer: Self-pay

## 2023-04-19 ENCOUNTER — Encounter: Payer: Self-pay | Admitting: Internal Medicine

## 2023-04-24 ENCOUNTER — Ambulatory Visit
Admission: RE | Admit: 2023-04-24 | Discharge: 2023-04-24 | Disposition: A | Discharge status: Home / Self Care | Payer: 59 | Source: Ambulatory Visit | Attending: Internal Medicine | Admitting: Internal Medicine

## 2023-04-24 DIAGNOSIS — Z1231 Encounter for screening mammogram for malignant neoplasm of breast: Secondary | ICD-10-CM | POA: Diagnosis not present

## 2023-04-26 ENCOUNTER — Other Ambulatory Visit: Payer: Self-pay | Admitting: Internal Medicine

## 2023-05-14 DIAGNOSIS — S39012A Strain of muscle, fascia and tendon of lower back, initial encounter: Secondary | ICD-10-CM | POA: Diagnosis not present

## 2023-05-14 DIAGNOSIS — W19XXXA Unspecified fall, initial encounter: Secondary | ICD-10-CM | POA: Diagnosis not present

## 2023-05-14 DIAGNOSIS — M25511 Pain in right shoulder: Secondary | ICD-10-CM | POA: Diagnosis not present

## 2023-05-14 DIAGNOSIS — Z299 Encounter for prophylactic measures, unspecified: Secondary | ICD-10-CM | POA: Diagnosis not present

## 2023-05-29 ENCOUNTER — Other Ambulatory Visit: Payer: Self-pay | Admitting: Internal Medicine

## 2023-06-25 ENCOUNTER — Other Ambulatory Visit: Payer: Self-pay | Admitting: Internal Medicine

## 2023-06-26 ENCOUNTER — Telehealth (INDEPENDENT_AMBULATORY_CARE_PROVIDER_SITE_OTHER): Payer: 59 | Admitting: Internal Medicine

## 2023-06-26 ENCOUNTER — Encounter: Payer: Self-pay | Admitting: Internal Medicine

## 2023-06-26 DIAGNOSIS — E669 Obesity, unspecified: Secondary | ICD-10-CM | POA: Diagnosis not present

## 2023-06-26 DIAGNOSIS — Z7984 Long term (current) use of oral hypoglycemic drugs: Secondary | ICD-10-CM

## 2023-06-26 DIAGNOSIS — Z6841 Body Mass Index (BMI) 40.0 and over, adult: Secondary | ICD-10-CM

## 2023-06-26 DIAGNOSIS — E1169 Type 2 diabetes mellitus with other specified complication: Secondary | ICD-10-CM

## 2023-06-26 DIAGNOSIS — F988 Other specified behavioral and emotional disorders with onset usually occurring in childhood and adolescence: Secondary | ICD-10-CM

## 2023-06-26 MED ORDER — AMPHETAMINE-DEXTROAMPHET ER 10 MG PO CP24: 10.0000 mg | ORAL_CAPSULE | Freq: Every day | ORAL | 0 refills | Status: DC

## 2023-06-26 NOTE — Progress Notes (Signed)
Virtual Visit via Video Note  I connected with Sylvia Alvarado on 06/26/23 at  4:00 PM EDT by a video enabled telemedicine application and verified that I am speaking with the correct person using two identifiers.   I discussed the limitations of evaluation and management by telemedicine and the availability of in person appointments. The patient expressed understanding and agreed to proceed.  I was located at our [facility] office. The patient was at home. There was no one else present in the visit.  No chief complaint on file.    History of Present Illness: F/u on ADD, DM, obesity Trulicity is not covered well - $$$  Review of Systems  Constitutional:  Positive for diaphoresis and malaise/fatigue.  Cardiovascular:  Negative for leg swelling.  Musculoskeletal:  Positive for joint pain.  Neurological:  Negative for weakness.  Psychiatric/Behavioral:  Positive for depression. The patient is nervous/anxious.      Observations/Objective: The patient appears to be in no acute distress  Assessment and Plan:  Problem List Items Addressed This Visit     Morbid obesity with BMI of 45.0-49.9, adult (HCC) - Primary    Stable effort Regular gym workouts      Relevant Medications   amphetamine-dextroamphetamine (ADDERALL XR) 10 MG 24 hr capsule   amphetamine-dextroamphetamine (ADDERALL XR) 10 MG 24 hr capsule   amphetamine-dextroamphetamine (ADDERALL XR) 10 MG 24 hr capsule   Type 2 diabetes mellitus with obesity (HCC)    Checked A1c at work -5.9% On Metformin po Trulicity is not covered well - $$$ RTC 3 mo      ADD (attention deficit disorder)    On Adderall XR 10 mg/d  Potential benefits of a long term amphetamines  use as well as potential risks  and complications were explained to the patient and were aknowledged.        Meds ordered this encounter  Medications   amphetamine-dextroamphetamine (ADDERALL XR) 10 MG 24 hr capsule    Sig: Take 1 capsule (10 mg total)  by mouth daily.    Dispense:  30 capsule    Refill:  0    Please fill on or after 07/09/23   amphetamine-dextroamphetamine (ADDERALL XR) 10 MG 24 hr capsule    Sig: Take 1 capsule (10 mg total) by mouth daily.    Dispense:  30 capsule    Refill:  0    Please fill on or after 08/08/23   amphetamine-dextroamphetamine (ADDERALL XR) 10 MG 24 hr capsule    Sig: Take 1 capsule (10 mg total) by mouth daily.    Dispense:  30 capsule    Refill:  0    Please fill on or after 09/07/23     Follow Up Instructions:    I discussed the assessment and treatment plan with the patient. The patient was provided an opportunity to ask questions and all were answered. The patient agreed with the plan and demonstrated an understanding of the instructions.   The patient was advised to call back or seek an in-person evaluation if the symptoms worsen or if the condition fails to improve as anticipated.  I provided face-to-face time during this encounter. We were at different locations.   Sonda Primes, MD

## 2023-06-26 NOTE — Assessment & Plan Note (Signed)
Checked A1c at work -5.9% On Metformin po Trulicity is not covered well - $$$ RTC 3 mo

## 2023-06-26 NOTE — Assessment & Plan Note (Addendum)
Stable effort Regular gym workouts

## 2023-06-26 NOTE — Assessment & Plan Note (Signed)
On Adderall XR 10 mg/d  Potential benefits of a long term amphetamines  use as well as potential risks  and complications were explained to the patient and were aknowledged.

## 2023-07-12 DIAGNOSIS — Z299 Encounter for prophylactic measures, unspecified: Secondary | ICD-10-CM | POA: Diagnosis not present

## 2023-07-12 DIAGNOSIS — J320 Chronic maxillary sinusitis: Secondary | ICD-10-CM | POA: Diagnosis not present

## 2023-07-12 DIAGNOSIS — R52 Pain, unspecified: Secondary | ICD-10-CM | POA: Diagnosis not present

## 2023-07-25 ENCOUNTER — Other Ambulatory Visit: Payer: Self-pay | Admitting: Internal Medicine

## 2023-08-15 ENCOUNTER — Encounter: Payer: Self-pay | Admitting: Internal Medicine

## 2023-08-20 ENCOUNTER — Other Ambulatory Visit: Payer: Self-pay | Admitting: Internal Medicine

## 2023-08-20 MED ORDER — AMPHETAMINE-DEXTROAMPHET ER 15 MG PO CP24
15.0000 mg | ORAL_CAPSULE | ORAL | 0 refills | Status: DC
Start: 2023-08-20 — End: 2023-10-31

## 2023-08-20 NOTE — Progress Notes (Signed)
Adderall XR 10 mg is not available, 15 mg is available

## 2023-08-22 ENCOUNTER — Other Ambulatory Visit: Payer: Self-pay | Admitting: Internal Medicine

## 2023-08-22 MED ORDER — AMPHETAMINE-DEXTROAMPHETAMINE 7.5 MG PO TABS: 7.5000 mg | ORAL_TABLET | Freq: Two times a day (BID) | ORAL | 0 refills | Status: DC

## 2023-08-26 ENCOUNTER — Other Ambulatory Visit: Payer: Self-pay | Admitting: Internal Medicine

## 2023-09-21 ENCOUNTER — Other Ambulatory Visit: Payer: Self-pay | Admitting: Internal Medicine

## 2023-09-23 ENCOUNTER — Other Ambulatory Visit: Payer: Self-pay | Admitting: Internal Medicine

## 2023-10-15 ENCOUNTER — Encounter: Payer: Self-pay | Admitting: Internal Medicine

## 2023-10-15 NOTE — Telephone Encounter (Signed)
 Care team updated and letter sent for eye exam notes.

## 2023-10-24 ENCOUNTER — Other Ambulatory Visit: Payer: Self-pay | Admitting: Internal Medicine

## 2023-10-31 ENCOUNTER — Ambulatory Visit: Payer: 59 | Admitting: Internal Medicine

## 2023-11-15 ENCOUNTER — Encounter: Payer: Self-pay | Admitting: Internal Medicine

## 2023-11-20 ENCOUNTER — Encounter: Payer: Self-pay | Admitting: Internal Medicine

## 2023-11-20 ENCOUNTER — Ambulatory Visit: Payer: No Typology Code available for payment source | Admitting: Internal Medicine

## 2023-11-20 VITALS — BP 120/70 | HR 99 | Temp 98.2°F | Ht 67.0 in | Wt 320.0 lb

## 2023-11-20 DIAGNOSIS — F4321 Adjustment disorder with depressed mood: Secondary | ICD-10-CM

## 2023-11-20 DIAGNOSIS — J4521 Mild intermittent asthma with (acute) exacerbation: Secondary | ICD-10-CM

## 2023-11-20 DIAGNOSIS — I10 Essential (primary) hypertension: Secondary | ICD-10-CM | POA: Diagnosis not present

## 2023-11-20 DIAGNOSIS — R635 Abnormal weight gain: Secondary | ICD-10-CM | POA: Insufficient documentation

## 2023-11-20 DIAGNOSIS — Z6841 Body Mass Index (BMI) 40.0 and over, adult: Secondary | ICD-10-CM

## 2023-11-20 DIAGNOSIS — R519 Headache, unspecified: Secondary | ICD-10-CM | POA: Diagnosis not present

## 2023-11-20 DIAGNOSIS — G8929 Other chronic pain: Secondary | ICD-10-CM

## 2023-11-20 MED ORDER — AMPHETAMINE-DEXTROAMPHET ER 10 MG PO CP24: 10.0000 mg | ORAL_CAPSULE | Freq: Every day | ORAL | 0 refills | Status: DC

## 2023-11-20 MED ORDER — CEFDINIR 300 MG PO CAPS: 300.0000 mg | ORAL_CAPSULE | Freq: Two times a day (BID) | ORAL | 0 refills | Status: DC

## 2023-11-20 NOTE — Progress Notes (Signed)
 Subjective:  Patient ID: Sylvia Alvarado, female    DOB: 11-Apr-1971  Age: 53 y.o. MRN: [id number]  CC: Fatigue   HPI Sylvia Alvarado presents for HA, fatigue, sinus pain HAs after each meal in 10-15 min x 1 month  On CPAP   Outpatient Medications Prior to Visit  Medication Sig Dispense Refill   ADVAIR DISKUS 100-50 MCG/ACT AEPB INHALE 1 PUFF into THE lungs TWICE DAILY 60 each 11   albuterol  (VENTOLIN  HFA) 108 (90 Base) MCG/ACT inhaler Inhale 2 puffs into the lungs every 6 (six) hours as needed for wheezing or shortness of breath. 18 g 11   amphetamine -dextroamphetamine  (ADDERALL) 7.5 MG tablet TAKE 1 TABLET BY MOUTH TWICE DAILY (morning AND AT lunchtime) 60 tablet 0   B Complex-C (SUPER B COMPLEX PO) Take by mouth.     Cholecalciferol  (VITAMIN D3) 50 MCG (2000 UT) capsule Take 1 capsule (2,000 Units total) by mouth daily. 100 capsule 3   cyclobenzaprine  (FLEXERIL ) 10 MG tablet TAKE 1 TABLET BY MOUTH TWICE DAILY 60 tablet 3   DULoxetine  (CYMBALTA ) 30 MG capsule TAKE ONE CAPSULE BY MOUTH DAILY 90 capsule 1   fluticasone  (FLONASE ) 50 MCG/ACT nasal spray PLACE TWO SPRAYS INTO BOTH NOSTRILS DAILY 16 g 6   furosemide  (LASIX ) 20 MG tablet Take 1 tablet (20 mg total) by mouth daily as needed. 30 tablet 3   meloxicam  (MOBIC ) 15 MG tablet TAKE 1 TABLET BY MOUTH EVERY DAY 30 tablet 2   metFORMIN  (GLUCOPHAGE ) 500 MG tablet TAKE 1 TABLET BY MOUTH DAILY WITH BREAKFAST (annual APPOINTMENT due in MAY must see provider FOR future refills) 90 tablet 3   triamcinolone  cream (KENALOG ) 0.1 % APPLY TO THE AFFECTED AREA(S) FOUR TIMES DAILY 80 g 0   amphetamine -dextroamphetamine  (ADDERALL XR) 10 MG 24 hr capsule Take 1 capsule (10 mg total) by mouth daily. 30 capsule 0   amphetamine -dextroamphetamine  (ADDERALL XR) 10 MG 24 hr capsule Take 1 capsule (10 mg total) by mouth daily. 30 capsule 0   amphetamine -dextroamphetamine  (ADDERALL XR) 10 MG 24 hr capsule Take 1 capsule (10 mg total) by mouth daily. 30  capsule 0   amphetamine -dextroamphetamine  (ADDERALL XR) 15 MG 24 hr capsule TAKE 1 CAPSULE BY MOUTH IN THE MORNING 30 capsule 0   clonazePAM  (KLONOPIN ) 1 MG tablet TAKE 1 TABLET BY MOUTH TWICE DAILY AS NEEDED 60 tablet 1   metoprolol  succinate (TOPROL -XL) 25 MG 24 hr tablet TAKE 1 TABLET BY MOUTH EVERY DAY 90 tablet 2   pantoprazole  (PROTONIX ) 40 MG tablet TAKE 1 TABLET BY MOUTH DAILY 90 tablet 3   No facility-administered medications prior to visit.    ROS: Review of Systems  Constitutional:  Negative for activity change, appetite change, chills, fatigue and unexpected weight change.  HENT:  Positive for congestion. Negative for mouth sores and sinus pressure.   Eyes:  Negative for visual disturbance.  Respiratory:  Negative for cough and chest tightness.   Cardiovascular:  Positive for leg swelling.  Gastrointestinal:  Negative for abdominal pain and nausea.  Genitourinary:  Negative for difficulty urinating, frequency and vaginal pain.  Musculoskeletal:  Positive for back pain. Negative for gait problem.  Skin:  Negative for pallor and rash.  Neurological:  Positive for headaches. Negative for dizziness, tremors, weakness and numbness.  Psychiatric/Behavioral:  Negative for confusion and sleep disturbance.     Objective:  BP 120/70 (BP Location: Left Arm, Patient Position: Sitting, Cuff Size: Normal)   Pulse 99   Temp 98.2 F (  36.8 C) (Oral)   Ht 5' 7 (1.702 m)   Wt (!) 320 lb (145.2 kg)   SpO2 99%   BMI 50.12 kg/m   BP Readings from Last 3 Encounters:  11/20/23 120/70  03/28/23 126/87  03/27/23 120/80    Wt Readings from Last 3 Encounters:  11/20/23 (!) 320 lb (145.2 kg)  03/28/23 (!) 304 lb (137.9 kg)  03/27/23 (!) 302 lb (137 kg)    Physical Exam Constitutional:      General: She is not in acute distress.    Appearance: She is well-developed.  HENT:     Head: Normocephalic.     Right Ear: External ear normal.     Left Ear: External ear normal.     Nose:  Nose normal.  Eyes:     General:        Right eye: No discharge.        Left eye: No discharge.     Conjunctiva/sclera: Conjunctivae normal.     Pupils: Pupils are equal, round, and reactive to light.  Neck:     Thyroid : No thyromegaly.     Vascular: No JVD.     Trachea: No tracheal deviation.  Cardiovascular:     Rate and Rhythm: Normal rate and regular rhythm.     Heart sounds: Normal heart sounds.  Pulmonary:     Effort: No respiratory distress.     Breath sounds: No stridor. No wheezing.  Abdominal:     General: Bowel sounds are normal. There is no distension.     Palpations: Abdomen is soft. There is no mass.     Tenderness: There is no abdominal tenderness. There is no guarding or rebound.  Musculoskeletal:        General: No tenderness.     Cervical back: Normal range of motion and neck supple. No rigidity.  Lymphadenopathy:     Cervical: No cervical adenopathy.  Skin:    Findings: No erythema or rash.  Neurological:     Cranial Nerves: No cranial nerve deficit.     Motor: No abnormal muscle tone.     Coordination: Coordination normal.     Deep Tendon Reflexes: Reflexes normal.  Psychiatric:        Behavior: Behavior normal.        Thought Content: Thought content normal.        Judgment: Judgment normal.     Lab Results  Component Value Date   WBC 7.8 03/27/2023   HGB 12.5 03/27/2023   HCT 38.5 03/27/2023   PLT 290.0 03/27/2023   GLUCOSE 89 03/27/2023   CHOL 193 03/27/2023   TRIG 259.0 (H) 03/27/2023   HDL 52.90 03/27/2023   LDLDIRECT 114.0 03/27/2023   LDLCALC 94 03/17/2022   ALT 13 03/27/2023   AST 18 03/27/2023   NA 139 03/27/2023   K 4.5 03/27/2023   CL 103 03/27/2023   CREATININE 0.93 03/27/2023   BUN 19 03/27/2023   CO2 27 03/27/2023   TSH 1.17 11/22/2023   HGBA1C 6.0 03/27/2023   MICROALBUR <0.7 03/27/2023    MM 3D SCREEN BREAST BILATERAL Result Date: 04/26/2023 CLINICAL DATA:  Screening. EXAM: DIGITAL SCREENING BILATERAL MAMMOGRAM WITH  TOMOSYNTHESIS AND CAD TECHNIQUE: Bilateral screening digital craniocaudal and mediolateral oblique mammograms were obtained. Bilateral screening digital breast tomosynthesis was performed. The images were evaluated with computer-aided detection. COMPARISON:  Previous exam(s). ACR Breast Density Category a: The breasts are almost entirely fatty. FINDINGS: There are no findings suspicious for malignancy. IMPRESSION:  No mammographic evidence of malignancy. A result letter of this screening mammogram will be mailed directly to the patient. RECOMMENDATION: Screening mammogram in one year. (Code:SM-B-01Y) BI-RADS CATEGORY  1: Negative. Electronically Signed   By: Rosaline Collet M.D.   On: 04/26/2023 13:26    Assessment & Plan:   Problem List Items Addressed This Visit     Morbid obesity with BMI of 45.0-49.9, adult (HCC)   Stable effort Regular gym workouts      Relevant Medications   amphetamine -dextroamphetamine  (ADDERALL XR) 10 MG 24 hr capsule   amphetamine -dextroamphetamine  (ADDERALL XR) 10 MG 24 hr capsule   amphetamine -dextroamphetamine  (ADDERALL XR) 10 MG 24 hr capsule   Situational depression   Possibly bipolar depression Cymbalta  Clonazepam  prn  Potential benefits of a long term benzodiazepines  use as well as potential risks  and complications were explained to the patient and were aknowledged.      Relevant Orders   Comprehensive metabolic panel   CBC with Differential/Platelet   T4, free   TSH   Magnesium   Cortisol   Hemoglobin A1c   Urinalysis   Essential hypertension - Primary   Cont ToprolCont Toprol       Relevant Orders   Comprehensive metabolic panel   CBC with Differential/Platelet   T4, free   TSH   Magnesium   Cortisol   Hemoglobin A1c   Urinalysis   Asthma   Stable at present.  Cont Proair  prn      Weight gain   20 lbs wt gain      Relevant Orders   Comprehensive metabolic panel   CBC with Differential/Platelet   T4, free   TSH   Magnesium    Cortisol   Hemoglobin A1c   Urinalysis   Headache      Meds ordered this encounter  Medications   cefdinir  (OMNICEF ) 300 MG capsule    Sig: Take 1 capsule (300 mg total) by mouth 2 (two) times daily.    Dispense:  20 capsule    Refill:  0   amphetamine -dextroamphetamine  (ADDERALL XR) 10 MG 24 hr capsule    Sig: Take 1 capsule (10 mg total) by mouth daily.    Dispense:  30 capsule    Refill:  0    Please fill on or after 12/03/23   amphetamine -dextroamphetamine  (ADDERALL XR) 10 MG 24 hr capsule    Sig: Take 1 capsule (10 mg total) by mouth daily.    Dispense:  30 capsule    Refill:  0    Please fill on or after 01/02/24   amphetamine -dextroamphetamine  (ADDERALL XR) 10 MG 24 hr capsule    Sig: Take 1 capsule (10 mg total) by mouth daily.    Dispense:  30 capsule    Refill:  0    Please fill on or after 02/02/24      Follow-up: Return for a follow-up visit.  Marolyn Noel, MD

## 2023-11-20 NOTE — Assessment & Plan Note (Signed)
 20 lbs wt gain

## 2023-11-20 NOTE — Assessment & Plan Note (Signed)
Stable effort Regular gym workouts

## 2023-11-20 NOTE — Assessment & Plan Note (Signed)
Possibly bipolar depression Cymbalta Clonazepam prn  Potential benefits of a long term benzodiazepines  use as well as potential risks  and complications were explained to the patient and were aknowledged. 

## 2023-11-20 NOTE — Assessment & Plan Note (Addendum)
 Cont ToprolCont Toprol

## 2023-11-21 ENCOUNTER — Other Ambulatory Visit: Payer: Self-pay | Admitting: Internal Medicine

## 2023-11-22 ENCOUNTER — Other Ambulatory Visit: Payer: Self-pay | Admitting: Internal Medicine

## 2023-11-22 LAB — LAB REPORT - SCANNED
A1c: 6.7
EGFR: 92
TSH: 1.17 (ref 0.41–5.90)

## 2023-11-26 ENCOUNTER — Encounter: Payer: Self-pay | Admitting: Internal Medicine

## 2023-11-26 NOTE — Assessment & Plan Note (Signed)
 Stable at present.  Cont Proair prn

## 2023-11-29 ENCOUNTER — Telehealth: Payer: Self-pay

## 2023-11-29 NOTE — Telephone Encounter (Signed)
Copied from CRM [id number]. Topic: Clinical - Lab/Test Results >> Nov 29, 2023 11:07 AM Deaijah H wrote: Reason for CRM: Patient called in inquiring about lab results, adv results are not ready and once they do become ready they will be available in my chart, but patient would like for nurse or provider to give a call once ready / please call [phone]

## 2023-12-08 ENCOUNTER — Other Ambulatory Visit: Payer: Self-pay | Admitting: Internal Medicine

## 2023-12-23 ENCOUNTER — Other Ambulatory Visit: Payer: Self-pay | Admitting: Internal Medicine

## 2023-12-25 ENCOUNTER — Other Ambulatory Visit (HOSPITAL_COMMUNITY): Payer: Self-pay

## 2023-12-25 ENCOUNTER — Telehealth (HOSPITAL_COMMUNITY): Payer: Self-pay | Admitting: Pharmacy Technician

## 2023-12-25 NOTE — Telephone Encounter (Signed)
Pharmacy Patient Advocate Encounter   Received notification from Patient Pharmacy that prior authorization for amphetamine-dextroamphetamine 10 MG 24 hr capsule is required/requested.   Insurance verification completed.   The patient is insured through  [employer]   .   Per test claim: PA required; PA submitted to above mentioned insurance via CoverMyMeds Key/confirmation #/EOC [id number] Status is pending

## 2023-12-26 ENCOUNTER — Other Ambulatory Visit (HOSPITAL_COMMUNITY): Payer: Self-pay

## 2023-12-26 NOTE — Telephone Encounter (Signed)
Pharmacy Patient Advocate Encounter  Received notification from  [facility]  that Prior Authorization for  amphetamine-dextroamphetamine 10 MG 24 hr capsule  has been DENIED.  Full denial letter will be uploaded to the media tab. See denial reason below.   PA #/Case ID/Reference #: [id number]

## 2024-01-01 ENCOUNTER — Telehealth: Payer: Self-pay

## 2024-01-01 NOTE — Telephone Encounter (Signed)
 Copied from CRM [id number]. Topic: General - Other >> Jan 01, 2024  2:45 PM Truddie Crumble wrote: Reason for CRM: frances from [facility] called stating they need prior authorization for amphetamine-dextroamphetamine for the patient CB [phone] Fax [phone]

## 2024-01-02 ENCOUNTER — Other Ambulatory Visit (HOSPITAL_COMMUNITY): Payer: Self-pay

## 2024-01-02 NOTE — Telephone Encounter (Signed)
 PA request has been Denied. New Encounter created for follow up. For additional info see Pharmacy Prior Auth telephone encounter from 12/25/23.

## 2024-01-02 NOTE — Telephone Encounter (Signed)
 Denial letter in media:

## 2024-01-04 ENCOUNTER — Encounter: Payer: Self-pay | Admitting: Internal Medicine

## 2024-01-07 ENCOUNTER — Telehealth: Payer: Self-pay | Admitting: Pharmacist

## 2024-01-07 NOTE — Telephone Encounter (Signed)
 Information has been sent to clinical pharmacist for appeals review. It may take 5-7 days to prepare the necessary documentation to request the appeal from the insurance.

## 2024-01-07 NOTE — Telephone Encounter (Signed)
 Appeal has been submitted for generic Adderall. Will advise when response is received, please be advised that most companies may take 30 days to make a decision. Appeal letter and supporting documentation have been faxed to [phone] on 01/07/2024 @4 :38 pm.  Thank you, Dellie Burns, PharmD Clinical Pharmacist  [place]  Direct Dial: [phone]

## 2024-01-08 NOTE — Telephone Encounter (Signed)
 Copied from CRM [id number]. Topic: Clinical - Prescription Issue >> Jan 08, 2024 12:07 PM Ernst Spell wrote: Reason for CRM: Thelma Barge from [employer] [place] [employer] (insurance) called and stated that they need a prior auth for dextroamphetamine. She stated it was denied on the 11th, but they need the diagnosis and confirmation that the member has tried behavioral techniques before starting the medication. Please call and advise. Tele: [phone], Fax: [phone]   ---  For records only, appeal has already been started.

## 2024-01-22 ENCOUNTER — Other Ambulatory Visit: Payer: Self-pay | Admitting: Internal Medicine

## 2024-02-11 ENCOUNTER — Other Ambulatory Visit (HOSPITAL_COMMUNITY): Payer: Self-pay

## 2024-02-12 ENCOUNTER — Other Ambulatory Visit (HOSPITAL_COMMUNITY): Payer: Self-pay

## 2024-02-13 ENCOUNTER — Telehealth: Payer: Self-pay | Admitting: Pharmacist

## 2024-02-13 NOTE — Telephone Encounter (Signed)
 Per insurance, the appeal for generic adderall has been approved through 01/23/2025.

## 2024-02-15 ENCOUNTER — Other Ambulatory Visit: Payer: Self-pay

## 2024-02-15 NOTE — Telephone Encounter (Signed)
 Called and informed patient of appeal approval.

## 2024-03-03 ENCOUNTER — Other Ambulatory Visit (HOSPITAL_COMMUNITY): Payer: Self-pay

## 2024-03-20 ENCOUNTER — Other Ambulatory Visit: Payer: Self-pay | Admitting: Internal Medicine

## 2024-03-31 ENCOUNTER — Encounter: Payer: Self-pay | Admitting: Internal Medicine

## 2024-04-15 ENCOUNTER — Encounter: Payer: Self-pay | Admitting: Internal Medicine

## 2024-04-15 ENCOUNTER — Ambulatory Visit (INDEPENDENT_AMBULATORY_CARE_PROVIDER_SITE_OTHER): Admitting: Internal Medicine

## 2024-04-15 VITALS — BP 118/80 | HR 85 | Temp 97.7°F | Ht 67.0 in | Wt 319.0 lb

## 2024-04-15 DIAGNOSIS — Z8616 Personal history of COVID-19: Secondary | ICD-10-CM | POA: Diagnosis not present

## 2024-04-15 DIAGNOSIS — Z0000 Encounter for general adult medical examination without abnormal findings: Secondary | ICD-10-CM

## 2024-04-15 DIAGNOSIS — E1169 Type 2 diabetes mellitus with other specified complication: Secondary | ICD-10-CM

## 2024-04-15 DIAGNOSIS — R635 Abnormal weight gain: Secondary | ICD-10-CM

## 2024-04-15 DIAGNOSIS — I10 Essential (primary) hypertension: Secondary | ICD-10-CM

## 2024-04-15 DIAGNOSIS — Z7984 Long term (current) use of oral hypoglycemic drugs: Secondary | ICD-10-CM

## 2024-04-15 DIAGNOSIS — Z1231 Encounter for screening mammogram for malignant neoplasm of breast: Secondary | ICD-10-CM

## 2024-04-15 DIAGNOSIS — Z8249 Family history of ischemic heart disease and other diseases of the circulatory system: Secondary | ICD-10-CM

## 2024-04-15 DIAGNOSIS — F4321 Adjustment disorder with depressed mood: Secondary | ICD-10-CM | POA: Diagnosis not present

## 2024-04-15 DIAGNOSIS — E669 Obesity, unspecified: Secondary | ICD-10-CM

## 2024-04-15 DIAGNOSIS — E041 Nontoxic single thyroid nodule: Secondary | ICD-10-CM | POA: Insufficient documentation

## 2024-04-15 DIAGNOSIS — Z6841 Body Mass Index (BMI) 40.0 and over, adult: Secondary | ICD-10-CM

## 2024-04-15 DIAGNOSIS — Z1211 Encounter for screening for malignant neoplasm of colon: Secondary | ICD-10-CM

## 2024-04-15 LAB — COMPREHENSIVE METABOLIC PANEL WITH GFR
ALT: 34 U/L (ref 0–35)
AST: 42 U/L — ABNORMAL HIGH (ref 0–37)
Albumin: 4.1 g/dL (ref 3.5–5.2)
Alkaline Phosphatase: 63 U/L (ref 39–117)
BUN: 10 mg/dL (ref 6–23)
CO2: 29 meq/L (ref 19–32)
Calcium: 9.6 mg/dL (ref 8.4–10.5)
Chloride: 101 meq/L (ref 96–112)
Creatinine, Ser: 0.8 mg/dL (ref 0.40–1.20)
GFR: 85.74 mL/min (ref >60.00)
Glucose, Bld: 137 mg/dL — ABNORMAL HIGH (ref 70–99)
Potassium: 4.1 meq/L (ref 3.5–5.1)
Sodium: 137 meq/L (ref 135–145)
Total Bilirubin: 0.4 mg/dL (ref 0.2–1.2)
Total Protein: 7.1 g/dL (ref 6.0–8.3)

## 2024-04-15 LAB — MAGNESIUM: Magnesium: 2 mg/dL (ref 1.5–2.5)

## 2024-04-15 LAB — T4, FREE: Free T4: 0.83 ng/dL (ref 0.60–1.60)

## 2024-04-15 LAB — CBC WITH DIFFERENTIAL/PLATELET
Basophils Absolute: 0.1 10*3/uL (ref 0.0–0.1)
Basophils Relative: 0.9 % (ref 0.0–3.0)
Eosinophils Absolute: 0.4 10*3/uL (ref 0.0–0.7)
Eosinophils Relative: 6.8 % — ABNORMAL HIGH (ref 0.0–5.0)
HCT: 35.7 % — ABNORMAL LOW (ref 36.0–46.0)
Hemoglobin: 11.4 g/dL — ABNORMAL LOW (ref 12.0–15.0)
Lymphocytes Relative: 26.8 % (ref 12.0–46.0)
Lymphs Abs: 1.8 10*3/uL (ref 0.7–4.0)
MCHC: 32 g/dL (ref 30.0–36.0)
MCV: 74.1 fl — ABNORMAL LOW (ref 78.0–100.0)
Monocytes Absolute: 0.6 10*3/uL (ref 0.1–1.0)
Monocytes Relative: 9.6 % (ref 3.0–12.0)
Neutro Abs: 3.7 10*3/uL (ref 1.4–7.7)
Neutrophils Relative %: 55.9 % (ref 43.0–77.0)
Platelets: 254 10*3/uL (ref 150.0–400.0)
RBC: 4.82 Mil/uL (ref 3.87–5.11)
RDW: 19.1 % — ABNORMAL HIGH (ref 11.5–15.5)
WBC: 6.5 10*3/uL (ref 4.0–10.5)

## 2024-04-15 LAB — URINALYSIS
Bilirubin Urine: NEGATIVE
Hgb urine dipstick: NEGATIVE
Ketones, ur: NEGATIVE
Leukocytes,Ua: NEGATIVE
Nitrite: NEGATIVE
Specific Gravity, Urine: <=1.005 — AB (ref 1.000–1.030)
Total Protein, Urine: NEGATIVE
Urine Glucose: NEGATIVE
Urobilinogen, UA: 0.2 (ref 0.0–1.0)
pH: 6 (ref 5.0–8.0)

## 2024-04-15 LAB — HEMOGLOBIN A1C: Hgb A1c MFr Bld: 7.3 % — ABNORMAL HIGH (ref 4.6–6.5)

## 2024-04-15 LAB — CORTISOL: Cortisol, Plasma: 6.7 ug/dL

## 2024-04-15 LAB — TSH: TSH: 0.52 u[IU]/mL (ref 0.35–5.50)

## 2024-04-15 MED ORDER — METFORMIN HCL 500 MG PO TABS: 500.0000 mg | ORAL_TABLET | Freq: Every day | ORAL | 3 refills | Status: DC

## 2024-04-15 MED ORDER — AMPHETAMINE-DEXTROAMPHET ER 10 MG PO CP24: 10.0000 mg | ORAL_CAPSULE | Freq: Every day | ORAL | 0 refills | Status: DC

## 2024-04-15 MED ORDER — CEFDINIR 300 MG PO CAPS: 300.0000 mg | ORAL_CAPSULE | Freq: Two times a day (BID) | ORAL | 0 refills | Status: DC

## 2024-04-15 MED ORDER — DULOXETINE HCL 30 MG PO CPEP: 30.0000 mg | ORAL_CAPSULE | Freq: Every day | ORAL | 1 refills | Status: DC

## 2024-04-15 NOTE — Assessment & Plan Note (Signed)
 On Adderall XL Stable effort Regular gym workouts

## 2024-04-15 NOTE — Assessment & Plan Note (Signed)
Checked A1c at work -5.9% On Metformin po Trulicity is not covered well - $$$ RTC 3 mo

## 2024-04-15 NOTE — Assessment & Plan Note (Signed)
 We discussed age appropriate health related issues, including available/recomended screening tests and vaccinations. Labs were ordered to be later reviewed . All questions were answered. We discussed one or more of the following - seat belt use, use of sunscreen/sun exposure exercise, safe sex, fall risk reduction, second hand smoke exposure, firearm use and storage, seat belt use, a need for adhering to healthy diet and exercise. Labs were ordered.  All questions were answered. Colon is advised. Cologuard discusussed - opted by the pt. Coronary calcium CT score of 0 in 2021.  Cologuard ordered

## 2024-04-15 NOTE — Assessment & Plan Note (Signed)
Schedule US

## 2024-04-15 NOTE — Assessment & Plan Note (Signed)
 Cont ToprolCont Toprol

## 2024-04-15 NOTE — Assessment & Plan Note (Signed)
 Coronary calcium CT score of 0 in 2021.

## 2024-04-15 NOTE — Assessment & Plan Note (Signed)
 Recovered

## 2024-04-15 NOTE — Progress Notes (Signed)
 Subjective:  Patient ID: Sylvia Alvarado, female    DOB: Apr 19, 1971  Age: 53 y.o. MRN: [id number]  CC: Annual Exam   HPI Sylvia Alvarado presents for a well exam C/o allergies x 2 d Pt had COVID in April C/o a lump in the neck x 6 months  Outpatient Medications Prior to Visit  Medication Sig Dispense Refill   ADVAIR DISKUS 100-50 MCG/ACT AEPB INHALE 1 PUFF into THE lungs TWICE DAILY 60 each 11   albuterol  (VENTOLIN  HFA) 108 (90 Base) MCG/ACT inhaler Inhale 2 puffs into the lungs every 6 (six) hours as needed for wheezing or shortness of breath. 18 g 11   B Complex-C (SUPER B COMPLEX PO) Take by mouth.     Cholecalciferol (VITAMIN D3) 50 MCG (2000 UT) capsule Take 1 capsule (2,000 Units total) by mouth daily. 100 capsule 3   clonazePAM  (KLONOPIN ) 1 MG tablet TAKE 1 TABLET BY MOUTH TWICE DAILY AS NEEDED 60 tablet 1   cyclobenzaprine  (FLEXERIL ) 10 MG tablet TAKE 1 TABLET BY MOUTH TWICE DAILY 60 tablet 3   fluticasone  (FLONASE ) 50 MCG/ACT nasal spray PLACE TWO SPRAYS INTO BOTH NOSTRILS DAILY 16 g 6   furosemide  (LASIX ) 20 MG tablet Take 1 tablet (20 mg total) by mouth daily as needed. 30 tablet 3   meloxicam  (MOBIC ) 15 MG tablet TAKE 1 TABLET BY MOUTH EVERY DAY 30 tablet 2   metoprolol  succinate (TOPROL -XL) 25 MG 24 hr tablet TAKE 1 TABLET BY MOUTH EVERY DAY 90 tablet 2   pantoprazole  (PROTONIX ) 40 MG tablet TAKE 1 TABLET BY MOUTH DAILY 90 tablet 3   triamcinolone  cream (KENALOG ) 0.1 % APPLY TO THE AFFECTED AREA(S) FOUR TIMES DAILY 80 g 0   amphetamine -dextroamphetamine  (ADDERALL XR) 10 MG 24 hr capsule Take 1 capsule (10 mg total) by mouth daily. 30 capsule 0   amphetamine -dextroamphetamine  (ADDERALL XR) 10 MG 24 hr capsule Take 1 capsule (10 mg total) by mouth daily. 30 capsule 0   amphetamine -dextroamphetamine  (ADDERALL XR) 10 MG 24 hr capsule Take 1 capsule (10 mg total) by mouth daily. 30 capsule 0   DULoxetine  (CYMBALTA ) 30 MG capsule TAKE ONE CAPSULE BY MOUTH DAILY 90 capsule 1    metFORMIN  (GLUCOPHAGE ) 500 MG tablet TAKE 1 TABLET BY MOUTH DAILY WITH BREAKFAST (annual APPOINTMENT due in MAY must see provider FOR future refills) 90 tablet 3   cefdinir  (OMNICEF ) 300 MG capsule Take 1 capsule (300 mg total) by mouth 2 (two) times daily. (Patient not taking: Reported on 04/15/2024) 20 capsule 0   No facility-administered medications prior to visit.    ROS: Review of Systems  Constitutional:  Positive for fatigue. Negative for activity change, appetite change, chills and unexpected weight change.  HENT:  Positive for congestion, postnasal drip, sinus pressure and voice change. Negative for mouth sores and sore throat.   Eyes:  Negative for photophobia and visual disturbance.  Respiratory:  Negative for cough and chest tightness.   Gastrointestinal:  Negative for abdominal pain and nausea.  Genitourinary:  Negative for difficulty urinating, frequency and vaginal pain.  Musculoskeletal:  Negative for back pain, gait problem and neck pain.  Skin:  Negative for pallor and rash.  Neurological:  Positive for headaches. Negative for dizziness, tremors, weakness and numbness.  Psychiatric/Behavioral:  Positive for dysphoric mood. Negative for confusion, sleep disturbance and suicidal ideas.     Objective:  BP 118/80   Pulse 85   Temp 97.7 F (36.5 C) (Oral)   Ht 5\' 7"  (  1.702 m)   Wt (!) 319 lb (144.7 kg)   SpO2 97%   BMI 49.96 kg/m   BP Readings from Last 3 Encounters:  04/15/24 118/80  11/20/23 120/70  03/28/23 126/87    Wt Readings from Last 3 Encounters:  04/15/24 (!) 319 lb (144.7 kg)  11/20/23 (!) 320 lb (145.2 kg)  03/28/23 (!) 304 lb (137.9 kg)    Physical Exam Constitutional:      General: She is not in acute distress.    Appearance: She is well-developed. She is obese.  HENT:     Head: Normocephalic.     Right Ear: External ear normal.     Left Ear: External ear normal.     Nose: Congestion and rhinorrhea present.     Mouth/Throat:     Pharynx:  Posterior oropharyngeal erythema present. No oropharyngeal exudate.  Eyes:     General:        Right eye: No discharge.        Left eye: No discharge.     Conjunctiva/sclera: Conjunctivae normal.     Pupils: Pupils are equal, round, and reactive to light.  Neck:     Thyroid : No thyromegaly.     Vascular: No JVD.     Trachea: No tracheal deviation.  Cardiovascular:     Rate and Rhythm: Normal rate and regular rhythm.     Heart sounds: Normal heart sounds.  Pulmonary:     Effort: No respiratory distress.     Breath sounds: No stridor. No wheezing.  Abdominal:     General: Bowel sounds are normal. There is no distension.     Palpations: Abdomen is soft. There is no mass.     Tenderness: There is no abdominal tenderness. There is no guarding or rebound.  Musculoskeletal:        General: No tenderness.     Cervical back: Normal range of motion and neck supple. No rigidity.  Lymphadenopathy:     Cervical: No cervical adenopathy.  Skin:    Findings: No erythema or rash.  Neurological:     Mental Status: She is oriented to person, place, and time.     Cranial Nerves: No cranial nerve deficit.     Motor: No abnormal muscle tone.     Coordination: Coordination normal.     Gait: Gait normal.     Deep Tendon Reflexes: Reflexes normal.  Psychiatric:        Behavior: Behavior normal.        Thought Content: Thought content normal.        Judgment: Judgment normal.    R thyroid  nodule 4x4 cn NT   Lab Results  Component Value Date   WBC 7.8 03/27/2023   HGB 12.5 03/27/2023   HCT 38.5 03/27/2023   PLT 290.0 03/27/2023   GLUCOSE 89 03/27/2023   CHOL 193 03/27/2023   TRIG 259.0 (H) 03/27/2023   HDL 52.90 03/27/2023   LDLDIRECT 114.0 03/27/2023   LDLCALC 94 03/17/2022   ALT 13 03/27/2023   AST 18 03/27/2023   NA 139 03/27/2023   K 4.5 03/27/2023   CL 103 03/27/2023   CREATININE 0.93 03/27/2023   BUN 19 03/27/2023   CO2 27 03/27/2023   TSH 1.17 11/22/2023   HGBA1C 6.0  03/27/2023   MICROALBUR <0.7 03/27/2023    MM 3D SCREEN BREAST BILATERAL Result Date: 04/26/2023 CLINICAL DATA:  Screening. EXAM: DIGITAL SCREENING BILATERAL MAMMOGRAM WITH TOMOSYNTHESIS AND CAD TECHNIQUE: Bilateral screening digital craniocaudal and mediolateral  oblique mammograms were obtained. Bilateral screening digital breast tomosynthesis was performed. The images were evaluated with computer-aided detection. COMPARISON:  Previous exam(s). ACR Breast Density Category a: The breasts are almost entirely fatty. FINDINGS: There are no findings suspicious for malignancy. IMPRESSION: No mammographic evidence of malignancy. A result letter of this screening mammogram will be mailed directly to the patient. RECOMMENDATION: Screening mammogram in one year. (Code:SM-B-01Y) BI-RADS CATEGORY  1: Negative. Electronically Signed   By: Alinda Apley M.D.   On: 04/26/2023 13:26    Assessment & Plan:   Problem List Items Addressed This Visit     Morbid obesity with BMI of 45.0-49.9, adult (HCC)   On Adderall XL Stable effort Regular gym workouts      Relevant Medications   amphetamine -dextroamphetamine  (ADDERALL XR) 10 MG 24 hr capsule   metFORMIN  (GLUCOPHAGE ) 500 MG tablet   amphetamine -dextroamphetamine  (ADDERALL XR) 10 MG 24 hr capsule   amphetamine -dextroamphetamine  (ADDERALL XR) 10 MG 24 hr capsule   Situational depression   Possibly bipolar depression Cymbalta  Clonazepam  prn  Potential benefits of a long term benzodiazepines  use as well as potential risks  and complications were explained to the patient and were aknowledged.      Relevant Medications   DULoxetine  (CYMBALTA ) 30 MG capsule   Essential hypertension   Cont ToprolCont Toprol       Well adult exam   We discussed age appropriate health related issues, including available/recomended screening tests and vaccinations. Labs were ordered to be later reviewed . All questions were answered. We discussed one or more of the  following - seat belt use, use of sunscreen/sun exposure exercise, safe sex, fall risk reduction, second hand smoke exposure, firearm use and storage, seat belt use, a need for adhering to healthy diet and exercise. Labs were ordered.  All questions were answered. Colon is advised. Cologuard discusussed - opted by the pt. Coronary calcium CT score of 0 in 2021.  Cologuard ordered      Family history of early CAD   Coronary calcium CT score of 0 in 2021.       Type 2 diabetes mellitus with obesity (HCC)   Checked A1c at work -5.9% On Metformin  po Trulicity  is not covered well - $$$ RTC 3 mo      Relevant Medications   metFORMIN  (GLUCOPHAGE ) 500 MG tablet   History of COVID-19 - Primary   Recovered      Thyroid  nodule   Schedule US       Relevant Orders   US  THYROID    Other Visit Diagnoses       Screening for colon cancer       Relevant Orders   Cologuard     Screening mammogram for breast cancer       Relevant Orders   MM DIGITAL SCREENING BILATERAL         Meds ordered this encounter  Medications   amphetamine -dextroamphetamine  (ADDERALL XR) 10 MG 24 hr capsule    Sig: Take 1 capsule (10 mg total) by mouth daily.    Dispense:  30 capsule    Refill:  0    Please fill on or after 06/14/24   metFORMIN  (GLUCOPHAGE ) 500 MG tablet    Sig: Take 1 tablet (500 mg total) by mouth daily with breakfast.    Dispense:  90 tablet    Refill:  3   DULoxetine  (CYMBALTA ) 30 MG capsule    Sig: Take 1 capsule (30 mg total) by mouth daily.  Dispense:  90 capsule    Refill:  1    This prescription was filled on 12/03/2023. Any refills authorized will be placed on file.   amphetamine -dextroamphetamine  (ADDERALL XR) 10 MG 24 hr capsule    Sig: Take 1 capsule (10 mg total) by mouth daily.    Dispense:  30 capsule    Refill:  0    Please fill on or after 04/15/24   amphetamine -dextroamphetamine  (ADDERALL XR) 10 MG 24 hr capsule    Sig: Take 1 capsule (10 mg total) by mouth daily.     Dispense:  30 capsule    Refill:  0    Please fill on or after 05/15/24   cefdinir  (OMNICEF ) 300 MG capsule    Sig: Take 1 capsule (300 mg total) by mouth 2 (two) times daily.    Dispense:  20 capsule    Refill:  0      Follow-up: No follow-ups on file.  Anitra Barn, MD

## 2024-04-15 NOTE — Assessment & Plan Note (Signed)
Possibly bipolar depression Cymbalta Clonazepam prn  Potential benefits of a long term benzodiazepines  use as well as potential risks  and complications were explained to the patient and were aknowledged. 

## 2024-04-16 ENCOUNTER — Ambulatory Visit: Payer: Self-pay | Admitting: Internal Medicine

## 2024-04-16 ENCOUNTER — Other Ambulatory Visit: Payer: Self-pay | Admitting: Internal Medicine

## 2024-04-16 MED ORDER — METFORMIN HCL 500 MG PO TABS: 500.0000 mg | ORAL_TABLET | Freq: Two times a day (BID) | ORAL | 3 refills | Status: AC

## 2024-04-19 ENCOUNTER — Other Ambulatory Visit: Payer: Self-pay | Admitting: Internal Medicine

## 2024-04-19 DIAGNOSIS — D649 Anemia, unspecified: Secondary | ICD-10-CM

## 2024-04-26 ENCOUNTER — Encounter: Payer: Self-pay | Admitting: Internal Medicine

## 2024-04-26 LAB — COLOGUARD: COLOGUARD: NEGATIVE

## 2024-05-09 ENCOUNTER — Ambulatory Visit (HOSPITAL_COMMUNITY)
Admission: RE | Admit: 2024-05-09 | Discharge: 2024-05-09 | Disposition: A | Discharge status: Home / Self Care | Source: Ambulatory Visit | Attending: Internal Medicine | Admitting: Internal Medicine

## 2024-05-09 DIAGNOSIS — E041 Nontoxic single thyroid nodule: Secondary | ICD-10-CM | POA: Insufficient documentation

## 2024-05-12 ENCOUNTER — Encounter: Payer: Self-pay | Admitting: Internal Medicine

## 2024-05-12 ENCOUNTER — Ambulatory Visit: Payer: Self-pay | Admitting: Internal Medicine

## 2024-05-12 DIAGNOSIS — E049 Nontoxic goiter, unspecified: Secondary | ICD-10-CM | POA: Insufficient documentation

## 2024-05-13 ENCOUNTER — Other Ambulatory Visit: Payer: Self-pay | Admitting: Internal Medicine

## 2024-05-13 DIAGNOSIS — E049 Nontoxic goiter, unspecified: Secondary | ICD-10-CM

## 2024-05-18 ENCOUNTER — Other Ambulatory Visit: Payer: Self-pay | Admitting: Internal Medicine

## 2024-05-26 ENCOUNTER — Encounter: Payer: Self-pay | Admitting: Adult Health

## 2024-05-26 ENCOUNTER — Other Ambulatory Visit (HOSPITAL_COMMUNITY)
Admission: RE | Admit: 2024-05-26 | Discharge: 2024-05-26 | Disposition: A | Discharge status: Home / Self Care | Source: Ambulatory Visit | Attending: Adult Health | Admitting: Adult Health

## 2024-05-26 ENCOUNTER — Ambulatory Visit: Admitting: Adult Health

## 2024-05-26 VITALS — BP 134/82 | HR 82 | Ht 67.0 in | Wt 317.0 lb

## 2024-05-26 DIAGNOSIS — Z1331 Encounter for screening for depression: Secondary | ICD-10-CM

## 2024-05-26 DIAGNOSIS — N816 Rectocele: Secondary | ICD-10-CM

## 2024-05-26 DIAGNOSIS — R102 Pelvic and perineal pain: Secondary | ICD-10-CM

## 2024-05-26 DIAGNOSIS — Z0000 Encounter for general adult medical examination without abnormal findings: Secondary | ICD-10-CM

## 2024-05-26 DIAGNOSIS — E049 Nontoxic goiter, unspecified: Secondary | ICD-10-CM

## 2024-05-26 DIAGNOSIS — Z1211 Encounter for screening for malignant neoplasm of colon: Secondary | ICD-10-CM

## 2024-05-26 DIAGNOSIS — R87810 Cervical high risk human papillomavirus (HPV) DNA test positive: Secondary | ICD-10-CM

## 2024-05-26 DIAGNOSIS — R232 Flushing: Secondary | ICD-10-CM

## 2024-05-26 DIAGNOSIS — R140 Abdominal distension (gaseous): Secondary | ICD-10-CM

## 2024-05-26 LAB — HEMOCCULT GUIAC POC 1CARD (OFFICE): Fecal Occult Blood, POC: NEGATIVE

## 2024-05-26 NOTE — Progress Notes (Signed)
 Patient ID: KATREENA SCHUPP, female   DOB: Jun 19, 1971, 53 y.o.   MRN: [id number] History of Present Illness: Winna is a 53 year old white female,married, G1P1 in for a well woman gyn exam and pap. Her last pap was NILM +HPV, and had colpo 03/28/23 negative. She is having hot flashes, night sweats and not sleeping well and feels bloated. She has been diagnosed with a goiter recently and see ENT in September.  Sometimes after eating may get headache and feels sick, nose stuffy. PCP is Dr Garald   Current Medications, Allergies, Past Medical History, Past Surgical History, Family History and Social History were reviewed in [employer] record.     Review of Systems: Patient denies any  daily headaches, hearing loss, fatigue, blurred vision, shortness of breath, chest pain, abdominal pain, problems with bowel movements, urination, or intercourse. No joint pain or mood swings.  See HPI for positives.   Physical Exam:BP 134/82 (BP Location: Right Arm, Patient Position: Sitting, Cuff Size: Large)   Pulse 82   Ht 5' 7 (1.702 m)   Wt (!) 317 lb (143.8 kg)   BMI 49.65 kg/m   General:  Well developed, well nourished, no acute distress Skin:  Warm and dry Neck:  Midline trachea, + thyroid  goiter,  good ROM, no lymphadenopathy Lungs; Clear to auscultation bilaterally Breast:  No dominant palpable mass, retraction, or nipple discharge Cardiovascular: Regular rate and rhythm Abdomen:  Soft, non tender, no hepatosplenomegaly,obese Pelvic:  External genitalia is normal in appearance, no lesions.  The vagina is normal in appearance. Urethra has no lesions or masses. The cervix is smooth, pap with HR HPV genotyping performed.  Uterus is felt to be normal size, shape, and contour,mildly tender.  No adnexal masses, + tenderness noted.Bladder is non tender, no masses felt. Rectal: Good sphincter tone, no polyps, or hemorrhoids felt.  Hemoccult  negative.+rectocele. Extremities/musculoskeletal:  No swelling or varicosities noted, no clubbing or cyanosis Psych:  No mood changes, alert and cooperative,seems happy AA is 1 Fall risk is low    05/26/2024    4:07 PM 03/15/2023    3:39 PM 10/11/2022    8:06 AM  Depression screen PHQ 2/9  Decreased Interest 1 1 0  Down, Depressed, Hopeless 1 1 0  PHQ - 2 Score 2 2 0  Altered sleeping 1 1 1   Tired, decreased energy 1 1 0  Change in appetite 1 1 0  Feeling bad or failure about yourself  1 1 1   Trouble concentrating 1 1 0  Moving slowly or fidgety/restless 0 0 0  Suicidal thoughts 0 0 0  PHQ-9 Score 7 7 2   Difficult doing work/chores   Not difficult at all   On meds    05/26/2024    4:07 PM 03/15/2023    3:40 PM 03/03/2022    9:37 AM  GAD 7 : Generalized Anxiety Score  Nervous, Anxious, on Edge 1 1 1   Control/stop worrying 1 1 0  Worry too much - different things 1 1 0  Trouble relaxing 1 1 0  Restless 0 1 0  Easily annoyed or irritable 1 1 0  Afraid - awful might happen 0 1 0  Total GAD 7 Score 5 7 1     Upstream - 05/26/24 1602       Pregnancy Intention Screening   Does the patient want to become pregnant in the next year? N/A    Does the patient's partner want to become pregnant in the  next year? N/A    Would the patient like to discuss contraceptive options today? N/A      Contraception Wrap Up   Current Method Female Sterilization    End Method Female Sterilization    Contraception Counseling Provided No           Examination chaperoned by Clarita Salt LPN  Impression and Plan: 1. Routine general medical examination at a health care facility (Primary) Pap sent Physical in 1 year Mammogram was negative 04/24/23 Cologuard was negative 04/21/2024 - Cytology - PAP( [facility])  2. Encounter for screening fecal occult blood testing Hemoccult was negative   3. Hot flashes Check FSH  - Follicle stimulating hormone  4. Cervical high risk HPV (human  papillomavirus) test positive Pap sent   5. Rectocele  6. Thyroid  goiter Sees ENT in September   7. Tenderness of female pelvic organs Pelvic US  06/04/24 at [facility] at 3;30 pm to assess tenderness and bloating  Will discuss when results back  - US  PELVIC COMPLETE WITH TRANSVAGINAL; Future  8. Bloating symptom US  06/04/24 at [facility]  - US  PELVIC COMPLETE WITH TRANSVAGINAL; Future

## 2024-05-27 ENCOUNTER — Ambulatory Visit: Payer: Self-pay | Admitting: Adult Health

## 2024-05-27 LAB — FOLLICLE STIMULATING HORMONE: FSH: 37 m[IU]/mL

## 2024-06-02 ENCOUNTER — Encounter: Payer: Self-pay | Admitting: Women's Health

## 2024-06-02 DIAGNOSIS — R87619 Unspecified abnormal cytological findings in specimens from cervix uteri: Secondary | ICD-10-CM | POA: Insufficient documentation

## 2024-06-02 LAB — CYTOLOGY - PAP
Comment: NEGATIVE
Diagnosis: UNDETERMINED — AB
High risk HPV: NEGATIVE

## 2024-06-04 ENCOUNTER — Ambulatory Visit (HOSPITAL_COMMUNITY)
Admission: RE | Admit: 2024-06-04 | Discharge: 2024-06-04 | Disposition: A | Discharge status: Home / Self Care | Source: Ambulatory Visit | Attending: Adult Health | Admitting: Adult Health

## 2024-06-04 DIAGNOSIS — R140 Abdominal distension (gaseous): Secondary | ICD-10-CM | POA: Diagnosis present

## 2024-06-04 DIAGNOSIS — R102 Pelvic and perineal pain: Secondary | ICD-10-CM | POA: Diagnosis present

## 2024-06-09 ENCOUNTER — Encounter: Admitting: Obstetrics & Gynecology

## 2024-06-19 ENCOUNTER — Other Ambulatory Visit: Payer: Self-pay | Admitting: Internal Medicine

## 2024-06-25 ENCOUNTER — Ambulatory Visit: Admitting: Women's Health

## 2024-06-25 ENCOUNTER — Encounter: Payer: Self-pay | Admitting: Women's Health

## 2024-06-25 ENCOUNTER — Other Ambulatory Visit (HOSPITAL_COMMUNITY)
Admission: RE | Admit: 2024-06-25 | Discharge: 2024-06-25 | Disposition: A | Discharge status: Home / Self Care | Source: Ambulatory Visit | Attending: Women's Health | Admitting: Women's Health

## 2024-06-25 VITALS — BP 132/87 | Ht 67.0 in | Wt 312.0 lb

## 2024-06-25 DIAGNOSIS — R87612 Low grade squamous intraepithelial lesion on cytologic smear of cervix (LGSIL): Secondary | ICD-10-CM

## 2024-06-25 DIAGNOSIS — R87610 Atypical squamous cells of undetermined significance on cytologic smear of cervix (ASC-US): Secondary | ICD-10-CM

## 2024-06-25 NOTE — Progress Notes (Signed)
   COLPOSCOPY PROCEDURE NOTE Patient name: Sylvia Alvarado MRN [id number]  Date of birth: 05-Jan-1971 Subjective Findings:   Sylvia Alvarado is a 53 y.o. G1P1 Caucasian female being seen today for a colposcopy. Indication: Abnormal pap on 05/26/24: ASCUS w/ HRHPV negative  Prior cytology:  Date Result Procedure  03/15/23 NILM w/ HRHPV positive: other (not 16, 18/45) Colposcopy: CIN1  2023 ASCUS w/ HRHPV negative None  2021 ASCUS w/ HRHPV negative None  2020 LSIL w/ HRHPV positive: type not specified   No LMP recorded. Patient has had an ablation. Contraception: tubal ligation. Menopausal: has had ablation, is having menopausal sx, has seen Jenn. Hysterectomy: no.   Considering pregnancy: No  Smoker: no. Immunocompromised: no.   The risks and benefits were explained and informed consent was obtained, and written copy is in chart. Pertinent History Reviewed:   Reviewed past medical,surgical, social, obstetrical and family history.  Reviewed problem list, medications and allergies. Objective Findings & Procedure:   Vitals:   06/25/24 1035  BP: 132/87  Weight: (!) 312 lb (141.5 kg)  Height: 5' 7 (1.702 m)  Body mass index is 48.87 kg/m.  No results found for this or any previous visit (from the past 24 hours).   Time out was performed.  Speculum placed in the vagina, cervix fully visualized. SCJ: not fully visualized. Cervix swabbed x 3 with acetic acid.  Acetowhitening present: small area 10 o'clock Cervix: no visible lesions, no mosaicism, no punctation, no abnormal vasculature, and acetowhite lesion(s) noted at 10 o'clock. Endocervical curettage performed, Cervical biopsies taken at 10 o'clock, and Hemostasis achieved with Monsel's solution. Vagina: vaginal colposcopy not performed Vulva: vulvar colposcopy not performed  Specimens: 2  Complications: none  Chaperone: Winton Cherry  Colposcopic Impression & Plan:   Colposcopy findings consistent with LSIL Plan: Post biopsy  instructions given, Will notify patient of results when back, and Will base plan of care on pathology results and ASCCP guidelines  Return in about 1 year (around 06/25/2025) for Pap & physical.  Suzen JONELLE Fetters CNM, [facility] 06/25/2024 11:24 AM

## 2024-06-25 NOTE — Patient Instructions (Signed)
 Colposcopy, Care After  The following information offers guidance on how to care for yourself after your procedure. Your health care provider may also give you more specific instructions. If you have problems or questions, contact your health care provider. What can I expect after the procedure? If you had a colposcopy without a biopsy, you can expect to feel fine right away after your procedure. However, you may have some spotting of blood for a few days. You can return to your normal activities. If you had a colposcopy with a biopsy, it is common after the procedure to have: Soreness and mild pain. These may last for a few days. Mild vaginal bleeding or discharge that is dark-colored and grainy. This may last for a few days. The discharge may be caused by a liquid (solution) that was used during the procedure. You may need to wear a sanitary pad during this time. Spotting of blood for at least 48 hours after the procedure. Follow these instructions at home: Medicines Take over-the-counter and prescription medicines only as told by your health care provider. Talk with your health care provider about what type of over-the-counter pain medicines and prescription medicines you can start to take again. It is especially important to talk with your health care provider if you take blood thinners. Activity Avoid using douche products, using tampons, and having sex for at least 3 days after the procedure or for as long as told by your health care provider. Return to your normal activities as told by your health care provider. Ask your health care provider what activities are safe for you. General instructions Ask your health care provider if you may take baths, swim, or use a hot tub. You may take showers. If you use birth control (contraception), continue to use it. Keep all follow-up visits. This is important. Contact a health care provider if: You have a fever or chills. You faint or feel  light-headed. Get help right away if: You have heavy bleeding from your vagina or pass blood clots. Heavy bleeding is bleeding that soaks through a sanitary pad in less than 1 hour. You have vaginal discharge that is abnormal, is yellow in color, or smells bad. This could be a sign of infection. You have severe pain or cramps in your lower abdomen that do not go away with medicine. Summary If you had a colposcopy without a biopsy, you can expect to feel fine right away, but you may have some spotting of blood for a few days. You can return to your normal activities. If you had a colposcopy with a biopsy, it is common to have mild pain for a few days and spotting for 48 hours after the procedure. Avoid using douche products, using tampons, and having sex for at least 3 days after the procedure or for as long as told by your health care provider. Get help right away if you have heavy bleeding, severe pain, or signs of infection. This information is not intended to replace advice given to you by your health care provider. Make sure you discuss any questions you have with your health care provider. Document Revised: 03/27/2021 Document Reviewed: 03/27/2021 Elsevier Patient Education  2024 [employer].

## 2024-06-25 NOTE — Addendum Note (Signed)
 Addended by: SANNA GONG A on: 06/25/2024 11:49 AM   Modules accepted: Orders

## 2024-06-27 LAB — SURGICAL PATHOLOGY

## 2024-06-30 ENCOUNTER — Ambulatory Visit: Payer: Self-pay | Admitting: Women's Health

## 2024-07-17 ENCOUNTER — Other Ambulatory Visit: Payer: Self-pay | Admitting: Internal Medicine

## 2024-07-20 ENCOUNTER — Other Ambulatory Visit: Payer: Self-pay | Admitting: Internal Medicine

## 2024-07-25 ENCOUNTER — Encounter (INDEPENDENT_AMBULATORY_CARE_PROVIDER_SITE_OTHER): Payer: Self-pay | Admitting: Otolaryngology

## 2024-07-25 ENCOUNTER — Ambulatory Visit (INDEPENDENT_AMBULATORY_CARE_PROVIDER_SITE_OTHER): Admitting: Otolaryngology

## 2024-07-25 VITALS — BP 140/86 | HR 75

## 2024-07-25 DIAGNOSIS — R29898 Other symptoms and signs involving the musculoskeletal system: Secondary | ICD-10-CM

## 2024-07-25 DIAGNOSIS — R09A2 Foreign body sensation, throat: Secondary | ICD-10-CM

## 2024-07-25 DIAGNOSIS — E049 Nontoxic goiter, unspecified: Secondary | ICD-10-CM | POA: Diagnosis not present

## 2024-07-25 NOTE — Progress Notes (Signed)
 Patient is having BP managed but was worried about this appointment.

## 2024-07-25 NOTE — Progress Notes (Signed)
 ENT CONSULT:  Reason for Consult: thyroid  goiter   HPI: Discussed the use of AI scribe software for clinical note transcription with the patient, who gave verbal consent to proceed.  History of Present Illness Sylvia Alvarado is a 53 year old female who presents with symptoms of neck pressure and difficulty swallowing due to an enlarged thyroid  gland. She was referred by her primary care doctor for evaluation of a potentially large thyroid  gland requiring surgery.  She experiences neck pressure and difficulty swallowing, sometimes choking on foods and feeling tightness in her neck. She cannot wear certain shirts or necklaces due to the sensation of pressure on her neck.  An ultrasound indicated an enlarged thyroid  gland without discrete nodules. Thyroid  function tests have been normal.  Her family history includes her mother having a thyroid  condition requiring surgery and many family members taking thyroid  medication. Heart issues run in her family, but she denies any personal history of heart attacks or strokes. A calcium test done approximately five years ago showed a score of zero.  Records Reviewed:  PCP OV with Dr Garald 04/15/24 C/o lump in her throat x 6 mo  Thyroid  U/S ordered     Past Medical History:  Diagnosis Date   Abnormal Papanicolaou smear of cervix with positive human papilloma virus (HPV) test 12/17/2018   LSIL +HPV, will schedule colpo   Allergy    Asthma    Cervical high risk HPV (human papillomavirus) test positive 08/20/2017   Depression    Diabetes mellitus type 2 in obese 06/15/2021   Goiter    Hypertension    Low back pain    Obesity    OSA (obstructive sleep apnea)    on CPAP Dr Corrie   Vaginal Pap smear, abnormal     Past Surgical History:  Procedure Laterality Date   ARTHROSCOPY KNEE W/ DRILLING     BREAST REDUCTION SURGERY  1993   ENDOMETRIAL ABLATION     REDUCTION MAMMAPLASTY Bilateral    TUBAL LIGATION      Family History  Problem  Relation Age of Onset   Cancer Mother        Uterine   Stroke Mother    Heart disease Mother 38   Emphysema Father    Heart disease Father    Emphysema Sister    Endometriosis Sister    Breast cancer Maternal Grandmother    Cancer Maternal Grandmother 64       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 Pt's mom has a 50% chance - not tested yet.    Fibroids Maternal Grandmother    Cancer Maternal Grandfather        Brain   Arthritis Other    Heart disease Other        A Fib    Social History:  reports that she has quit smoking. She has never used smokeless tobacco. She reports current alcohol use. She reports that she does not use drugs.  Allergies:  Allergies  Allergen Reactions   Ivp Dye [Iodinated Contrast Media] Hives and Shortness Of Breath   Butrans  [Buprenorphine ]     Nausea, dizziness   Ropinirole Hydrochloride Other (See Comments)    Mood changes and sleep walking    Medications: I have reviewed the patient's current medications.  The PMH, PSH, Medications, Allergies, and SH were reviewed and updated.  ROS: Constitutional: Negative for fever, weight loss and weight gain.  Cardiovascular: Negative for chest pain and dyspnea on exertion. Respiratory: Is not experiencing shortness of breath at rest. Gastrointestinal: Negative for nausea and vomiting. Neurological: Negative for headaches. Psychiatric: The patient is not nervous/anxious  Blood pressure (!) 140/86, pulse 75, SpO2 95%. There is no height or weight on file to calculate BMI.  PHYSICAL EXAM:  Exam: General: Well-developed, well-nourished Respiratory Respiratory effort: Equal inspiration and expiration without stridor Cardiovascular Peripheral Vascular: Warm extremities with equal color/perfusion Eyes: No nystagmus with equal extraocular motion bilaterally Neuro/Psych/Balance: Patient oriented to  person, place, and time; Appropriate mood and affect; Gait is intact with no imbalance; Cranial nerves I-XII are intact Head and Face Inspection: Normocephalic and atraumatic without mass or lesion Palpation: Facial skeleton intact without bony stepoffs Salivary Glands: No mass or tenderness Facial Strength: Facial motility symmetric and full bilaterally ENT Pinna: External ear intact and fully developed External canal: Canal is patent with intact skin Tympanic Membrane: Clear and mobile External Nose: No scar or anatomic deformity Internal Nose: Septum is straight. No polyp, or purulence. Mucosal edema and erythema present.  Bilateral inferior turbinate hypertrophy.  Lips, Teeth, and gums: Mucosa and teeth intact and viable TMJ: No pain to palpation with full mobility Oral cavity/oropharynx: No erythema or exudate, no lesions present Nasopharynx: No mass or lesion with intact mucosa Hypopharynx: Intact mucosa without pooling of secretions Larynx Glottic: Full true vocal cord mobility without lesion or mass Supraglottic: Normal appearing epiglottis and AE folds Interarytenoid Space: Moderate pachydermia&edema Subglottic Space: Patent without lesion or edema Neck Neck and Trachea: Midline trachea without mass or lesion Thyroid : No mass or nodularity Lymphatics: No lymphadenopathy  Procedure: Preoperative diagnosis: thyroid  goiter neck tightness   Postoperative diagnosis:   Same  Procedure: Flexible fiberoptic laryngoscopy  Surgeon: Amario Longmore, MD  Anesthesia: Topical lidocaine and Afrin Complications: None Condition is stable throughout exam  Indications and consent:  The patient presents to the clinic with above symptoms. Indirect laryngoscopy view was incomplete. Thus it was recommended that they undergo a flexible fiberoptic laryngoscopy. All of the risks, benefits, and potential complications were reviewed with the patient preoperatively and verbal informed consent  was obtained.  Procedure: The patient was seated upright in the clinic. Topical lidocaine and Afrin were applied to the nasal cavity. After adequate anesthesia had occurred, I then proceeded to pass the flexible telescope into the nasal cavity. The nasal cavity was patent without rhinorrhea or polyp. The nasopharynx was also patent without mass or lesion. The base of tongue was visualized and was normal. There were no signs of pooling of secretions in the piriform sinuses. The true vocal folds were mobile bilaterally. There were no signs of glottic or supraglottic mucosal lesion or mass. There was moderate interarytenoid pachydermia and post cricoid edema. The telescope was then slowly withdrawn and the patient tolerated the procedure throughout.   Studies Reviewed: Thyroid  U/S 05/09/24 IMPRESSION: Markedly enlarged, heterogeneous and diffusely goitrous thyroid  gland. No normal thyroid  tissue is evident.   Although there are numerous areas of pseudo nodularity, no definite discrete thyroid  nodules can be identified as separate from the background goitrous change.  THS normal 3 mo ago   Assessment/Plan: Encounter Diagnoses  Name Primary?   Thyroid  goiter Yes   Neck tightness    Globus sensation [R09.A2]     Assessment and Plan Assessment & Plan Nontoxic goiter with compressive symptoms Enlarged thyroid  causing compressive symptoms. Normal thyroid  function tests. VF mobile bilaterally with flexible laryngoscopy today. Risks and benefits of surgery discussed and  she would like to proceed.  - Schedule thyroidectomy. - Advised on activity restrictions for 3-4 weeks.  Thank you for allowing me to participate in the care of this patient. Please do not hesitate to contact me with any questions or concerns.   Elena Larry, MD Otolaryngology [facility] Phone: [phone] Fax: [phone]    07/25/2024, 8:56 AM

## 2024-07-29 ENCOUNTER — Other Ambulatory Visit (HOSPITAL_COMMUNITY): Payer: Self-pay

## 2024-07-29 ENCOUNTER — Ambulatory Visit: Admitting: Internal Medicine

## 2024-07-29 ENCOUNTER — Telehealth: Payer: Self-pay | Admitting: Pharmacy Technician

## 2024-07-29 ENCOUNTER — Encounter: Payer: Self-pay | Admitting: Internal Medicine

## 2024-07-29 VITALS — BP 118/84 | HR 78 | Temp 98.2°F | Ht 67.0 in | Wt 314.0 lb

## 2024-07-29 DIAGNOSIS — M5440 Lumbago with sciatica, unspecified side: Secondary | ICD-10-CM

## 2024-07-29 DIAGNOSIS — F900 Attention-deficit hyperactivity disorder, predominantly inattentive type: Secondary | ICD-10-CM

## 2024-07-29 DIAGNOSIS — R4189 Other symptoms and signs involving cognitive functions and awareness: Secondary | ICD-10-CM | POA: Diagnosis not present

## 2024-07-29 DIAGNOSIS — I10 Essential (primary) hypertension: Secondary | ICD-10-CM | POA: Diagnosis not present

## 2024-07-29 DIAGNOSIS — D649 Anemia, unspecified: Secondary | ICD-10-CM

## 2024-07-29 DIAGNOSIS — E669 Obesity, unspecified: Secondary | ICD-10-CM | POA: Diagnosis not present

## 2024-07-29 DIAGNOSIS — R739 Hyperglycemia, unspecified: Secondary | ICD-10-CM

## 2024-07-29 DIAGNOSIS — R5382 Chronic fatigue, unspecified: Secondary | ICD-10-CM

## 2024-07-29 DIAGNOSIS — Z6841 Body Mass Index (BMI) 40.0 and over, adult: Secondary | ICD-10-CM

## 2024-07-29 DIAGNOSIS — Z7985 Long-term (current) use of injectable non-insulin antidiabetic drugs: Secondary | ICD-10-CM | POA: Diagnosis not present

## 2024-07-29 DIAGNOSIS — E1169 Type 2 diabetes mellitus with other specified complication: Secondary | ICD-10-CM

## 2024-07-29 DIAGNOSIS — G8929 Other chronic pain: Secondary | ICD-10-CM

## 2024-07-29 DIAGNOSIS — F4321 Adjustment disorder with depressed mood: Secondary | ICD-10-CM

## 2024-07-29 DIAGNOSIS — Z8616 Personal history of COVID-19: Secondary | ICD-10-CM

## 2024-07-29 LAB — COMPREHENSIVE METABOLIC PANEL WITH GFR
ALT: 40 U/L — ABNORMAL HIGH (ref 0–35)
AST: 60 U/L — ABNORMAL HIGH (ref 0–37)
Albumin: 4.3 g/dL (ref 3.5–5.2)
Alkaline Phosphatase: 61 U/L (ref 39–117)
BUN: 13 mg/dL (ref 6–23)
CO2: 28 meq/L (ref 19–32)
Calcium: 9.6 mg/dL (ref 8.4–10.5)
Chloride: 102 meq/L (ref 96–112)
Creatinine, Ser: 0.75 mg/dL (ref 0.40–1.20)
GFR: 92.46 mL/min (ref >60.00)
Glucose, Bld: 141 mg/dL — ABNORMAL HIGH (ref 70–99)
Potassium: 4.2 meq/L (ref 3.5–5.1)
Sodium: 140 meq/L (ref 135–145)
Total Bilirubin: 0.4 mg/dL (ref 0.2–1.2)
Total Protein: 7.4 g/dL (ref 6.0–8.3)

## 2024-07-29 LAB — CBC WITH DIFFERENTIAL/PLATELET
Basophils Absolute: 0.1 K/uL (ref 0.0–0.1)
Basophils Relative: 1.1 % (ref 0.0–3.0)
Eosinophils Absolute: 0.3 K/uL (ref 0.0–0.7)
Eosinophils Relative: 4.8 % (ref 0.0–5.0)
HCT: 41.1 % (ref 36.0–46.0)
Hemoglobin: 13.5 g/dL (ref 12.0–15.0)
Lymphocytes Relative: 38 % (ref 12.0–46.0)
Lymphs Abs: 2.2 K/uL (ref 0.7–4.0)
MCHC: 32.9 g/dL (ref 30.0–36.0)
MCV: 82 fl (ref 78.0–100.0)
Monocytes Absolute: 0.5 K/uL (ref 0.1–1.0)
Monocytes Relative: 8.1 % (ref 3.0–12.0)
Neutro Abs: 2.8 K/uL (ref 1.4–7.7)
Neutrophils Relative %: 48 % (ref 43.0–77.0)
Platelets: 218 K/uL (ref 150.0–400.0)
RBC: 5.01 Mil/uL (ref 3.87–5.11)
RDW: 16 % — ABNORMAL HIGH (ref 11.5–15.5)
WBC: 5.9 K/uL (ref 4.0–10.5)

## 2024-07-29 LAB — URINALYSIS
Bilirubin Urine: NEGATIVE
Hgb urine dipstick: NEGATIVE
Leukocytes,Ua: NEGATIVE
Nitrite: NEGATIVE
Specific Gravity, Urine: 1.025 (ref 1.000–1.030)
Total Protein, Urine: NEGATIVE
Urine Glucose: NEGATIVE
Urobilinogen, UA: 0.2 (ref 0.0–1.0)
pH: 5.5 (ref 5.0–8.0)

## 2024-07-29 LAB — TSH: TSH: 0.61 u[IU]/mL (ref 0.35–5.50)

## 2024-07-29 LAB — VITAMIN D 25 HYDROXY (VIT D DEFICIENCY, FRACTURES): VITD: 64.86 ng/mL (ref 30.00–100.00)

## 2024-07-29 LAB — T4, FREE: Free T4: 0.81 ng/dL (ref 0.60–1.60)

## 2024-07-29 LAB — MAGNESIUM: Magnesium: 2 mg/dL (ref 1.5–2.5)

## 2024-07-29 LAB — HEMOGLOBIN A1C: Hgb A1c MFr Bld: 8.1 % — ABNORMAL HIGH (ref 4.6–6.5)

## 2024-07-29 LAB — VITAMIN B12: Vitamin B-12: 705 pg/mL (ref 211–911)

## 2024-07-29 MED ORDER — AMPHETAMINE-DEXTROAMPHET ER 10 MG PO CP24: 10.0000 mg | ORAL_CAPSULE | Freq: Every day | ORAL | 0 refills | Status: AC

## 2024-07-29 MED ORDER — TIRZEPATIDE 2.5 MG/0.5ML ~~LOC~~ SOAJ: 2.5000 mg | SUBCUTANEOUS | 3 refills | Status: DC

## 2024-07-29 MED ORDER — CLONAZEPAM 0.5 MG PO TABS: 0.5000 mg | ORAL_TABLET | Freq: Two times a day (BID) | ORAL | 1 refills | Status: DC | PRN

## 2024-07-29 MED ORDER — AMPHETAMINE-DEXTROAMPHET ER 10 MG PO CP24: 10.0000 mg | ORAL_CAPSULE | Freq: Every day | ORAL | 0 refills | Status: DC

## 2024-07-29 NOTE — Progress Notes (Signed)
 Subjective:  Patient ID: Sylvia Alvarado, female    DOB: 1971/07/05  Age: 53 y.o. MRN: [id number]  CC: Medical Management of Chronic Issues (Medication refills, fatigue for several months. Extremely tired even with getting a full night's rest)   HPI Sylvia Alvarado presents for goiter, HAs, ADD  Outpatient Medications Prior to Visit  Medication Sig Dispense Refill   ADVAIR DISKUS 100-50 MCG/ACT AEPB INHALE 1 PUFF into THE lungs TWICE DAILY 60 each 11   albuterol  (VENTOLIN  HFA) 108 (90 Base) MCG/ACT inhaler Inhale 2 puffs into the lungs every 6 (six) hours as needed for wheezing or shortness of breath. 18 g 11   B Complex-C (SUPER B COMPLEX PO) Take by mouth.     cefdinir  (OMNICEF ) 300 MG capsule Take 1 capsule (300 mg total) by mouth 2 (two) times daily. 20 capsule 0   Cholecalciferol (VITAMIN D3) 50 MCG (2000 UT) capsule Take 1 capsule (2,000 Units total) by mouth daily. 100 capsule 3   cyclobenzaprine  (FLEXERIL ) 10 MG tablet TAKE 1 TABLET BY MOUTH TWICE DAILY 60 tablet 3   DULoxetine  (CYMBALTA ) 30 MG capsule Take 1 capsule (30 mg total) by mouth daily. 90 capsule 1   fluticasone  (FLONASE ) 50 MCG/ACT nasal spray PLACE TWO SPRAYS INTO BOTH NOSTRILS DAILY 16 g 6   furosemide  (LASIX ) 20 MG tablet Take 1 tablet (20 mg total) by mouth daily as needed. 30 tablet 3   metFORMIN  (GLUCOPHAGE ) 500 MG tablet Take 1 tablet (500 mg total) by mouth 2 (two) times daily with a meal. 180 tablet 3   metoprolol  succinate (TOPROL -XL) 25 MG 24 hr tablet TAKE 1 TABLET BY MOUTH EVERY DAY 90 tablet 2   pantoprazole  (PROTONIX ) 40 MG tablet TAKE 1 TABLET BY MOUTH DAILY 90 tablet 3   triamcinolone  cream (KENALOG ) 0.1 % APPLY TO THE AFFECTED AREA(S) FOUR TIMES DAILY 80 g 0   UNABLE TO FIND [place]-daily     amphetamine -dextroamphetamine  (ADDERALL XR) 10 MG 24 hr capsule Take 1 capsule (10 mg total) by mouth daily. 30 capsule 0   amphetamine -dextroamphetamine  (ADDERALL XR) 10 MG 24 hr capsule Take 1 capsule (10  mg total) by mouth daily. 30 capsule 0   amphetamine -dextroamphetamine  (ADDERALL XR) 10 MG 24 hr capsule Take 1 capsule (10 mg total) by mouth daily. 30 capsule 0   clonazePAM  (KLONOPIN ) 1 MG tablet TAKE 1 TABLET BY MOUTH TWICE DAILY AS NEEDED 60 tablet 1   meloxicam  (MOBIC ) 15 MG tablet TAKE 1 TABLET BY MOUTH EVERY DAY (Patient not taking: Reported on 07/29/2024) 30 tablet 2   No facility-administered medications prior to visit.    ROS: Review of Systems  Constitutional:  Positive for fatigue. Negative for activity change, appetite change, chills and unexpected weight change.  HENT:  Negative for congestion, mouth sores and sinus pressure.   Eyes:  Negative for visual disturbance.  Respiratory:  Negative for cough and chest tightness.   Gastrointestinal:  Negative for abdominal pain and nausea.  Genitourinary:  Negative for difficulty urinating, frequency and vaginal pain.  Musculoskeletal:  Positive for back pain. Negative for gait problem.  Skin:  Negative for pallor and rash.  Neurological:  Negative for dizziness, tremors, weakness, numbness and headaches.  Psychiatric/Behavioral:  Positive for decreased concentration. Negative for confusion and sleep disturbance. The patient is not nervous/anxious.     Objective:  BP 118/84   Pulse 78   Temp 98.2 F (36.8 C)   Ht 5' 7 (1.702 m)   [place] ROLLEN)  314 lb (142.4 kg)   SpO2 99%   BMI 49.18 kg/m   BP Readings from Last 3 Encounters:  07/29/24 118/84  07/25/24 (!) 140/86  06/25/24 132/87    Wt Readings from Last 3 Encounters:  07/29/24 (!) 314 lb (142.4 kg)  06/25/24 (!) 312 lb (141.5 kg)  05/26/24 (!) 317 lb (143.8 kg)    Physical Exam Constitutional:      General: She is not in acute distress.    Appearance: She is well-developed. She is obese.  HENT:     Head: Normocephalic.     Right Ear: External ear normal.     Left Ear: External ear normal.     Nose: Nose normal.  Eyes:     General:        Right eye: No  discharge.        Left eye: No discharge.     Conjunctiva/sclera: Conjunctivae normal.     Pupils: Pupils are equal, round, and reactive to light.  Neck:     Thyroid : No thyromegaly.     Vascular: No JVD.     Trachea: No tracheal deviation.  Cardiovascular:     Rate and Rhythm: Normal rate and regular rhythm.     Heart sounds: Normal heart sounds.  Pulmonary:     Effort: No respiratory distress.     Breath sounds: No stridor. No wheezing.  Abdominal:     General: Bowel sounds are normal. There is no distension.     Palpations: Abdomen is soft. There is no mass.     Tenderness: There is no abdominal tenderness. There is no guarding or rebound.  Musculoskeletal:        General: No tenderness.     Cervical back: Normal range of motion and neck supple. No rigidity.  Lymphadenopathy:     Cervical: No cervical adenopathy.  Skin:    Findings: No erythema or rash.  Neurological:     Cranial Nerves: No cranial nerve deficit.     Motor: No abnormal muscle tone.     Coordination: Coordination normal.     Deep Tendon Reflexes: Reflexes normal.  Psychiatric:        Behavior: Behavior normal.        Thought Content: Thought content normal.        Judgment: Judgment normal.     Lab Results  Component Value Date   WBC 6.5 04/15/2024   HGB 11.4 (L) 04/15/2024   HCT 35.7 (L) 04/15/2024   PLT 254.0 04/15/2024   GLUCOSE 137 (H) 04/15/2024   CHOL 193 03/27/2023   TRIG 259.0 (H) 03/27/2023   HDL 52.90 03/27/2023   LDLDIRECT 114.0 03/27/2023   LDLCALC 94 03/17/2022   ALT 34 04/15/2024   AST 42 (H) 04/15/2024   NA 137 04/15/2024   K 4.1 04/15/2024   CL 101 04/15/2024   CREATININE 0.80 04/15/2024   BUN 10 04/15/2024   CO2 29 04/15/2024   TSH 0.52 04/15/2024   HGBA1C 7.3 (H) 04/15/2024    No results found.  Assessment & Plan:   Problem List Items Addressed This Visit     ADD (attention deficit disorder)   Relevant Orders   Vitamin B12   VITAMIN D  25 Hydroxy (Vit-D  Deficiency, Fractures)   Urinalysis   Magnesium   Brain fog   Long COVID most likely      Relevant Orders   Vitamin B12   VITAMIN D  25 Hydroxy (Vit-D Deficiency, Fractures)   Urinalysis  Magnesium   Chronic fatigue   Fatigue  ?post COVID      Essential hypertension   Cont Toprol       Relevant Orders   VITAMIN D  25 Hydroxy (Vit-D Deficiency, Fractures)   Grief   Thyroidectomy is pending      History of COVID-19   ?Long COVID: fatigue, brain fog      Hyperglycemia   Relevant Orders   VITAMIN D  25 Hydroxy (Vit-D Deficiency, Fractures)   LOW BACK PAIN   Relevant Orders   Comprehensive metabolic panel with GFR   TSH   Vitamin B12   VITAMIN D  25 Hydroxy (Vit-D Deficiency, Fractures)   Urinalysis   Situational depression   Possibly bipolar depression Cymbalta  Clonazepam  prn  Potential benefits of a long term benzodiazepines  use as well as potential risks  and complications were explained to the patient and were aknowledged.      Type 2 diabetes mellitus with obesity (HCC) - Primary   Re-start Mounjaro       Relevant Medications   tirzepatide  (MOUNJARO ) 2.5 MG/0.5ML Pen   Other Relevant Orders   Comprehensive metabolic panel with GFR   Hemoglobin A1c   T4, free   Hemoglobin A1c   VITAMIN D  25 Hydroxy (Vit-D Deficiency, Fractures)   Other Visit Diagnoses       Anemia, unspecified type             Meds ordered this encounter  Medications   amphetamine -dextroamphetamine  (ADDERALL XR) 10 MG 24 hr capsule    Sig: Take 1 capsule (10 mg total) by mouth daily.    Dispense:  30 capsule    Refill:  0    Please fill on or after 07/29/24   amphetamine -dextroamphetamine  (ADDERALL XR) 10 MG 24 hr capsule    Sig: Take 1 capsule (10 mg total) by mouth daily.    Dispense:  30 capsule    Refill:  0    Please fill on or after 08/28/24   amphetamine -dextroamphetamine  (ADDERALL XR) 10 MG 24 hr capsule    Sig: Take 1 capsule (10 mg total) by mouth daily.     Dispense:  30 capsule    Refill:  0    Please fill on or after 09/27/24   clonazePAM  (KLONOPIN ) 0.5 MG tablet    Sig: Take 1 tablet (0.5 mg total) by mouth 2 (two) times daily as needed for anxiety.    Dispense:  60 tablet    Refill:  1   tirzepatide  (MOUNJARO ) 2.5 MG/0.5ML Pen    Sig: Inject 2.5 mg into the skin once a week.    Dispense:  2 mL    Refill:  3      Follow-up: Return in about 3 months (around 10/28/2024) for a follow-up visit.  Marolyn Noel, MD

## 2024-07-29 NOTE — Assessment & Plan Note (Signed)
Possibly bipolar depression Cymbalta Clonazepam prn  Potential benefits of a long term benzodiazepines  use as well as potential risks  and complications were explained to the patient and were aknowledged. 

## 2024-07-29 NOTE — Assessment & Plan Note (Addendum)
?  Long COVID: fatigue, brain fog

## 2024-07-29 NOTE — Assessment & Plan Note (Signed)
Cont Toprol °

## 2024-07-29 NOTE — Assessment & Plan Note (Signed)
 Long COVID most likely

## 2024-07-29 NOTE — Telephone Encounter (Signed)
 Pharmacy Patient Advocate Encounter  Received notification from [facility] MEDICAID that Prior Authorization for Mounjaro  2.5MG /0.5ML auto-injectors  has been APPROVED from 07/29/24 to 07/29/25. Ran test claim, Copay is $25.00. This test claim was processed through [facility]- copay amounts may vary at other pharmacies due to pharmacy/plan contracts, or as the patient moves through the different stages of their insurance plan.   PA #/Case ID/Reference #: [id number]

## 2024-07-29 NOTE — Telephone Encounter (Signed)
 Pharmacy Patient Advocate Encounter   Received notification from CoverMyMeds that prior authorization for Mounjaro  2.5MG /0.5ML auto-injectors  is required/requested.   Insurance verification completed.   The patient is insured through [facility] MEDICAID .   Per test claim: PA required; PA submitted to above mentioned insurance via Latent Key/confirmation #/EOC [id number] Status is pending

## 2024-07-29 NOTE — Assessment & Plan Note (Signed)
 Fatigue  ?post COVID

## 2024-07-29 NOTE — Assessment & Plan Note (Signed)
 Thyroidectomy is pending

## 2024-07-29 NOTE — Assessment & Plan Note (Signed)
Re-start Mounjaro

## 2024-07-30 ENCOUNTER — Other Ambulatory Visit (HOSPITAL_COMMUNITY): Payer: Self-pay

## 2024-07-30 LAB — IRON,TIBC AND FERRITIN PANEL
%SAT: 12 % — ABNORMAL LOW (ref 16–45)
Ferritin: 11 ng/mL — ABNORMAL LOW (ref 16–232)
Iron: 55 ug/dL (ref 45–160)
TIBC: 447 ug/dL (ref 250–450)

## 2024-08-01 ENCOUNTER — Telehealth: Admitting: Physician Assistant

## 2024-08-01 DIAGNOSIS — J329 Chronic sinusitis, unspecified: Secondary | ICD-10-CM

## 2024-08-01 DIAGNOSIS — B9789 Other viral agents as the cause of diseases classified elsewhere: Secondary | ICD-10-CM | POA: Diagnosis not present

## 2024-08-01 MED ORDER — AZELASTINE HCL 0.1 % NA SOLN: 2.0000 | Freq: Two times a day (BID) | NASAL | 12 refills | Status: AC

## 2024-08-01 MED ORDER — LEVOCETIRIZINE DIHYDROCHLORIDE 5 MG PO TABS: 5.0000 mg | ORAL_TABLET | Freq: Every evening | ORAL | 0 refills | Status: AC

## 2024-08-01 NOTE — Progress Notes (Signed)
 I have spent 5 minutes in review of e-visit questionnaire, review and updating patient chart, medical decision making and response to patient.   Laure Kidney, PA-C

## 2024-08-01 NOTE — Progress Notes (Signed)
E-Visit for Sinus Problems  We are sorry that you are not feeling well.  Here is how we plan to help!  Based on what you have shared with me it looks like you have sinusitis.  Sinusitis is inflammation and infection in the sinus cavities of the head.  Based on your presentation I believe you most likely have Acute Viral Sinusitis.This is an infection most likely caused by a virus. There is not specific treatment for viral sinusitis other than to help you with the symptoms until the infection runs its course.  You may use an oral decongestant such as Mucinex D or if you have glaucoma or high blood pressure use plain Mucinex. Saline nasal spray help and can safely be used as often as needed for congestion, I have prescribed: Azelastine nasal spray 2 sprays in each nostril twice a day  Some authorities believe that zinc sprays or the use of Echinacea may shorten the course of your symptoms.  Sinus infections are not as easily transmitted as other respiratory infection, however we still recommend that you avoid close contact with loved ones, especially the very young and elderly.  Remember to wash your hands thoroughly throughout the day as this is the number one way to prevent the spread of infection!  Home Care: Only take medications as instructed by your medical team. Do not take these medications with alcohol. A steam or ultrasonic humidifier can help congestion.  You can place a towel over your head and breathe in the steam from hot water coming from a faucet. Avoid close contacts especially the very young and the elderly. Cover your mouth when you cough or sneeze. Always remember to wash your hands.  Get Help Right Away If: You develop worsening fever or sinus pain. You develop a severe head ache or visual changes. Your symptoms persist after you have completed your treatment plan.  Make sure you Understand these instructions. Will watch your condition. Will get help right away if you are  not doing well or get worse.   Thank you for choosing an e-visit.  Your e-visit answers were reviewed by a board certified advanced clinical practitioner to complete your personal care plan. Depending upon the condition, your plan could have included both over the counter or prescription medications.  Please review your pharmacy choice. Make sure the pharmacy is open so you can pick up prescription now. If there is a problem, you may contact your provider through MyChart messaging and have the prescription routed to another pharmacy.  Your safety is important to us. If you have drug allergies check your prescription carefully.   For the next 24 hours you can use MyChart to ask questions about today's visit, request a non-urgent call back, or ask for a work or school excuse. You will get an email in the next two days asking about your experience. I hope that your e-visit has been valuable and will speed your recovery.   

## 2024-08-04 ENCOUNTER — Ambulatory Visit: Payer: Self-pay | Admitting: Internal Medicine

## 2024-08-18 ENCOUNTER — Encounter (INDEPENDENT_AMBULATORY_CARE_PROVIDER_SITE_OTHER): Payer: Self-pay | Admitting: Otolaryngology

## 2024-08-19 NOTE — Pre-Procedure Instructions (Signed)
 Surgical Instructions   Your procedure is scheduled on August 26, 2024. Report to [facility] Main Entrance A at 6:50 A.M., then check in with the Admitting office. Any questions or running late day of surgery: call [phone]  Questions prior to your surgery date: call [phone], Monday-Friday, 8am-4pm. If you experience any cold or flu symptoms such as cough, fever, chills, shortness of breath, etc. between now and your scheduled surgery, please notify us  at the above number.     Remember:  Do not eat after midnight the night before your surgery   You may drink clear liquids until 5:50 AM the morning of your surgery.   Clear liquids allowed are: Water, Non-Citrus Juices (without pulp), Carbonated Beverages, Clear Tea (no milk, honey, etc.), Black Coffee Only (NO MILK, CREAM OR POWDERED CREAMER of any kind), and Gatorade.    Take these medicines the morning of surgery with A SIP OF WATER: DULoxetine  (CYMBALTA )  metoprolol  succinate (TOPROL -XL)  pantoprazole  (PROTONIX )    May take these medicines IF NEEDED: albuterol  (VENTOLIN  HFA) inhaler - please bring inhaler with you morning of surgery azelastine  (ASTELIN ) nasal spray  clonazePAM  (KLONOPIN )  cyclobenzaprine  (FLEXERIL )  fluticasone  (FLONASE ) nasal spray    One week prior to surgery, STOP taking any Aspirin (unless otherwise instructed by your surgeon) Aleve , Naproxen , Ibuprofen, Motrin, Advil, Goody's, BC's, all herbal medications, fish oil, and non-prescription vitamins. This includes your medication: meloxicam  (MOBIC )    WHAT DO I DO ABOUT MY DIABETES MEDICATION?   Do not take metFORMIN  (GLUCOPHAGE ) the morning of surgery.  STOP taking your tirzepatide  (MOUNJARO ) one week prior to surgery. DO NOT take any doses after October 6th.       HOW TO MANAGE YOUR DIABETES BEFORE AND AFTER SURGERY  Why is it important to control my blood sugar before and after surgery? Improving blood sugar levels before and after  surgery helps healing and can limit problems. A way of improving blood sugar control is eating a healthy diet by:  Eating less sugar and carbohydrates  Increasing activity/exercise  Talking with your doctor about reaching your blood sugar goals High blood sugars (greater than 180 mg/dL) can raise your risk of infections and slow your recovery, so you will need to focus on controlling your diabetes during the weeks before surgery. Make sure that the doctor who takes care of your diabetes knows about your planned surgery including the date and location.  How do I manage my blood sugar before surgery? Check your blood sugar at least 4 times a day, starting 2 days before surgery, to make sure that the level is not too high or low.  Check your blood sugar the morning of your surgery when you wake up and every 2 hours until you get to the Short Stay unit.  If your blood sugar is less than 70 mg/dL, you will need to treat for low blood sugar: Do not take insulin. Treat a low blood sugar (less than 70 mg/dL) with  cup of clear juice (cranberry or apple), 4 glucose tablets, OR glucose gel. Recheck blood sugar in 15 minutes after treatment (to make sure it is greater than 70 mg/dL). If your blood sugar is not greater than 70 mg/dL on recheck, call [phone] for further instructions. Report your blood sugar to the short stay nurse when you get to Short Stay.  If you are admitted to the hospital after surgery: Your blood sugar will be checked by the staff and you will probably be given insulin  after surgery (instead of oral diabetes medicines) to make sure you have good blood sugar levels. The goal for blood sugar control after surgery is 80-180 mg/dL.                      Do NOT Smoke (Tobacco/Vaping) for 24 hours prior to your procedure.  If you use a CPAP at night, you may bring your mask/headgear for your overnight stay.   You will be asked to remove any contacts, glasses, piercing's,  hearing aid's, dentures/partials prior to surgery. Please bring cases for these items if needed.    Patients discharged the day of surgery will not be allowed to drive home, and someone needs to stay with them for 24 hours.  SURGICAL WAITING ROOM VISITATION Patients may have no more than 2 support people in the waiting area - these visitors may rotate.   Pre-op nurse will coordinate an appropriate time for 1 ADULT support person, who may not rotate, to accompany patient in pre-op.  Children under the age of 90 must have an adult with them who is not the patient and must remain in the main waiting area with an adult.  If the patient needs to stay at the hospital during part of their recovery, the visitor guidelines for inpatient rooms apply.  Please refer to the [facility] website for the visitor guidelines for any additional information.   If you received a COVID test during your pre-op visit  it is requested that you wear a mask when out in public, stay away from anyone that may not be feeling well and notify your surgeon if you develop symptoms. If you have been in contact with anyone that has tested positive in the last 10 days please notify you surgeon.      Pre-operative CHG Bathing Instructions   You can play a key role in reducing the risk of infection after surgery. Your skin needs to be as free of germs as possible. You can reduce the number of germs on your skin by washing with CHG (chlorhexidine gluconate) soap before surgery. CHG is an antiseptic soap that kills germs and continues to kill germs even after washing.   DO NOT use if you have an allergy to chlorhexidine/CHG or antibacterial soaps. If your skin becomes reddened or irritated, stop using the CHG and notify one of our RNs at [phone].              TAKE A SHOWER THE NIGHT BEFORE SURGERY   Please keep in mind the following:  DO NOT shave, including legs and underarms, 48 hours prior to surgery.   You may shave  your face before/day of surgery.  Place clean sheets on your bed the night before surgery Use a clean washcloth (not used since being washed) for shower. DO NOT sleep with pet's night before surgery.  CHG Shower Instructions:  Wash your face and private area with normal soap. If you choose to wash your hair, wash first with your normal shampoo.  After you use shampoo/soap, rinse your hair and body thoroughly to remove shampoo/soap residue.  Turn the water OFF and apply half the bottle of CHG soap to a CLEAN washcloth.  Apply CHG soap ONLY FROM YOUR NECK DOWN TO YOUR TOES (washing for 3-5 minutes)  DO NOT use CHG soap on face, private areas, open wounds, or sores.  Pay special attention to the area where your surgery is being performed.  If you are having  back surgery, having someone wash your back for you may be helpful. Wait 2 minutes after CHG soap is applied, then you may rinse off the CHG soap.  Pat dry with a clean towel  Put on clean pajamas    Additional instructions for the day of surgery: If you choose, you may shower the morning of surgery with an antibacterial soap.  DO NOT APPLY any lotions, deodorants, cologne, or perfumes.   Do not wear jewelry or makeup Do not wear nail polish, gel polish, artificial nails, or any other type of covering on natural nails (fingers and toes) Do not bring valuables to the hospital. [facility] is not responsible for valuables/personal belongings. Put on clean/comfortable clothes.  Please brush your teeth.  Ask your nurse before applying any prescription medications to the skin.

## 2024-08-20 ENCOUNTER — Encounter (HOSPITAL_COMMUNITY)
Admission: RE | Admit: 2024-08-20 | Discharge: 2024-08-20 | Disposition: A | Discharge status: Home / Self Care | Source: Ambulatory Visit | Attending: Otolaryngology | Admitting: Otolaryngology

## 2024-08-20 ENCOUNTER — Other Ambulatory Visit: Payer: Self-pay

## 2024-08-20 ENCOUNTER — Encounter (HOSPITAL_COMMUNITY): Payer: Self-pay

## 2024-08-20 DIAGNOSIS — Z01818 Encounter for other preprocedural examination: Secondary | ICD-10-CM | POA: Diagnosis present

## 2024-08-20 HISTORY — DX: Unspecified osteoarthritis, unspecified site: M19.90

## 2024-08-20 HISTORY — DX: Fatty (change of) liver, not elsewhere classified: K76.0

## 2024-08-20 HISTORY — DX: Gastro-esophageal reflux disease without esophagitis: K21.9

## 2024-08-20 LAB — GLUCOSE, CAPILLARY: Glucose-Capillary: 146 mg/dL — ABNORMAL HIGH (ref 70–99)

## 2024-08-20 NOTE — Progress Notes (Signed)
 PCP - Plotnikov, Aleksei, MD Cardiologist - denies  PPM/ICD - denies Device Orders - n/a Rep Notified - n/a  Chest x-ray - n/a EKG - 08/20/2024 Stress Test - >20 years ago per pt ECHO - denies Cardiac Cath - denies  Sleep Study - yes; diagnosed with OSA CPAP - yes  Fasting Blood Sugar - 125-145 per pt; CBG 146 at PAT Checks Blood Sugar once/month  Last dose of GLP1 agonist-  08/18/2024 GLP1 instructions: hold for 7 days   Blood Thinner Instructions: n/a Aspirin Instructions: n/a  ERAS Protcol -yes PRE-SURGERY Ensure or G2- no  COVID TEST- n/a   Anesthesia review: yes, hx of OSA, DM2, HTN  Patient denies shortness of breath, fever, cough and chest pain at PAT appointment   All instructions explained to the patient, with a verbal understanding of the material. Patient agrees to go over the instructions while at home for a better understanding. Patient also instructed to self quarantine after being tested for COVID-19. The opportunity to ask questions was provided.

## 2024-08-26 ENCOUNTER — Encounter (HOSPITAL_COMMUNITY)
Admission: RE | Disposition: A | Discharge status: Home / Self Care | Payer: Self-pay | Source: Home / Self Care | Attending: Otolaryngology

## 2024-08-26 ENCOUNTER — Other Ambulatory Visit: Payer: Self-pay

## 2024-08-26 ENCOUNTER — Ambulatory Visit (HOSPITAL_COMMUNITY): Payer: Self-pay | Admitting: Anesthesiology

## 2024-08-26 ENCOUNTER — Ambulatory Visit (HOSPITAL_COMMUNITY): Payer: Self-pay | Admitting: Physician Assistant

## 2024-08-26 ENCOUNTER — Observation Stay (HOSPITAL_COMMUNITY)
Admission: RE | Admit: 2024-08-26 | Discharge: 2024-08-27 | Disposition: A | Discharge status: Home / Self Care | Attending: Otolaryngology | Admitting: Otolaryngology

## 2024-08-26 DIAGNOSIS — Z79899 Other long term (current) drug therapy: Secondary | ICD-10-CM | POA: Diagnosis not present

## 2024-08-26 DIAGNOSIS — Z87891 Personal history of nicotine dependence: Secondary | ICD-10-CM | POA: Diagnosis not present

## 2024-08-26 DIAGNOSIS — I10 Essential (primary) hypertension: Secondary | ICD-10-CM

## 2024-08-26 DIAGNOSIS — M542 Cervicalgia: Secondary | ICD-10-CM | POA: Diagnosis not present

## 2024-08-26 DIAGNOSIS — E119 Type 2 diabetes mellitus without complications: Secondary | ICD-10-CM | POA: Insufficient documentation

## 2024-08-26 DIAGNOSIS — Z7984 Long term (current) use of oral hypoglycemic drugs: Secondary | ICD-10-CM | POA: Insufficient documentation

## 2024-08-26 DIAGNOSIS — J45909 Unspecified asthma, uncomplicated: Secondary | ICD-10-CM | POA: Insufficient documentation

## 2024-08-26 DIAGNOSIS — R29898 Other symptoms and signs involving the musculoskeletal system: Secondary | ICD-10-CM | POA: Diagnosis not present

## 2024-08-26 DIAGNOSIS — E049 Nontoxic goiter, unspecified: Principal | ICD-10-CM

## 2024-08-26 DIAGNOSIS — E042 Nontoxic multinodular goiter: Secondary | ICD-10-CM

## 2024-08-26 DIAGNOSIS — R1310 Dysphagia, unspecified: Secondary | ICD-10-CM | POA: Insufficient documentation

## 2024-08-26 LAB — GLUCOSE, CAPILLARY
Glucose-Capillary: 141 mg/dL — ABNORMAL HIGH (ref 70–99)
Glucose-Capillary: 149 mg/dL — ABNORMAL HIGH (ref 70–99)
Glucose-Capillary: 137 mg/dL — ABNORMAL HIGH (ref 70–99)
Glucose-Capillary: 124 mg/dL — ABNORMAL HIGH (ref 70–99)
Glucose-Capillary: 176 mg/dL — ABNORMAL HIGH (ref 70–99)
Glucose-Capillary: 141 mg/dL — ABNORMAL HIGH (ref 70–99)

## 2024-08-26 LAB — CREATININE, SERUM
Creatinine, Ser: 0.85 mg/dL (ref 0.44–1.00)
GFR, Estimated: >60 mL/min (ref >60)

## 2024-08-26 LAB — CBC
HCT: 40.6 % (ref 36.0–46.0)
Hemoglobin: 13.2 g/dL (ref 12.0–15.0)
MCH: 27.8 pg (ref 26.0–34.0)
MCHC: 32.5 g/dL (ref 30.0–36.0)
MCV: 85.5 fL (ref 80.0–100.0)
Platelets: 244 K/uL (ref 150–400)
RBC: 4.75 MIL/uL (ref 3.87–5.11)
RDW: 14.5 % (ref 11.5–15.5)
WBC: 6.8 K/uL (ref 4.0–10.5)
nRBC: 0 % (ref 0.0–0.2)

## 2024-08-26 LAB — CALCIUM: Calcium: 8.8 mg/dL — ABNORMAL LOW (ref 8.9–10.3)

## 2024-08-26 SURGERY — THYROIDECTOMY
Anesthesia: General | Site: Neck

## 2024-08-26 MED ORDER — DEXAMETHASONE SOD PHOSPHATE PF 10 MG/ML IJ SOLN
INTRAMUSCULAR | Status: DC | PRN
  Administered 2024-08-26: 10 mg via INTRAVENOUS

## 2024-08-26 MED ORDER — LIDOCAINE-EPINEPHRINE 1 %-1:100000 IJ SOLN
INTRAMUSCULAR | Status: AC
  Filled 2024-08-26: qty 1

## 2024-08-26 MED ORDER — CHLORHEXIDINE GLUCONATE 0.12 % MT SOLN
OROMUCOSAL | Status: AC
  Administered 2024-08-26: 15 mL via OROMUCOSAL
  Filled 2024-08-26: qty 15

## 2024-08-26 MED ORDER — CHLORHEXIDINE GLUCONATE 0.12 % MT SOLN: 15.0000 mL | Freq: Once | OROMUCOSAL | Status: AC

## 2024-08-26 MED ORDER — METOPROLOL SUCCINATE ER 25 MG PO TB24
25.0000 mg | ORAL_TABLET | Freq: Every day | ORAL | Status: DC
  Administered 2024-08-27: 25 mg via ORAL
  Filled 2024-08-26: qty 1

## 2024-08-26 MED ORDER — FENTANYL CITRATE (PF) 250 MCG/5ML IJ SOLN
INTRAMUSCULAR | Status: DC | PRN
  Administered 2024-08-26: 50 ug via INTRAVENOUS
  Administered 2024-08-26 (×2): 25 ug via INTRAVENOUS
  Administered 2024-08-26: 50 ug via INTRAVENOUS

## 2024-08-26 MED ORDER — AMISULPRIDE (ANTIEMETIC) 5 MG/2ML IV SOLN
10.0000 mg | Freq: Once | INTRAVENOUS | Status: DC | PRN
Start: 2024-08-26 — End: 2024-08-26

## 2024-08-26 MED ORDER — LEVOTHYROXINE SODIUM 100 MCG PO TABS
200.0000 ug | ORAL_TABLET | Freq: Every day | ORAL | Status: DC
  Administered 2024-08-27: 200 ug via ORAL
  Filled 2024-08-26: qty 2

## 2024-08-26 MED ORDER — PHENYLEPHRINE HCL-NACL 20-0.9 MG/250ML-% IV SOLN
INTRAVENOUS | Status: DC | PRN
  Administered 2024-08-26: 30 ug/min via INTRAVENOUS

## 2024-08-26 MED ORDER — SODIUM CHLORIDE 0.9 % IV SOLN: 12.5000 mg | INTRAVENOUS | Status: DC | PRN

## 2024-08-26 MED ORDER — MIDAZOLAM HCL 2 MG/2ML IJ SOLN
INTRAMUSCULAR | Status: AC
  Filled 2024-08-26: qty 2

## 2024-08-26 MED ORDER — INSULIN ASPART 100 UNIT/ML IJ SOLN
INTRAMUSCULAR | Status: AC
  Filled 2024-08-26: qty 1

## 2024-08-26 MED ORDER — OXYCODONE HCL 5 MG PO TABS
5.0000 mg | ORAL_TABLET | Freq: Four times a day (QID) | ORAL | Status: DC | PRN
  Administered 2024-08-26: 5 mg via ORAL
  Filled 2024-08-26: qty 1

## 2024-08-26 MED ORDER — MIDAZOLAM HCL 2 MG/2ML IJ SOLN
INTRAMUSCULAR | Status: DC | PRN
  Administered 2024-08-26: 2 mg via INTRAVENOUS

## 2024-08-26 MED ORDER — ONDANSETRON HCL 4 MG/2ML IJ SOLN: 4.0000 mg | INTRAMUSCULAR | Status: DC | PRN

## 2024-08-26 MED ORDER — INSULIN ASPART 100 UNIT/ML IJ SOLN
0.0000 [IU] | INTRAMUSCULAR | Status: DC | PRN
  Administered 2024-08-26: 2 [IU] via SUBCUTANEOUS

## 2024-08-26 MED ORDER — PROPOFOL 500 MG/50ML IV EMUL
INTRAVENOUS | Status: DC | PRN
Start: 2024-08-26 — End: 2024-08-26
  Administered 2024-08-26: 150 ug/kg/min via INTRAVENOUS

## 2024-08-26 MED ORDER — SUCCINYLCHOLINE CHLORIDE 200 MG/10ML IV SOSY
PREFILLED_SYRINGE | INTRAVENOUS | Status: DC | PRN
  Administered 2024-08-26: 200 mg via INTRAVENOUS

## 2024-08-26 MED ORDER — MORPHINE SULFATE (PF) 2 MG/ML IV SOLN: 2.0000 mg | INTRAVENOUS | Status: DC | PRN

## 2024-08-26 MED ORDER — DEXMEDETOMIDINE HCL IN NACL 80 MCG/20ML IV SOLN
INTRAVENOUS | Status: DC | PRN
  Administered 2024-08-26: 4 ug via INTRAVENOUS

## 2024-08-26 MED ORDER — INSULIN ASPART 100 UNIT/ML IJ SOLN
0.0000 [IU] | Freq: Three times a day (TID) | INTRAMUSCULAR | Status: DC
  Administered 2024-08-26: 4 [IU] via SUBCUTANEOUS
  Administered 2024-08-27: 3 [IU] via SUBCUTANEOUS

## 2024-08-26 MED ORDER — HYDROMORPHONE HCL 1 MG/ML IJ SOLN
0.2500 mg | INTRAMUSCULAR | Status: DC | PRN
  Administered 2024-08-26: 0.5 mg via INTRAVENOUS
  Administered 2024-08-26 (×2): 0.25 mg via INTRAVENOUS

## 2024-08-26 MED ORDER — ORAL CARE MOUTH RINSE: 15.0000 mL | Freq: Once | OROMUCOSAL | Status: AC

## 2024-08-26 MED ORDER — BACITRACIN ZINC 500 UNIT/GM EX OINT
1.0000 | TOPICAL_OINTMENT | Freq: Three times a day (TID) | CUTANEOUS | Status: DC
  Administered 2024-08-26 – 2024-08-27 (×3): 1 via TOPICAL
  Filled 2024-08-26: qty 28.35

## 2024-08-26 MED ORDER — METFORMIN HCL 500 MG PO TABS
500.0000 mg | ORAL_TABLET | Freq: Two times a day (BID) | ORAL | Status: DC
  Administered 2024-08-26 – 2024-08-27 (×2): 500 mg via ORAL
  Filled 2024-08-26 (×2): qty 1

## 2024-08-26 MED ORDER — ENOXAPARIN SODIUM 40 MG/0.4ML IJ SOSY
40.0000 mg | PREFILLED_SYRINGE | INTRAMUSCULAR | Status: DC
  Administered 2024-08-27: 40 mg via SUBCUTANEOUS
  Filled 2024-08-26: qty 0.4

## 2024-08-26 MED ORDER — ONDANSETRON HCL 4 MG/2ML IJ SOLN
INTRAMUSCULAR | Status: DC | PRN
  Administered 2024-08-26: 4 mg via INTRAVENOUS

## 2024-08-26 MED ORDER — OXYCODONE HCL 5 MG PO TABS: 5.0000 mg | ORAL_TABLET | Freq: Once | ORAL | Status: DC | PRN

## 2024-08-26 MED ORDER — LIDOCAINE 2% (20 MG/ML) 5 ML SYRINGE
INTRAMUSCULAR | Status: DC | PRN
  Administered 2024-08-26: 100 mg via INTRAVENOUS

## 2024-08-26 MED ORDER — CYCLOBENZAPRINE HCL 10 MG PO TABS: 10.0000 mg | ORAL_TABLET | Freq: Two times a day (BID) | ORAL | Status: DC | PRN

## 2024-08-26 MED ORDER — ACETAMINOPHEN 325 MG PO TABS
650.0000 mg | ORAL_TABLET | Freq: Four times a day (QID) | ORAL | Status: DC
  Administered 2024-08-26 – 2024-08-27 (×4): 650 mg via ORAL
  Filled 2024-08-26 (×4): qty 2

## 2024-08-26 MED ORDER — CEFAZOLIN SODIUM-DEXTROSE 3-4 GM/150ML-% IV SOLN
INTRAVENOUS | Status: AC
  Filled 2024-08-26: qty 150

## 2024-08-26 MED ORDER — SODIUM CHLORIDE 0.9 % IV SOLN
0.1500 ug/kg/min | INTRAVENOUS | Status: AC
  Administered 2024-08-26: .05 ug/kg/min via INTRAVENOUS
  Filled 2024-08-26: qty 2000

## 2024-08-26 MED ORDER — PROPOFOL 10 MG/ML IV BOLUS
INTRAVENOUS | Status: DC | PRN
  Administered 2024-08-26: 200 mg via INTRAVENOUS

## 2024-08-26 MED ORDER — FENTANYL CITRATE (PF) 250 MCG/5ML IJ SOLN
INTRAMUSCULAR | Status: AC
  Filled 2024-08-26: qty 5

## 2024-08-26 MED ORDER — CEFAZOLIN SODIUM-DEXTROSE 3-4 GM/150ML-% IV SOLN
3.0000 g | Freq: Once | INTRAVENOUS | Status: AC
  Administered 2024-08-26: 3 g via INTRAVENOUS

## 2024-08-26 MED ORDER — LIDOCAINE-EPINEPHRINE 1 %-1:100000 IJ SOLN
INTRAMUSCULAR | Status: DC | PRN
  Administered 2024-08-26: 7 mL

## 2024-08-26 MED ORDER — AMPHETAMINE-DEXTROAMPHET ER 10 MG PO CP24
10.0000 mg | ORAL_CAPSULE | Freq: Every day | ORAL | Status: DC
  Administered 2024-08-27: 10 mg via ORAL
  Filled 2024-08-26: qty 1

## 2024-08-26 MED ORDER — MEPERIDINE HCL 25 MG/ML IJ SOLN: 6.2500 mg | INTRAMUSCULAR | Status: DC | PRN

## 2024-08-26 MED ORDER — ALBUTEROL SULFATE HFA 108 (90 BASE) MCG/ACT IN AERS
INHALATION_SPRAY | RESPIRATORY_TRACT | Status: DC | PRN
  Administered 2024-08-26: 3 via RESPIRATORY_TRACT

## 2024-08-26 MED ORDER — INSULIN ASPART 100 UNIT/ML IJ SOLN: 0.0000 [IU] | Freq: Every day | INTRAMUSCULAR | Status: DC

## 2024-08-26 MED ORDER — IBUPROFEN 600 MG PO TABS
600.0000 mg | ORAL_TABLET | Freq: Four times a day (QID) | ORAL | Status: DC
  Administered 2024-08-26 – 2024-08-27 (×4): 600 mg via ORAL
  Filled 2024-08-26 (×4): qty 1

## 2024-08-26 MED ORDER — HYDROMORPHONE HCL 1 MG/ML IJ SOLN
INTRAMUSCULAR | Status: AC
  Filled 2024-08-26: qty 0.5

## 2024-08-26 MED ORDER — CLONAZEPAM 0.5 MG PO TABS: 1.0000 mg | ORAL_TABLET | Freq: Two times a day (BID) | ORAL | Status: DC | PRN

## 2024-08-26 MED ORDER — 0.9 % SODIUM CHLORIDE (POUR BTL) OPTIME
TOPICAL | Status: DC | PRN
  Administered 2024-08-26: 1000 mL

## 2024-08-26 MED ORDER — VITAMIN D3 25 MCG (1000 UNIT) PO TABS
2000.0000 [IU] | ORAL_TABLET | Freq: Every day | ORAL | Status: DC
Start: 2024-08-26 — End: 2024-08-27
  Administered 2024-08-26 – 2024-08-27 (×2): 2000 [IU] via ORAL
  Filled 2024-08-26 (×4): qty 2

## 2024-08-26 MED ORDER — ALBUTEROL SULFATE HFA 108 (90 BASE) MCG/ACT IN AERS: 2.0000 | INHALATION_SPRAY | Freq: Four times a day (QID) | RESPIRATORY_TRACT | Status: DC | PRN

## 2024-08-26 MED ORDER — CALCIUM CARBONATE ANTACID 500 MG PO CHEW
2.0000 | CHEWABLE_TABLET | Freq: Three times a day (TID) | ORAL | Status: DC
  Administered 2024-08-26 – 2024-08-27 (×3): 400 mg via ORAL
  Filled 2024-08-26 (×4): qty 2

## 2024-08-26 MED ORDER — OXYCODONE HCL 5 MG/5ML PO SOLN: 5.0000 mg | Freq: Once | ORAL | Status: DC | PRN

## 2024-08-26 MED ORDER — PANTOPRAZOLE SODIUM 40 MG PO TBEC
40.0000 mg | DELAYED_RELEASE_TABLET | Freq: Every day | ORAL | Status: DC
  Administered 2024-08-27: 40 mg via ORAL
  Filled 2024-08-26: qty 1

## 2024-08-26 MED ORDER — LACTATED RINGERS IV SOLN: INTRAVENOUS | Status: DC

## 2024-08-26 MED ORDER — FUROSEMIDE 20 MG PO TABS: 20.0000 mg | ORAL_TABLET | Freq: Every day | ORAL | Status: DC | PRN

## 2024-08-26 MED ORDER — HYDROMORPHONE HCL 1 MG/ML IJ SOLN
INTRAMUSCULAR | Status: AC
  Filled 2024-08-26: qty 1

## 2024-08-26 MED ORDER — DULOXETINE HCL 30 MG PO CPEP
30.0000 mg | ORAL_CAPSULE | Freq: Every day | ORAL | Status: DC
  Administered 2024-08-26 – 2024-08-27 (×2): 30 mg via ORAL
  Filled 2024-08-26 (×2): qty 1

## 2024-08-26 MED ORDER — ONDANSETRON HCL 4 MG PO TABS: 4.0000 mg | ORAL_TABLET | ORAL | Status: DC | PRN

## 2024-08-26 SURGICAL SUPPLY — 57 items
ATTRACTOMAT 16X20 MAGNETIC DRP (DRAPES) ×1 IMPLANT
BENZOIN TINCTURE PRP APPL 2/3 (GAUZE/BANDAGES/DRESSINGS) IMPLANT
BLADE SURG 15 STRL LF DISP TIS (BLADE) ×1 IMPLANT
CANISTER SUCTION 3000ML PPV (SUCTIONS) ×1 IMPLANT
CLIP TI MEDIUM 24 (CLIP) ×1 IMPLANT
CLIP TI WIDE RED SMALL 24 (CLIP) ×1 IMPLANT
CNTNR URN SCR LID CUP LEK RST (MISCELLANEOUS) IMPLANT
CORD BIPOLAR FORCEPS 12FT (ELECTRODE) ×1 IMPLANT
COVER SURGICAL LIGHT HANDLE (MISCELLANEOUS) ×1 IMPLANT
DERMABOND ADVANCED .7 DNX12 (GAUZE/BANDAGES/DRESSINGS) IMPLANT
DRAIN 10X20 FULL PER LF SIL ST (DRAIN) IMPLANT
DRAIN CHANNEL 10M FLAT 3/4 FLT (DRAIN) IMPLANT
DRAIN JACKSON PRT FLT 7MM (DRAIN) IMPLANT
DRAIN JP 10F RND RADIO (DRAIN) IMPLANT
DRAPE HALF SHEET 40X57 (DRAPES) IMPLANT
DRAPE UTILITY XL STRL (DRAPES) IMPLANT
DRSG TEGADERM 4X4.75 (GAUZE/BANDAGES/DRESSINGS) ×2 IMPLANT
ELECT COATED BLADE 2.86 ST (ELECTRODE) ×1 IMPLANT
ELECTRODE REM PT RTRN 9FT ADLT (ELECTROSURGICAL) ×1 IMPLANT
EVACUATOR SILICONE 100CC (DRAIN) IMPLANT
FORCEPS BIPOLAR SPETZLER 8 1.0 (NEUROSURGERY SUPPLIES) ×1 IMPLANT
GAUZE 4X4 16PLY ~~LOC~~+RFID DBL (SPONGE) ×1 IMPLANT
GLOVE BIO SURGEON STRL SZ 6 (GLOVE) ×1 IMPLANT
GLOVE BIO SURGEON STRL SZ7.5 (GLOVE) ×1 IMPLANT
GOWN STRL REUS W/ TWL LRG LVL3 (GOWN DISPOSABLE) ×2 IMPLANT
HEMOSTAT SURGICEL 2X14 (HEMOSTASIS) IMPLANT
KIT BASIN OR (CUSTOM PROCEDURE TRAY) ×1 IMPLANT
KIT TURNOVER KIT B (KITS) ×1 IMPLANT
LOCATOR NERVE 3 VOLT (DISPOSABLE) IMPLANT
MARKER SKIN DUAL TIP RULER LAB (MISCELLANEOUS) ×1 IMPLANT
NDL HYPO 25GX1X1/2 BEV (NEEDLE) ×1 IMPLANT
NEEDLE HYPO 25GX1X1/2 BEV (NEEDLE) ×1 IMPLANT
PAD ARMBOARD POSITIONER FOAM (MISCELLANEOUS) ×2 IMPLANT
PATTIES SURGICAL .5 X.5 (GAUZE/BANDAGES/DRESSINGS) IMPLANT
PENCIL BUTTON HOLSTER BLD 10FT (ELECTRODE) ×1 IMPLANT
POSITIONER HEAD DONUT 9IN (MISCELLANEOUS) IMPLANT
PROBE NERVBE PRASS .33 (MISCELLANEOUS) ×1 IMPLANT
RETRACTOR STAY HOOK 5MM (MISCELLANEOUS) ×1 IMPLANT
SET WALTER ACTIVATION W/DRAPE (SET/KITS/TRAYS/PACK) IMPLANT
SHEARS HARMONIC 9CM CVD (BLADE) ×1 IMPLANT
SOLN 0.9% NACL 1000 ML (IV SOLUTION) ×1 IMPLANT
SOLN 0.9% NACL POUR BTL 1000ML (IV SOLUTION) ×1 IMPLANT
SPONGE INTESTINAL PEANUT (DISPOSABLE) ×1 IMPLANT
STAPLER SKIN PROX 35W (STAPLE) ×1 IMPLANT
STRIP CLOSURE SKIN 1/2X4 (GAUZE/BANDAGES/DRESSINGS) IMPLANT
SUT MNCRL AB 4-0 PS2 18 (SUTURE) IMPLANT
SUT MON AB 5-0 PS2 18 (SUTURE) IMPLANT
SUT SILK 2 0 SH (SUTURE) ×1 IMPLANT
SUT SILK 2 0 TIES 10X30 (SUTURE) ×1 IMPLANT
SUT SILK 2 0SH CR/8 30 (SUTURE) IMPLANT
SUT SILK 3 0 SH 30 (SUTURE) IMPLANT
SUT SILK 3 0 TIES 10X30 (SUTURE) ×1 IMPLANT
SUT SILK 3 0SH CR/8 30 (SUTURE) IMPLANT
SUT VIC AB 3-0 SH 27X BRD (SUTURE) IMPLANT
SUT VIC AB 4-0 PS2 18 (SUTURE) ×2 IMPLANT
TOWEL GREEN STERILE FF (TOWEL DISPOSABLE) ×1 IMPLANT
TRAY ENT MC OR (CUSTOM PROCEDURE TRAY) ×1 IMPLANT

## 2024-08-26 NOTE — Anesthesia Preprocedure Evaluation (Addendum)
 Anesthesia Evaluation  Patient identified by MRN, date of birth, ID band Patient awake    Reviewed: Allergy & Precautions, H&P , NPO status , Patient's Chart, lab work & pertinent test results  Airway Mallampati: II  TM Distance: >3 FB Neck ROM: Full    Dental  (+) Dental Advisory Given   Pulmonary asthma , sleep apnea , former smoker   Pulmonary exam normal breath sounds clear to auscultation       Cardiovascular hypertension, Pt. on medications negative cardio ROS Normal cardiovascular exam Rhythm:Regular Rate:Normal     Neuro/Psych  Headaches   Depression     negative psych ROS   GI/Hepatic Neg liver ROS,GERD  ,,  Endo/Other  negative endocrine ROSdiabetes, Type 2    Renal/GU negative Renal ROS  negative genitourinary   Musculoskeletal  (+) Arthritis ,  Fibromyalgia -  Abdominal  (+) + obese  Peds negative pediatric ROS (+)  Hematology negative hematology ROS (+)   Anesthesia Other Findings   Reproductive/Obstetrics negative OB ROS                              Anesthesia Physical Anesthesia Plan  ASA: 3  Anesthesia Plan: General   Post-op Pain Management:    Induction: Intravenous  PONV Risk Score and Plan: 3 and Ondansetron , Dexamethasone , Midazolam and Treatment may vary due to age or medical condition  Airway Management Planned: Oral ETT  Additional Equipment:   Intra-op Plan:   Post-operative Plan: Extubation in OR  Informed Consent: I have reviewed the patients History and Physical, chart, labs and discussed the procedure including the risks, benefits and alternatives for the proposed anesthesia with the patient or authorized representative who has indicated his/her understanding and acceptance.     Dental advisory given  Plan Discussed with: CRNA  Anesthesia Plan Comments:         Anesthesia Quick Evaluation

## 2024-08-26 NOTE — H&P (Signed)
 Sylvia Alvarado is an 53 y.o. female.    Chief Complaint:  thyroid  goiter  HPI: Patient presents today for planned elective procedure.  He/she denies any interval change in history since office visit on 07/25/24  Past Medical History:  Diagnosis Date   Abnormal Papanicolaou smear of cervix with positive human papilloma virus (HPV) test 12/17/2018   LSIL +HPV, will schedule colpo   Allergy    Arthritis    Asthma    Cervical high risk HPV (human papillomavirus) test positive 08/20/2017   Depression    situational   Diabetes mellitus type 2 in obese 06/15/2021   Fatty liver    GERD (gastroesophageal reflux disease)    Goiter    Hypertension    Low back pain    Obesity    OSA (obstructive sleep apnea)    on CPAP Dr Corrie   Vaginal Pap smear, abnormal     Past Surgical History:  Procedure Laterality Date   ARTHROSCOPY KNEE W/ DRILLING Left    BREAST REDUCTION SURGERY  1993   ENDOMETRIAL ABLATION     REDUCTION MAMMAPLASTY Bilateral    TUBAL LIGATION      Family History  Problem Relation Age of Onset   Cancer Mother        Uterine   Stroke Mother    Heart disease Mother 79   Emphysema Father    Heart disease Father    Emphysema Sister    Endometriosis Sister    Breast cancer Maternal Grandmother    Cancer Maternal Grandmother 77       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 Pt's mom has a 50% chance - not tested yet.    Fibroids Maternal Grandmother    Cancer Maternal Grandfather        Brain   Arthritis Other    Heart disease Other        A Fib    Social History:  reports that she has quit smoking. She has never used smokeless tobacco. She reports current alcohol use. She reports that she does not use drugs.  Allergies:  Allergies  Allergen Reactions   Ivp Dye [Iodinated Contrast Media] Hives and Shortness Of Breath   Butrans  [Buprenorphine ] Nausea Only     Nausea, dizziness   Ropinirole Hydrochloride Other (See Comments)    Mood changes and sleep walking    Medications Prior to Admission  Medication Sig Dispense Refill   albuterol  (VENTOLIN  HFA) 108 (90 Base) MCG/ACT inhaler Inhale 2 puffs into the lungs every 6 (six) hours as needed for wheezing or shortness of breath. 18 g 11   amphetamine -dextroamphetamine  (ADDERALL XR) 10 MG 24 hr capsule Take 1 capsule (10 mg total) by mouth daily. 30 capsule 0   azelastine  (ASTELIN ) 0.1 % nasal spray Place 2 sprays into both nostrils 2 (two) times daily. Use in each nostril as directed (Patient taking differently: Place 2 sprays into both nostrils 2 (two) times daily as needed for rhinitis or allergies. Use in each nostril as directed) 30 mL 12   B Complex-C (SUPER B COMPLEX PO) Take by mouth.     Cholecalciferol (VITAMIN D3) 50 MCG (2000 UT) capsule Take 1 capsule (2,000 Units total) by mouth daily. 100 capsule 3   clonazePAM  (KLONOPIN ) 1 MG tablet Take 1 mg by mouth 2 (two) times daily as needed for anxiety.     cyclobenzaprine  (FLEXERIL ) 10 MG tablet  TAKE 1 TABLET BY MOUTH TWICE DAILY (Patient taking differently: Take 10 mg by mouth 2 (two) times daily as needed for muscle spasms.) 60 tablet 3   DULoxetine  (CYMBALTA ) 30 MG capsule Take 1 capsule (30 mg total) by mouth daily. 90 capsule 1   fluticasone  (FLONASE ) 50 MCG/ACT nasal spray PLACE TWO SPRAYS INTO BOTH NOSTRILS DAILY (Patient taking differently: Place 2 sprays into both nostrils daily as needed for allergies.) 16 g 6   furosemide  (LASIX ) 20 MG tablet Take 1 tablet (20 mg total) by mouth daily as needed. 30 tablet 3   metFORMIN  (GLUCOPHAGE ) 500 MG tablet Take 1 tablet (500 mg total) by mouth 2 (two) times daily with a meal. 180 tablet 3   metoprolol  succinate (TOPROL -XL) 25 MG 24 hr tablet TAKE 1 TABLET BY MOUTH EVERY DAY 90 tablet 2   pantoprazole  (PROTONIX ) 40 MG tablet TAKE 1 TABLET BY MOUTH DAILY 90 tablet 3   tirzepatide  (MOUNJARO ) 2.5 MG/0.5ML  Pen Inject 2.5 mg into the skin once a week. 2 mL 3   UNABLE TO FIND Take 2 capsules by mouth daily. [place]     ADVAIR DISKUS 100-50 MCG/ACT AEPB INHALE 1 PUFF into THE lungs TWICE DAILY (Patient not taking: Reported on 08/18/2024) 60 each 11   amphetamine -dextroamphetamine  (ADDERALL XR) 10 MG 24 hr capsule Take 1 capsule (10 mg total) by mouth daily. 30 capsule 0   amphetamine -dextroamphetamine  (ADDERALL XR) 10 MG 24 hr capsule Take 1 capsule (10 mg total) by mouth daily. 30 capsule 0   cefdinir  (OMNICEF ) 300 MG capsule Take 1 capsule (300 mg total) by mouth 2 (two) times daily. (Patient not taking: Reported on 08/18/2024) 20 capsule 0   clonazePAM  (KLONOPIN ) 0.5 MG tablet Take 1 tablet (0.5 mg total) by mouth 2 (two) times daily as needed for anxiety. (Patient not taking: Reported on 08/18/2024) 60 tablet 1   levocetirizine (XYZAL  ALLERGY 24HR) 5 MG tablet Take 1 tablet (5 mg total) by mouth every evening for 14 days. (Patient not taking: Reported on 08/18/2024) 14 tablet 0   meloxicam  (MOBIC ) 15 MG tablet TAKE 1 TABLET BY MOUTH EVERY DAY (Patient not taking: No sig reported) 30 tablet 2   triamcinolone  cream (KENALOG ) 0.1 % APPLY TO THE AFFECTED AREA(S) FOUR TIMES DAILY (Patient taking differently: Apply 1 Application topically 4 (four) times daily as needed (irritation).) 80 g 0    Results for orders placed or performed during the hospital encounter of 08/26/24 (from the past 48 hours)  Glucose, capillary     Status: Abnormal   Collection Time: 08/26/24  6:54 AM  Result Value Ref Range   Glucose-Capillary 141 (H) 70 - 99 mg/dL    Comment: Glucose reference range applies only to samples taken after fasting for at least 8 hours.   No results found.  ROS:  ROS: Constitutional: Negative for fever, weight loss and weight gain. Cardiovascular: Negative for chest pain and dyspnea on exertion. Respiratory: Is not experiencing shortness of breath at rest. Gastrointestinal: Negative for nausea  and vomiting. Neurological: Negative for headaches. Psychiatric: The patient is not nervous/anxious   Blood pressure (!) 157/87, pulse 86, temperature 98 F (36.7 C), temperature source Oral, resp. rate 18, height 5' 6 (1.676 m), weight (!) 138.3 kg, last menstrual period 03/17/2008, SpO2 95%.  PHYSICAL EXAM: Exam: General: Well-developed, well-nourished Respiratory Respiratory effort: Equal inspiration and expiration without stridor Cardiovascular Peripheral Vascular: Warm extremities with equal color/perfusion Eyes: No nystagmus with equal extraocular motion bilaterally Neuro/Psych/Balance: Patient  oriented to person, place, and time; Appropriate mood and affect; Gait is intact with no imbalance; Cranial nerves I-XII are intact Head and Face Inspection: Normocephalic and atraumatic without mass or lesion Palpation: Facial skeleton intact without bony stepoffs Salivary Glands: No mass or tenderness  Facial Strength: Facial motility symmetric and full bilaterally ENT Pinna: External ear intact and fully developed External canal: Canal is patent with intact skin Tympanic Membrane: Clear and mobile External Nose: No scar or anatomic deformity Internal Nose: Septum is relatively straight on anterior rhinoscopy. Bilateral inferior turbinate hypertrophy.  Lips, Teeth, and gums: Mucosa and teeth intact and viable Oral cavity/oropharynx: No erythema or exudate, no lesions present Neck Neck and Trachea: Midline trachea without mass or lesion Thyroid : No mass or nodularity Lymphatics: No lymphadenopathy    Assessment/Plan Thyroid  goiter  - risks and benefits discussed and he would like to proceed    Tracer Gutridge 08/26/2024, 7:13 AM

## 2024-08-26 NOTE — Progress Notes (Signed)
 Patient arrived to 6N30 with a 7/10 headache. JP drain 0ml. Patient ambulatory with no assistance, no complaints of dizziness or nausea. Patient voided, bed in lowest position and call light within reach.

## 2024-08-26 NOTE — Op Note (Signed)
 Operative Report  PREOPERATIVE DIAGNOSIS: 1. Large multinodular goiter 2. Compressive symptoms   POSTOPERATIVE DIAGNOSIS: 1. Large multinodular goiter 2. Compressive symptoms   Procedure: Total Thyroidectomy with Recurrent Laryngeal nerve monitoring  Surgeon: Elena Larry, MD  Assistant: First Assist Keven Piety, RN  IVF: per anesthesia  Anesthesia: General  Specimen:  Total thyroid  gland right superior pole marked with a stitch  Indication: The patient has been seen in the office for a large thyroid  goiter and symptoms of neck tightness and and trouble with swallowing.  Risks and Benefits of surgery (total thyroidectomy) were discussed with the patient. The patient elected to proceed with total thyroidectomy. Informed consent was signed.   Operative Procedure:  The patient was correctly identified and brought to the operating room and placed in supine position. An endotracheal tube was placed without difficulty and the head of bed rotated 180 degrees with respect to anesthesia.This was a recurrent laryngeal nerve monitoring tube and it was verified to be working accurately. The patient was then prepped and draped in a sterile fashion.  An incision was made in a prominent cervical skin crease and about 2 fingerbreaths above the sternal notch. Next an incision was made through the platysma with a knife. Subplatysmal flaps were lifted towards the thyroid  notch superiorly and to the sternal notch inferiorly. Next the straps were divided in the median raphe, which appeared to be displaced due to a large right thyroid  nodule, and there was evidence of some scarring along the plane between the nodule and the right thyroid  lobe. The isthmus was identified and both thyroid  lobes were exposed.   We first began on the right side, the side that contained a 4 cm large thyroid  nodule with concerning findings on FNA results. The gland was freed of attachments to the strap muscles. Next the  superior pole of the thyroid  was identified and inferiorly reflected. The superior thyroid  artery and vein were identified and ligated. This allowed for medial rotation of the gland and the middle thyroid  vein was ligated. Once freed, the inferior thyroid  artery and vein were identified and ligated as well. Finally, the recurrent laryngeal nerve was identified and followed into the cricoid cartilage at [facility] ligament. Care was taken to identify the parathyroid glands in the inferior and superior poles. The vascular supply was identified and ligated medial and on the capsule of the thyroid  to preserve the functionality. In dissecting the superior parathyroid, the RLN was identified as it entered the cricothyroid junction. The thyroid  was carefully freed in this location while continuously maintaining the integrity of the nerve. The thyroid  was then dissected with Bipolar cautery off of the anterior tracheal cartilage rings. Hemostasis was obtained.   We then turned our attention to the left thyroid  lobe, which was removed in a similar fashion. We first isolated the superior pole of the left thyroid . The superior thyroid  artery and vein were identified and ligated. This allowed for medial rotation of the gland and the middle thyroid  vein was ligated. Once freed, the inferior thyroid  artery and vein were identified and ligated. Finally, the recurrent laryngeal nerve was identified and followed into the cricoid cartilage at [facility] ligament. Care was taken to identify the parathyroid glands in the inferior and superior poles. The vascular supply was identified and ligated medial and on the capsule of the thyroid  to preserve the functionality. In dissecting the superior parathyroid, the RLN was identified as it entered the cricothyroid junction. The thyroid  was carefully freed in this location while continuously  maintaining the integrity of the nerve. The left thyroid  lobe was then dissected with Bipolar cautery off  of the anterior tracheal cartilage rings and the entire thyroid  gland was removed. Hemostasis was obtained. Superior stitch was placed to identify the orientation of the specimen, which was sent for permanent histopathology. At this point we used a hand-held monitor to stimulate both recurrent laryngeal nerves to ensure nerve integrity, and there was good signal on both sides.   The wound was copiously irrigated and a valsalva done to make sure no additional bleeding was noted. The wound was closed in layers: first the strap muscles were re-approximated, followed by the platysma, and finally the skin in a subcuticular fashion. A JP drain was placed as well prior to closure and secured to the skin. The patient was returned to anesthesia and taken to PACU in stable condition.   The First Assist RN assisted throughout the case. Their help was essential in retraction and counter traction when needed to make the case progress smoothly. This retraction and assistance made it possible to see the tissue plains for the procedure. The assistance was needed for tissue re-approximation and assisted with closure of the incision site

## 2024-08-26 NOTE — Anesthesia Procedure Notes (Signed)
 Procedure Name: Intubation Date/Time: 08/26/2024 9:24 AM  Performed by: Mollie Olivia SAUNDERS, CRNAPre-anesthesia Checklist: Patient identified, Emergency Drugs available, Suction available and Patient being monitored Patient Re-evaluated:Patient Re-evaluated prior to induction Oxygen  Delivery Method: Circle system utilized Preoxygenation: Pre-oxygenation with 100% oxygen  Induction Type: IV induction Ventilation: Mask ventilation without difficulty Laryngoscope Size: Glidescope and 3 Grade View: Grade I Tube type: Oral (NIM ETT) Tube size: 7.0 mm Number of attempts: 1 Airway Equipment and Method: Oral airway, Video-laryngoscopy and Rigid stylet Placement Confirmation: ETT inserted through vocal cords under direct vision, positive ETCO2 and breath sounds checked- equal and bilateral Secured at: 22 cm Tube secured with: Tape Dental Injury: Teeth and Oropharynx as per pre-operative assessment  Comments: To visualize placement of NIM ETT

## 2024-08-26 NOTE — Transfer of Care (Signed)
 Immediate Anesthesia Transfer of Care Note  Patient: Sylvia Alvarado  Procedure(s) Performed: TOTAL THYROIDECTOMY (Neck)  Patient Location: PACU  Anesthesia Type:General  Level of Consciousness: awake, oriented, and drowsy  Airway & Oxygen  Therapy: Patient Spontanous Breathing and Patient connected to face mask oxygen   Post-op Assessment: Report given to RN, Post -op Vital signs reviewed and stable, and Patient moving all extremities X 4  Post vital signs: Reviewed and stable  Last Vitals:  Vitals Value Taken Time  BP 165/97 08/26/24 11:45  Temp    Pulse 83 08/26/24 11:51  Resp 25 08/26/24 11:51  SpO2 93 % 08/26/24 11:51  Vitals shown include unfiled device data.  Last Pain:  Vitals:   08/26/24 1148  TempSrc:   PainSc: 6          Complications: No notable events documented.

## 2024-08-26 NOTE — Plan of Care (Signed)

## 2024-08-26 NOTE — Anesthesia Postprocedure Evaluation (Signed)
 Anesthesia Post Note  Patient: Sylvia Alvarado  Procedure(s) Performed: TOTAL THYROIDECTOMY (Neck)     Patient location during evaluation: PACU Anesthesia Type: General Level of consciousness: awake and alert Pain management: pain level controlled Vital Signs Assessment: post-procedure vital signs reviewed and stable Respiratory status: spontaneous breathing, nonlabored ventilation and respiratory function stable Cardiovascular status: blood pressure returned to baseline and stable Postop Assessment: no apparent nausea or vomiting Anesthetic complications: no   No notable events documented.  Last Vitals:  Vitals:   08/26/24 1245 08/26/24 1315  BP: (!) 170/100   Pulse: 74   Resp: 18   Temp:    SpO2: 93% 94%    Last Pain:  Vitals:   08/26/24 1245  TempSrc:   PainSc: 0-No pain                 Butler Levander Pinal

## 2024-08-27 ENCOUNTER — Encounter (HOSPITAL_COMMUNITY): Payer: Self-pay | Admitting: Otolaryngology

## 2024-08-27 DIAGNOSIS — E042 Nontoxic multinodular goiter: Secondary | ICD-10-CM | POA: Diagnosis not present

## 2024-08-27 LAB — GLUCOSE, CAPILLARY: Glucose-Capillary: 146 mg/dL — ABNORMAL HIGH (ref 70–99)

## 2024-08-27 LAB — CALCIUM: Calcium: 9.1 mg/dL (ref 8.9–10.3)

## 2024-08-27 MED ORDER — OXYCODONE HCL 5 MG PO TABS: 5.0000 mg | ORAL_TABLET | Freq: Four times a day (QID) | ORAL | 0 refills | Status: AC | PRN

## 2024-08-27 MED ORDER — CALCIUM CARBONATE ANTACID 500 MG PO CHEW: 2.0000 | CHEWABLE_TABLET | Freq: Three times a day (TID) | ORAL | 1 refills | Status: AC

## 2024-08-27 MED ORDER — LEVOTHYROXINE SODIUM 200 MCG PO TABS: 200.0000 ug | ORAL_TABLET | Freq: Every day | ORAL | 1 refills | Status: DC

## 2024-08-27 MED ORDER — ACETAMINOPHEN 500 MG PO TABS: 500.0000 mg | ORAL_TABLET | Freq: Four times a day (QID) | ORAL | 0 refills | Status: AC

## 2024-08-27 MED ORDER — IBUPROFEN 600 MG PO TABS: 600.0000 mg | ORAL_TABLET | Freq: Four times a day (QID) | ORAL | 0 refills | Status: AC

## 2024-08-27 NOTE — Progress Notes (Signed)
 IV removed-CDI. Reviewed d/c paperwork with patient and answered questions. Did drain teaching with patient with teachback.

## 2024-08-27 NOTE — Discharge Summary (Signed)
 Physician Discharge Summary  Patient ID: Sylvia Alvarado MRN: [id number] DOB/AGE: November 20, 1970 53 y.o.  Admit date: 08/26/2024 Discharge date: 08/27/2024  Admission Diagnoses:  Discharge Diagnoses:  Principal Problem:   Thyroid  goiter Active Problems:   Dysphagia   Neck tightness   Discharged Condition: good  Hospital Course:  84 yoF who was admitted for observation following total thyroidectomy for thyroid  goiter. Her calcium level was normal POD#1, she was tolerating regular diet, and had no significant pain. She was discharged home with JP drain and a plan to remove JP in the office.    Discharge Exam: Blood pressure (!) 150/91, pulse 78, temperature 97.6 F (36.4 C), temperature source Oral, resp. rate 14, height 5' 6 (1.676 m), weight (!) 138.3 kg, last menstrual period 03/17/2008, SpO2 95%.  Exam: General: Well-developed, well-nourished Respiratory Respiratory effort: Equal inspiration and expiration without stridor Cardiovascular Peripheral Vascular: Warm extremities with equal color/perfusion Eyes: No nystagmus with equal extraocular motion bilaterally Neuro/Psych/Balance: Patient oriented to person, place, and time; Appropriate mood and affect; Gait is intact with no imbalance; Cranial nerves I-XII are intact Head and Face Inspection: Normocephalic and atraumatic without mass or lesion Palpation: Facial skeleton intact without bony stepoffs Salivary Glands: No mass or tenderness  Facial Strength: Facial motility symmetric and full bilaterally ENT Pinna: External ear intact and fully developed External canal: Canal is patent with intact skin Tympanic Membrane: Clear and mobile External Nose: No scar or anatomic deformity Internal Nose: Septum is relatively straight on anterior rhinoscopy. Bilateral inferior turbinate hypertrophy.  Lips, Teeth, and gums: Mucosa and teeth intact and viable Oral cavity/oropharynx: No erythema or exudate, no lesions  present Neck Neck and Trachea: Midline trachea without mass or lesion incision c/d/I JP in place  Thyroid : No mass or nodularity Lymphatics: No lymphadenopathy   Disposition:    Allergies as of 08/27/2024       Reactions   Ivp Dye [iodinated Contrast Media] Hives, Shortness Of Breath   Butrans  [buprenorphine ] Nausea Only   Nausea, dizziness   Ropinirole Hydrochloride Other (See Comments)   Mood changes and sleep walking        Medication List     TAKE these medications    acetaminophen  500 MG tablet Commonly known as: TYLENOL  Take 1 tablet (500 mg total) by mouth every 6 (six) hours. Take every 6 hrs x 2-3 days then take every 6 hrs as needed   Advair Diskus 100-50 MCG/ACT Aepb Generic drug: fluticasone -salmeterol INHALE 1 PUFF into THE lungs TWICE DAILY   albuterol  108 (90 Base) MCG/ACT inhaler Commonly known as: VENTOLIN  HFA Inhale 2 puffs into the lungs every 6 (six) hours as needed for wheezing or shortness of breath.   amphetamine -dextroamphetamine  10 MG 24 hr capsule Commonly known as: ADDERALL XR Take 1 capsule (10 mg total) by mouth daily.   amphetamine -dextroamphetamine  10 MG 24 hr capsule Commonly known as: Adderall XR Take 1 capsule (10 mg total) by mouth daily.   amphetamine -dextroamphetamine  10 MG 24 hr capsule Commonly known as: Adderall XR Take 1 capsule (10 mg total) by mouth daily.   azelastine  0.1 % nasal spray Commonly known as: ASTELIN  Place 2 sprays into both nostrils 2 (two) times daily. Use in each nostril as directed What changed:  when to take this reasons to take this   calcium carbonate 500 MG chewable tablet Commonly known as: TUMS - dosed in mg elemental calcium Chew 2 tablets (400 mg of elemental calcium total) by mouth 3 (three) times daily.  cefdinir  300 MG capsule Commonly known as: OMNICEF  Take 1 capsule (300 mg total) by mouth 2 (two) times daily.   clonazePAM  0.5 MG tablet Commonly known as: KLONOPIN  Take 1  tablet (0.5 mg total) by mouth 2 (two) times daily as needed for anxiety.   clonazePAM  1 MG tablet Commonly known as: KLONOPIN  Take 1 mg by mouth 2 (two) times daily as needed for anxiety.   cyclobenzaprine  10 MG tablet Commonly known as: FLEXERIL  TAKE 1 TABLET BY MOUTH TWICE DAILY What changed:  when to take this reasons to take this   DULoxetine  30 MG capsule Commonly known as: CYMBALTA  Take 1 capsule (30 mg total) by mouth daily.   fluticasone  50 MCG/ACT nasal spray Commonly known as: FLONASE  PLACE TWO SPRAYS INTO BOTH NOSTRILS DAILY What changed: See the new instructions.   furosemide  20 MG tablet Commonly known as: LASIX  Take 1 tablet (20 mg total) by mouth daily as needed.   ibuprofen 600 MG tablet Commonly known as: ADVIL Take 1 tablet (600 mg total) by mouth every 6 (six) hours. Please take every 6 hrs, and stagger this medication 3 hrs apart from Tylenol    levocetirizine 5 MG tablet Commonly known as: Xyzal  Allergy 24HR Take 1 tablet (5 mg total) by mouth every evening for 14 days.   levothyroxine 200 MCG tablet Commonly known as: SYNTHROID Take 1 tablet (200 mcg total) by mouth daily at 6 (six) AM. Start taking on: August 28, 2024   meloxicam  15 MG tablet Commonly known as: MOBIC  TAKE 1 TABLET BY MOUTH EVERY DAY   metFORMIN  500 MG tablet Commonly known as: GLUCOPHAGE  Take 1 tablet (500 mg total) by mouth 2 (two) times daily with a meal.   metoprolol  succinate 25 MG 24 hr tablet Commonly known as: TOPROL -XL TAKE 1 TABLET BY MOUTH EVERY DAY   oxyCODONE  5 MG immediate release tablet Commonly known as: Oxy IR/ROXICODONE  Take 1 tablet (5 mg total) by mouth every 6 (six) hours as needed for breakthrough pain or severe pain (pain score 7-10).   pantoprazole  40 MG tablet Commonly known as: PROTONIX  TAKE 1 TABLET BY MOUTH DAILY   SUPER B COMPLEX PO Take by mouth.   tirzepatide  2.5 MG/0.5ML Pen Commonly known as: MOUNJARO  Inject 2.5 mg into the skin  once a week.   triamcinolone  cream 0.1 % Commonly known as: KENALOG  APPLY TO THE AFFECTED AREA(S) FOUR TIMES DAILY What changed: See the new instructions.   UNABLE TO FIND Take 2 capsules by mouth daily. [place]   Vitamin D3 50 MCG (2000 UT) capsule Take 1 capsule (2,000 Units total) by mouth daily.         Signed: Mischele Detter 08/27/2024, 8:17 AM

## 2024-08-27 NOTE — Discharge Instructions (Signed)
 This post-operative instruction sheet is designed to help you after surgery and help answer any of the common questions you may have. It is not entirely comprehensive, so if you have any questions, do not hesitate to call the office 24 hours a day.  What to expect: - You will see swelling or bruising under the incision in a few days. This is usually greatest on the second or third day after the operation. You also may feel the sensation of swelling and/or firmness which can last for a month or more. - Most patients experience very little pain from the incision and may complain more about a sore throat from the breathing tube. You may experience neck stiffness or soreness in your shoulders, back or neck. - Some patients also notice minor changes in swallowing, which improve over time. - The skin just above and below your incision will feel numb. This will improve over several months though some patients may have a permanent decrease in sensation in this area. - Your voice may be slightly hoarse or weak after surgery. This is normal and does not mean there was damage to the nerves that make the vocal cords move. The breathing tube used during surgery often irritates the vocal cords. Your voice usually will go back to normal within several days to a few weeks.   Wound Care and Hygiene:  Hygiene - You may shower/bathe 24-48 hours after surgery.  Do not let the water from the shower stream hit the incision directly. Do not soak incision until you are seen in follow up.  Incision care: - A special skin glue called Dermabond with Steri-strips is used to close and cover your incision. It is to stay in place for 10 to 14 days and wears off on its own. The glue is waterproof but you should not soak your incision or take a tub bath for 2 weeks because it could loosen the glue too soon. Do not pick or peel the glue off your skin. You should remove your post-operative dressing the day after your surgery. - You do  not need to cover the incision with saran wrap to shower since the dressing is waterproof. - Do not apply ointments or powders to the incision. - No further care is needed unless you have a problem. - The incision will be raised and red, but will eventually fade over the course of a year. - All incisions are sensitive to sunlight. For one year after surgery, you should use sunscreen when outdoors for long periods to prevent darkening of the scar area.  Nutrition after Surgery: - Start your regular diet today. No dietary restrictions after this  Activity: - Limit your activity for 24 hours. - Avoid vigorous physical activity while the stitches are in place - this includes heavy lifting, running, and other sporting activities for at least 2 weeks. Avoid activities that pull or stretch on the area with stitches. - Sleep with head or surgery site elevated using several pillows when possible.   Medications: - Pain Medication - A prescription for pain medicine Oxycodone will be sent home with you. Do not drive while taking prescription pain medicine (narcotic pain medication) and only take it if you need it. Eat when taking pain medicines to avoid nausea. Watch for constipation. Eat plenty of fruits, vegetables, juices, and drink 6 to 8 glasses of water each day. - Take scheduled Tylenol and Motrin every 6  hrs staggered 3 hrs apart from each other. Follow the dose  as label directs. If you still having significant pain, take Oxycodone.  - Take an over the counter stool softeners twice daily while taking the narcotic pain medicine to prevent constipation. - Take a stool softener twice a day as long as you remain on narcotic pain medicine. If you do not have a bowel movement within 5 days of your surgery, please take milk of magnesia. If you do not have a bowel movement within 12 hours of milk of magnesia, call the office.   - Levothyroxine (Synthroid) - This is a thyroid hormone replacement. This  medicine can improve symptoms of thyroid deficiency - Take this medicine by mouth with plenty of water. It is best to take on an empty stomach, at least 30 minutes before or 2 hours after food. Follow the directions on the prescription label. Take at the same time each day. Do not take your medicine more often than directed. - You will need regular exams and occasional blood tests to check the response to treatment. This will be done by your Endocrinologist  - You should take your thyroid hormone medication on an empty stomach and by itself. Avoid taking calcium or any other medication within an hour of taking your thyroid hormone pill. - A blood test will be done in 6-8 weeks to ensure the amount prescribed is correct. If you have thyroid cancer, you may be placed on liothyronine (Cytomel) instead of levothyroxine. - Calcium Supplement: - Your body's calcium level may decrease after having a total thyroidectomy. We recommend you take calcium carbonate supplement as prescribed.   If you have nausea, take Zofran, an anti-nausea medication which we prescribed for you  Anesthesia Precautions & Expectations: - After anesthesia, rest for 24 hours.  Do not drive, drink alcoholic beverages or make any important decisions during this time.   - General anesthesia may cause a sore throat, jaw discomfort or muscle aches.  These symptoms can last for one or two days.  Call Dr Leighton Roach office if you experience any of the following: - chest pain or shortness of breath - persistent fever of 100.5  - nausea/vomiting - neck swelling or noisy breathing  - numbness of tingling in your fingertips or around your lips  - significant redness or purulent drainage abound the neck incision

## 2024-08-27 NOTE — Progress Notes (Signed)
 ENT PROGRESS NOTE   Subjective: Patient seen and examined at bedside. No issues overnight. Voice is strong. Denies pain. Denies numbness or tingling. Tolerating regular diet.   Objective: Vital signs in last 24 hours: Temp:  [97.5 F (36.4 C)-98.4 F (36.9 C)] 97.6 F (36.4 C) (10/15 0600) Pulse Rate:  [72-90] 78 (10/15 0600) Resp:  [14-26] 14 (10/14 1652) BP: (144-175)/(85-102) 150/91 (10/15 0600) SpO2:  [91 %-97 %] 95 % (10/15 0600)  PE: Exam: General: Well-developed, well-nourished Respiratory Respiratory effort: Equal inspiration and expiration without stridor Cardiovascular Peripheral Vascular: Warm extremities with equal color/perfusion Eyes: No nystagmus with equal extraocular motion bilaterally Neuro/Psych/Balance: Patient oriented to person, place, and time; Appropriate mood and affect; Gait is intact with no imbalance; Cranial nerves I-XII are intact Head and Face Inspection: Normocephalic and atraumatic without mass or lesion Palpation: Facial skeleton intact without bony stepoffs Salivary Glands: No mass or tenderness  Facial Strength: Facial motility symmetric and full bilaterally ENT Pinna: External ear intact and fully developed External canal: Canal is patent with intact skin Tympanic Membrane: Clear and mobile External Nose: No scar or anatomic deformity Internal Nose: Septum is relatively straight on anterior rhinoscopy. Bilateral inferior turbinate hypertrophy.  Lips, Teeth, and gums: Mucosa and teeth intact and viable Oral cavity/oropharynx: No erythema or exudate, no lesions present Neck Neck and Trachea: Midline trachea without mass or lesion Incision c/d/I, JP with serosanguinous output  Thyroid : No mass or nodularity Lymphatics: No lymphadenopathy  JP output 86 ml  labs Recent Labs    08/26/24 1218 08/27/24 0459  CREATININE 0.85  --   CALCIUM 8.8* 9.1     Assessment/Plan: POD#1 s/p total thyroidectomy, doing well  - d/c home today on  Synthroid and TUMS with JP - will return to clinic for JP removal tomorrow   LOS: 0 days    Elena Larry, MD 08/27/2024, 8:14 AM

## 2024-08-27 NOTE — Plan of Care (Signed)
   Problem: Education: Goal: Knowledge of General Education information will improve Description: Including pain rating scale, medication(s)/side effects and non-pharmacologic comfort measures Outcome: Progressing   Problem: Health Behavior/Discharge Planning: Goal: Ability to manage health-related needs will improve Outcome: Progressing   Problem: Nutrition: Goal: Adequate nutrition will be maintained Outcome: Progressing   Problem: Coping: Goal: Level of anxiety will decrease Outcome: Progressing   Problem: Pain Managment: Goal: General experience of comfort will improve and/or be controlled Outcome: Progressing

## 2024-08-27 NOTE — Plan of Care (Signed)
  Problem: Education: Goal: Knowledge of General Education information will improve Description: Including pain rating scale, medication(s)/side effects and non-pharmacologic comfort measures Outcome: Adequate for Discharge   Problem: Health Behavior/Discharge Planning: Goal: Ability to manage health-related needs will improve Outcome: Adequate for Discharge   Problem: Clinical Measurements: Goal: Ability to maintain clinical measurements within normal limits will improve Outcome: Adequate for Discharge Goal: Will remain free from infection Outcome: Adequate for Discharge Goal: Diagnostic test results will improve Outcome: Adequate for Discharge Goal: Respiratory complications will improve Outcome: Adequate for Discharge Goal: Cardiovascular complication will be avoided Outcome: Adequate for Discharge   Problem: Activity: Goal: Risk for activity intolerance will decrease Outcome: Adequate for Discharge   Problem: Nutrition: Goal: Adequate nutrition will be maintained Outcome: Adequate for Discharge   Problem: Coping: Goal: Level of anxiety will decrease Outcome: Adequate for Discharge   Problem: Elimination: Goal: Will not experience complications related to bowel motility Outcome: Adequate for Discharge Goal: Will not experience complications related to urinary retention Outcome: Adequate for Discharge   Problem: Pain Managment: Goal: General experience of comfort will improve and/or be controlled Outcome: Adequate for Discharge   Problem: Safety: Goal: Ability to remain free from injury will improve Outcome: Adequate for Discharge   Problem: Skin Integrity: Goal: Risk for impaired skin integrity will decrease Outcome: Adequate for Discharge   Problem: Education: Goal: Ability to describe self-care measures that may prevent or decrease complications (Diabetes Survival Skills Education) will improve Outcome: Adequate for Discharge Goal: Individualized Educational  Video(s) Outcome: Adequate for Discharge   Problem: Coping: Goal: Ability to adjust to condition or change in health will improve Outcome: Adequate for Discharge   Problem: Fluid Volume: Goal: Ability to maintain a balanced intake and output will improve Outcome: Adequate for Discharge   Problem: Health Behavior/Discharge Planning: Goal: Ability to identify and utilize available resources and services will improve Outcome: Adequate for Discharge Goal: Ability to manage health-related needs will improve Outcome: Adequate for Discharge   Problem: Metabolic: Goal: Ability to maintain appropriate glucose levels will improve Outcome: Adequate for Discharge   Problem: Nutritional: Goal: Maintenance of adequate nutrition will improve Outcome: Adequate for Discharge Goal: Progress toward achieving an optimal weight will improve Outcome: Adequate for Discharge   Problem: Skin Integrity: Goal: Risk for impaired skin integrity will decrease Outcome: Adequate for Discharge   Problem: Tissue Perfusion: Goal: Adequacy of tissue perfusion will improve Outcome: Adequate for Discharge   Problem: Education: Goal: Knowledge of the prescribed therapeutic regimen will improve Outcome: Adequate for Discharge   Problem: Activity: Goal: Ability to tolerate increased activity will improve Outcome: Adequate for Discharge   Problem: Health Behavior/Discharge Planning: Goal: Identification of resources available to assist in meeting health care needs will improve Outcome: Adequate for Discharge   Problem: Nutrition: Goal: Maintenance of adequate nutrition will improve Outcome: Adequate for Discharge   Problem: Clinical Measurements: Goal: Complications related to the disease process, condition or treatment will be avoided or minimized Outcome: Adequate for Discharge   Problem: Respiratory: Goal: Will regain and/or maintain adequate ventilation Outcome: Adequate for Discharge

## 2024-08-28 ENCOUNTER — Encounter (INDEPENDENT_AMBULATORY_CARE_PROVIDER_SITE_OTHER): Payer: Self-pay | Admitting: Otolaryngology

## 2024-08-28 ENCOUNTER — Ambulatory Visit (INDEPENDENT_AMBULATORY_CARE_PROVIDER_SITE_OTHER): Admitting: Otolaryngology

## 2024-08-28 ENCOUNTER — Ambulatory Visit (INDEPENDENT_AMBULATORY_CARE_PROVIDER_SITE_OTHER): Payer: Self-pay

## 2024-08-28 VITALS — BP 158/95 | HR 78

## 2024-08-28 DIAGNOSIS — E049 Nontoxic goiter, unspecified: Secondary | ICD-10-CM

## 2024-08-28 LAB — SURGICAL PATHOLOGY

## 2024-08-28 MED ORDER — AMOXICILLIN-POT CLAVULANATE 875-125 MG PO TABS: 1.0000 | ORAL_TABLET | Freq: Two times a day (BID) | ORAL | 0 refills | Status: AC

## 2024-08-28 NOTE — Progress Notes (Signed)
 ENT Progess Note   She is here for post-op check s/p total thyroidectomy. No issues with pain, no issues with voice and no numbness or tingling. No swelling along the neck. JP output was 180 ml/24 hrs  Physical Exam: Anterior neck incision c/d/I JP with 25 ml inside serosanguinous    A/P S/p total thyroidectomy for goiter   - will have her return on Monday for JP removal due to high output

## 2024-08-28 NOTE — Telephone Encounter (Signed)
 Spoke to patient regarding results.

## 2024-08-31 ENCOUNTER — Encounter: Payer: Self-pay | Admitting: Internal Medicine

## 2024-08-31 DIAGNOSIS — R739 Hyperglycemia, unspecified: Secondary | ICD-10-CM

## 2024-08-31 NOTE — Progress Notes (Unsigned)
 Dear Dr. Garald, Here is my assessment for our mutual patient, Sylvia Alvarado. Thank you for allowing me the opportunity to care for your patient. Please do not hesitate to contact me should you have any other questions. Sincerely, Dr. Hadassah Parody  Otolaryngology Clinic Note Referring provider: Dr. Garald HPI:    Sylvia Alvarado is a 53 y.o. female with a history of thyroid  goiter with compressive symptoms.   She was taken to the OR by Dr. Soldatova on 08/26/24 for total thyroidectomy. She presents today for post-op follow-up and drain removal.   Patient reports she has done well since surgery. No pain or complaints.   Independent Review of Additional Tests or Records:  Path report 08/26/24: no evidence of carcinoma  PMH/Meds/All/SocHx/FamHx/ROS:   Past Medical History:  Diagnosis Date   Abnormal Papanicolaou smear of cervix with positive human papilloma virus (HPV) test 12/17/2018   LSIL +HPV, will schedule colpo   Allergy    Arthritis    Asthma    Cervical high risk HPV (human papillomavirus) test positive 08/20/2017   Depression    situational   Diabetes mellitus type 2 in obese 06/15/2021   Fatty liver    GERD (gastroesophageal reflux disease)    Goiter    Hypertension    Low back pain    Obesity    OSA (obstructive sleep apnea)    on CPAP Dr Corrie   Vaginal Pap smear, abnormal      Past Surgical History:  Procedure Laterality Date   ARTHROSCOPY KNEE W/ DRILLING Left    BREAST REDUCTION SURGERY  1993   ENDOMETRIAL ABLATION     REDUCTION MAMMAPLASTY Bilateral    THYROIDECTOMY N/A 08/26/2024   Procedure: TOTAL THYROIDECTOMY;  Surgeon: Okey Burns, MD;  Location: [facility] OR;  Service: ENT;  Laterality: N/A;   TUBAL LIGATION      Family History  Problem Relation Age of Onset   Cancer Mother        Uterine   Stroke Mother    Heart disease Mother 98   Emphysema Father    Heart disease Father    Emphysema Sister    Endometriosis Sister    Breast  cancer Maternal Grandmother    Cancer Maternal Grandmother 51       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 Pt's mom has a 50% chance - not tested yet.    Fibroids Maternal Grandmother    Cancer Maternal Grandfather        Brain   Arthritis Other    Heart disease Other        A Fib     Social Connections: Unknown (05/26/2024)   Social Connection and Isolation Panel    Frequency of Communication with Friends and Family: Once a week    Frequency of Social Gatherings with Friends and Family: Patient declined    Attends Religious Services: More than 4 times per year    Active Member of [employer] or Organizations: Yes    Attends Engineer, structural: More than 4 times per year    Marital Status: Married     Current Outpatient Medications  Medication Instructions   acetaminophen  (TYLENOL ) 500 mg, Oral, Every 6 hours, Take every 6 hrs x 2-3 days then take every 6 hrs as needed   ADVAIR DISKUS 100-50 MCG/ACT AEPB INHALE 1 PUFF into THE lungs TWICE DAILY   albuterol  (VENTOLIN  HFA) 108 (90  Base) MCG/ACT inhaler 2 puffs, Inhalation, Every 6 hours PRN   amoxicillin-clavulanate (AUGMENTIN) 875-125 MG tablet 1 tablet, Oral, 2 times daily   amphetamine -dextroamphetamine  (ADDERALL XR) 10 MG 24 hr capsule 10 mg, Oral, Daily   amphetamine -dextroamphetamine  (ADDERALL XR) 10 MG 24 hr capsule 10 mg, Oral, Daily   amphetamine -dextroamphetamine  (ADDERALL XR) 10 MG 24 hr capsule 10 mg, Oral, Daily   azelastine  (ASTELIN ) 0.1 % nasal spray 2 sprays, Each Nare, 2 times daily, Use in each nostril as directed   B Complex-C (SUPER B COMPLEX PO) Take by mouth.   calcium carbonate (TUMS - DOSED IN MG ELEMENTAL CALCIUM) 500 MG chewable tablet 400 mg of elemental calcium, Oral, 3 times daily   clonazePAM  (KLONOPIN ) 0.5 mg, Oral, 2 times daily PRN   clonazePAM  (KLONOPIN ) 1 mg, 2 times daily PRN   cyclobenzaprine   (FLEXERIL ) 10 mg, Oral, 2 times daily   DULoxetine  (CYMBALTA ) 30 mg, Oral, Daily   fluticasone  (FLONASE ) 50 MCG/ACT nasal spray PLACE TWO SPRAYS INTO BOTH NOSTRILS DAILY   furosemide  (LASIX ) 20 mg, Oral, Daily PRN   ibuprofen (ADVIL) 600 mg, Oral, Every 6 hours, Please take every 6 hrs, and stagger this medication 3 hrs apart from Tylenol    levocetirizine (XYZAL  ALLERGY 24HR) 5 mg, Oral, Every evening   levothyroxine (SYNTHROID) 200 mcg, Oral, Daily   meloxicam  (MOBIC ) 15 MG tablet TAKE 1 TABLET BY MOUTH EVERY DAY   metFORMIN  (GLUCOPHAGE ) 500 mg, Oral, 2 times daily with meals   metoprolol  succinate (TOPROL -XL) 25 mg, Oral, Daily   oxyCODONE  (OXY IR/ROXICODONE ) 5 mg, Oral, Every 6 hours PRN   pantoprazole  (PROTONIX ) 40 mg, Oral, Daily   tirzepatide  (MOUNJARO ) 2.5 mg, Subcutaneous, Weekly   triamcinolone  cream (KENALOG ) 0.1 % APPLY TO THE AFFECTED AREA(S) FOUR TIMES DAILY   UNABLE TO FIND 2 capsules, Daily   Vitamin D3 2,000 Units, Oral, Daily     Physical Exam:   BP (!) 135/90   Pulse 97   LMP 03/17/2008 (Approximate)   SpO2 95%   Salient findings:  CN II-XII intact Neck incision c/d/I  JP drain with minimal ss output. This was removed No dysphonia    Seprately Identifiable Procedures:  Prior to initiating any procedures, risks/benefits/alternatives were explained to the patient and verbal consent obtained. None  Impression & Plans:  Sylvia Alvarado is a 53 y.o. female with   1. Thyroid  goiter    - S/p total thyroidectomy, no evidence of carcinoma on final pathology - drain removed today   - on synthroid - recommend follow-up with PCP for thyroid  labs and synthroid management    See below regarding exact medications prescribed this encounter including dosages and route: No orders of the defined types were placed in this encounter.   Thank you for allowing me the opportunity to care for your patient. Please do not hesitate to contact me should you have any other  questions.  Sincerely, Hadassah Parody, MD Otolaryngologist (ENT), [facility] Phone: [phone] Fax: [phone]

## 2024-09-01 ENCOUNTER — Ambulatory Visit (INDEPENDENT_AMBULATORY_CARE_PROVIDER_SITE_OTHER)

## 2024-09-01 ENCOUNTER — Encounter (INDEPENDENT_AMBULATORY_CARE_PROVIDER_SITE_OTHER): Payer: Self-pay

## 2024-09-01 ENCOUNTER — Encounter (INDEPENDENT_AMBULATORY_CARE_PROVIDER_SITE_OTHER): Admitting: Otolaryngology

## 2024-09-01 VITALS — BP 135/90 | HR 97

## 2024-09-01 DIAGNOSIS — E049 Nontoxic goiter, unspecified: Secondary | ICD-10-CM

## 2024-09-02 ENCOUNTER — Encounter: Payer: Self-pay | Admitting: Internal Medicine

## 2024-09-04 ENCOUNTER — Other Ambulatory Visit: Payer: Self-pay | Admitting: Internal Medicine

## 2024-09-04 DIAGNOSIS — E039 Hypothyroidism, unspecified: Secondary | ICD-10-CM | POA: Insufficient documentation

## 2024-09-07 ENCOUNTER — Other Ambulatory Visit: Payer: Self-pay | Admitting: Internal Medicine

## 2024-09-07 MED ORDER — TIRZEPATIDE 5 MG/0.5ML ~~LOC~~ SOAJ: 5.0000 mg | SUBCUTANEOUS | 3 refills | Status: DC

## 2024-09-08 ENCOUNTER — Other Ambulatory Visit: Payer: Self-pay

## 2024-09-08 MED ORDER — TIRZEPATIDE 5 MG/0.5ML ~~LOC~~ SOAJ: 5.0000 mg | SUBCUTANEOUS | 3 refills | Status: DC

## 2024-09-08 NOTE — Telephone Encounter (Signed)
 Copied from CRM [id number]. Topic: Clinical - Medication Question >> Sep 08, 2024  9:26 AM Winona SAUNDERS wrote: Lauraine Calling from [facility] needs diagnosis code for tirzepatide  (MOUNJARO ) 5 MG/0.5ML Pen. New rules pt needs a new rx with diagnosis code on it in order for insurance to pay for it

## 2024-09-12 ENCOUNTER — Encounter (INDEPENDENT_AMBULATORY_CARE_PROVIDER_SITE_OTHER)

## 2024-09-16 ENCOUNTER — Other Ambulatory Visit: Payer: Self-pay | Admitting: Internal Medicine

## 2024-09-24 ENCOUNTER — Other Ambulatory Visit: Payer: Self-pay | Admitting: Internal Medicine

## 2024-10-06 ENCOUNTER — Other Ambulatory Visit: Payer: Self-pay | Admitting: Internal Medicine

## 2024-10-13 ENCOUNTER — Telehealth: Payer: Self-pay

## 2024-10-13 ENCOUNTER — Other Ambulatory Visit (HOSPITAL_COMMUNITY): Payer: Self-pay

## 2024-10-13 NOTE — Telephone Encounter (Signed)
 Copied from CRM #[id number]. Topic: Clinical - Request for Lab/Test Order >> Oct 13, 2024  3:05 PM Sylvia Alvarado wrote: Reason for CRM: Patient spoke to clinic about having her lab orders faxed to [facility] in [place], and calling back to let the clinic know [facility] still doesn't have them , patient needs it faxed to [facility] in [place].

## 2024-10-14 ENCOUNTER — Other Ambulatory Visit (INDEPENDENT_AMBULATORY_CARE_PROVIDER_SITE_OTHER): Payer: Self-pay | Admitting: Otolaryngology

## 2024-10-14 ENCOUNTER — Other Ambulatory Visit: Payer: Self-pay

## 2024-10-14 DIAGNOSIS — E039 Hypothyroidism, unspecified: Secondary | ICD-10-CM

## 2024-10-14 DIAGNOSIS — R739 Hyperglycemia, unspecified: Secondary | ICD-10-CM

## 2024-10-20 ENCOUNTER — Ambulatory Visit: Payer: Self-pay | Admitting: Internal Medicine

## 2024-10-20 ENCOUNTER — Ambulatory Visit: Admitting: Internal Medicine

## 2024-10-20 ENCOUNTER — Encounter: Payer: Self-pay | Admitting: Internal Medicine

## 2024-10-20 VITALS — BP 134/86 | HR 119 | Temp 97.8°F | Ht 66.0 in | Wt 302.2 lb

## 2024-10-20 DIAGNOSIS — F900 Attention-deficit hyperactivity disorder, predominantly inattentive type: Secondary | ICD-10-CM

## 2024-10-20 DIAGNOSIS — I10 Essential (primary) hypertension: Secondary | ICD-10-CM

## 2024-10-20 DIAGNOSIS — E039 Hypothyroidism, unspecified: Secondary | ICD-10-CM

## 2024-10-20 DIAGNOSIS — R5382 Chronic fatigue, unspecified: Secondary | ICD-10-CM

## 2024-10-20 DIAGNOSIS — E669 Obesity, unspecified: Secondary | ICD-10-CM

## 2024-10-20 LAB — CBC WITH DIFFERENTIAL/PLATELET
Basophils Absolute: 0.1 K/uL (ref 0.0–0.1)
Basophils Relative: 0.9 % (ref 0.0–3.0)
Eosinophils Absolute: 0.2 K/uL (ref 0.0–0.7)
Eosinophils Relative: 2.5 % (ref 0.0–5.0)
HCT: 43.3 % (ref 36.0–46.0)
Hemoglobin: 14.6 g/dL (ref 12.0–15.0)
Lymphocytes Relative: 38.1 % (ref 12.0–46.0)
Lymphs Abs: 2.5 K/uL (ref 0.7–4.0)
MCHC: 33.9 g/dL (ref 30.0–36.0)
MCV: 85.5 fl (ref 78.0–100.0)
Monocytes Absolute: 0.5 K/uL (ref 0.1–1.0)
Monocytes Relative: 7 % (ref 3.0–12.0)
Neutro Abs: 3.3 K/uL (ref 1.4–7.7)
Neutrophils Relative %: 51.5 % (ref 43.0–77.0)
Platelets: 237 K/uL (ref 150.0–400.0)
RBC: 5.06 Mil/uL (ref 3.87–5.11)
RDW: 16.1 % — ABNORMAL HIGH (ref 11.5–15.5)
WBC: 6.5 K/uL (ref 4.0–10.5)

## 2024-10-20 LAB — COMPREHENSIVE METABOLIC PANEL WITH GFR
ALT: 43 U/L — ABNORMAL HIGH (ref 0–35)
AST: 62 U/L — ABNORMAL HIGH (ref 0–37)
Albumin: 4.3 g/dL (ref 3.5–5.2)
Alkaline Phosphatase: 59 U/L (ref 39–117)
BUN: 15 mg/dL (ref 6–23)
CO2: 29 meq/L (ref 19–32)
Calcium: 9.9 mg/dL (ref 8.4–10.5)
Chloride: 101 meq/L (ref 96–112)
Creatinine, Ser: 0.75 mg/dL (ref 0.40–1.20)
GFR: 92.31 mL/min (ref >60.00)
Glucose, Bld: 104 mg/dL — ABNORMAL HIGH (ref 70–99)
Potassium: 3.9 meq/L (ref 3.5–5.1)
Sodium: 141 meq/L (ref 135–145)
Total Bilirubin: 0.5 mg/dL (ref 0.2–1.2)
Total Protein: 7.6 g/dL (ref 6.0–8.3)

## 2024-10-20 LAB — HEMOGLOBIN A1C: Hgb A1c MFr Bld: 6.3 % (ref 4.6–6.5)

## 2024-10-20 LAB — TSH: TSH: 0.25 u[IU]/mL — ABNORMAL LOW (ref 0.35–5.50)

## 2024-10-20 LAB — T4, FREE: Free T4: 1.09 ng/dL (ref 0.60–1.60)

## 2024-10-20 MED ORDER — TIRZEPATIDE 7.5 MG/0.5ML ~~LOC~~ SOAJ: 7.5000 mg | SUBCUTANEOUS | 5 refills | Status: AC

## 2024-10-20 MED ORDER — AMPHETAMINE-DEXTROAMPHET ER 10 MG PO CP24: 10.0000 mg | ORAL_CAPSULE | Freq: Every day | ORAL | 0 refills | Status: AC

## 2024-10-20 NOTE — Assessment & Plan Note (Signed)
On Adderall XR 10 mg/d  Potential benefits of a long term amphetamines  use as well as potential risks  and complications were explained to the patient and were aknowledged.

## 2024-10-20 NOTE — Assessment & Plan Note (Signed)
 Status post thyroidectomy in October 2025 - Dr Okey.  No malignancy On levothyroxine  200 mcg daily, calcium  Labs

## 2024-10-20 NOTE — Addendum Note (Signed)
 Addended by: Michaeleen Down V on: 10/20/2024 01:13 PM   Modules accepted: Orders

## 2024-10-20 NOTE — Assessment & Plan Note (Addendum)
 Increase Mounjaro  to 7.5 mg/d

## 2024-10-20 NOTE — Assessment & Plan Note (Signed)
 Better

## 2024-10-20 NOTE — Progress Notes (Signed)
 Subjective:  Patient ID: Sylvia Alvarado, female    DOB: 12/29/1970  Age: 53 y.o. MRN: [id number]  CC: Medical Management of Chronic Issues (Med follow up)   HPI Sylvia Alvarado presents for s/p thyroid  surgery, ADD, HTN, DM  Outpatient Medications Prior to Visit  Medication Sig Dispense Refill   acetaminophen  (TYLENOL ) 500 MG tablet Take 1 tablet (500 mg total) by mouth every 6 (six) hours. Take every 6 hrs x 2-3 days then take every 6 hrs as needed 30 tablet 0   ADVAIR DISKUS 100-50 MCG/ACT AEPB INHALE 1 PUFF into THE lungs TWICE DAILY 60 each 11   albuterol  (VENTOLIN  HFA) 108 (90 Base) MCG/ACT inhaler Inhale 2 puffs into the lungs every 6 (six) hours as needed for wheezing or shortness of breath. 18 g 11   amoxicillin -clavulanate (AUGMENTIN ) 875-125 MG tablet Take 1 tablet by mouth 2 (two) times daily. 20 tablet 0   amphetamine -dextroamphetamine  (ADDERALL XR) 10 MG 24 hr capsule Take 1 capsule (10 mg total) by mouth daily. 30 capsule 0   amphetamine -dextroamphetamine  (ADDERALL XR) 10 MG 24 hr capsule Take 1 capsule (10 mg total) by mouth daily. 30 capsule 0   azelastine  (ASTELIN ) 0.1 % nasal spray Place 2 sprays into both nostrils 2 (two) times daily. Use in each nostril as directed (Patient taking differently: Place 2 sprays into both nostrils 2 (two) times daily as needed for rhinitis or allergies. Use in each nostril as directed) 30 mL 12   B Complex-C (SUPER B COMPLEX PO) Take by mouth.     calcium  carbonate (TUMS - DOSED IN MG ELEMENTAL CALCIUM ) 500 MG chewable tablet Chew 2 tablets (400 mg of elemental calcium  total) by mouth 3 (three) times daily. 90 tablet 1   Cholecalciferol  (VITAMIN D3) 50 MCG (2000 UT) capsule Take 1 capsule (2,000 Units total) by mouth daily. 100 capsule 3   clonazePAM  (KLONOPIN ) 0.5 MG tablet Take 1 tablet (0.5 mg total) by mouth 2 (two) times daily as needed for anxiety. 60 tablet 1   clonazePAM  (KLONOPIN ) 1 MG tablet TAKE 1 TABLET BY MOUTH TWICE DAILY AS  NEEDED 60 tablet 1   cyclobenzaprine  (FLEXERIL ) 10 MG tablet TAKE 1 TABLET BY MOUTH TWICE DAILY (Patient taking differently: Take 10 mg by mouth 2 (two) times daily as needed for muscle spasms.) 60 tablet 3   DULoxetine  (CYMBALTA ) 30 MG capsule Take 1 capsule (30 mg total) by mouth daily. 90 capsule 1   fluticasone  (FLONASE ) 50 MCG/ACT nasal spray PLACE TWO SPRAYS INTO BOTH NOSTRILS DAILY (Patient taking differently: Place 2 sprays into both nostrils daily as needed for allergies.) 16 g 6   furosemide  (LASIX ) 20 MG tablet Take 1 tablet (20 mg total) by mouth daily as needed. 30 tablet 3   ibuprofen  (ADVIL ) 600 MG tablet Take 1 tablet (600 mg total) by mouth every 6 (six) hours. Please take every 6 hrs, and stagger this medication 3 hrs apart from Tylenol  30 tablet 0   levocetirizine (XYZAL  ALLERGY 24HR) 5 MG tablet Take 1 tablet (5 mg total) by mouth every evening for 14 days. 14 tablet 0   levothyroxine  (SYNTHROID ) 200 MCG tablet Take 1 tablet (200 mcg total) by mouth daily at 6 (six) AM. 30 tablet 1   meloxicam  (MOBIC ) 15 MG tablet TAKE 1 TABLET BY MOUTH EVERY DAY 30 tablet 2   metFORMIN  (GLUCOPHAGE ) 500 MG tablet Take 1 tablet (500 mg total) by mouth 2 (two) times daily with a meal. 180 tablet  3   metoprolol  succinate (TOPROL -XL) 25 MG 24 hr tablet TAKE 1 TABLET BY MOUTH EVERY DAY 90 tablet 2   oxyCODONE  (OXY IR/ROXICODONE ) 5 MG immediate release tablet Take 1 tablet (5 mg total) by mouth every 6 (six) hours as needed for breakthrough pain or severe pain (pain score 7-10). 25 tablet 0   pantoprazole  (PROTONIX ) 40 MG tablet TAKE 1 TABLET BY MOUTH DAILY 90 tablet 3   triamcinolone  cream (KENALOG ) 0.1 % APPLY TO THE AFFECTED AREA(S) FOUR TIMES DAILY (Patient taking differently: Apply 1 Application topically 4 (four) times daily as needed (irritation).) 80 g 0   UNABLE TO FIND Take 2 capsules by mouth daily. [place]     amphetamine -dextroamphetamine  (ADDERALL XR) 10 MG 24 hr capsule TAKE ONE  CAPSULE BY MOUTH DAILY 30 capsule 0   tirzepatide  (MOUNJARO ) 5 MG/0.5ML Pen Inject 5 mg into the skin once a week. 2 mL 3   No facility-administered medications prior to visit.    ROS: Review of Systems  Constitutional:  Negative for activity change, appetite change, chills, fatigue and unexpected weight change.  HENT:  Negative for congestion, mouth sores and sinus pressure.   Eyes:  Negative for visual disturbance.  Respiratory:  Negative for cough and chest tightness.   Gastrointestinal:  Negative for abdominal pain and nausea.  Genitourinary:  Negative for difficulty urinating, frequency and vaginal pain.  Musculoskeletal:  Negative for arthralgias, back pain and gait problem.  Skin:  Negative for pallor and rash.  Neurological:  Negative for dizziness, tremors, weakness, numbness and headaches.  Psychiatric/Behavioral:  Negative for confusion, sleep disturbance and suicidal ideas. The patient is not nervous/anxious.     Objective:  BP 134/86   Pulse (!) 119   Temp 97.8 F (36.6 C)   Ht 5' 6 (1.676 m)   Wt (!) 302 lb 3.2 oz (137.1 kg)   SpO2 99%   BMI 48.78 kg/m   BP Readings from Last 3 Encounters:  10/20/24 134/86  09/01/24 (!) 135/90  08/28/24 (!) 158/95    Wt Readings from Last 3 Encounters:  10/20/24 (!) 302 lb 3.2 oz (137.1 kg)  08/26/24 (!) 305 lb (138.3 kg)  08/20/24 (!) 307 lb (139.3 kg)    Physical Exam Constitutional:      General: She is not in acute distress.    Appearance: She is well-developed. She is obese.  HENT:     Head: Normocephalic.     Right Ear: External ear normal.     Left Ear: External ear normal.     Nose: Nose normal.  Eyes:     General:        Right eye: No discharge.        Left eye: No discharge.     Conjunctiva/sclera: Conjunctivae normal.     Pupils: Pupils are equal, round, and reactive to light.  Neck:     Thyroid : No thyromegaly.     Vascular: No JVD.     Trachea: No tracheal deviation.  Cardiovascular:     Rate  and Rhythm: Normal rate and regular rhythm.     Heart sounds: Normal heart sounds.  Pulmonary:     Effort: No respiratory distress.     Breath sounds: No stridor. No wheezing.  Abdominal:     General: Bowel sounds are normal. There is no distension.     Palpations: Abdomen is soft. There is no mass.     Tenderness: There is no abdominal tenderness. There is no guarding  or rebound.  Musculoskeletal:        General: No tenderness.     Cervical back: Normal range of motion and neck supple. No rigidity.  Lymphadenopathy:     Cervical: No cervical adenopathy.  Skin:    Findings: No erythema or rash.  Neurological:     Cranial Nerves: No cranial nerve deficit.     Motor: No abnormal muscle tone.     Coordination: Coordination normal.     Deep Tendon Reflexes: Reflexes normal.  Psychiatric:        Behavior: Behavior normal.        Thought Content: Thought content normal.        Judgment: Judgment normal.   Neck scar is healing  Lab Results  Component Value Date   WBC 6.8 08/26/2024   HGB 13.2 08/26/2024   HCT 40.6 08/26/2024   PLT 244 08/26/2024   GLUCOSE 141 (H) 07/29/2024   CHOL 193 03/27/2023   TRIG 259.0 (H) 03/27/2023   HDL 52.90 03/27/2023   LDLDIRECT 114.0 03/27/2023   LDLCALC 94 03/17/2022   ALT 40 (H) 07/29/2024   AST 60 (H) 07/29/2024   NA 140 07/29/2024   K 4.2 07/29/2024   CL 102 07/29/2024   CREATININE 0.85 08/26/2024   BUN 13 07/29/2024   CO2 28 07/29/2024   TSH 0.61 07/29/2024   HGBA1C 8.1 (H) 07/29/2024    No results found.  Assessment & Plan:   Problem List Items Addressed This Visit     ADD (attention deficit disorder) - Primary   On Adderall XR 10 mg/d  Potential benefits of a long term amphetamines  use as well as potential risks  and complications were explained to the patient and were aknowledged.      Chronic fatigue   Better      Essential hypertension   Better      Hypothyroidism (acquired)   Status post thyroidectomy in  October 2025 - Dr Soldatova.  No malignancy On levothyroxine  200 mcg daily, calcium  Labs      Relevant Orders   Comprehensive metabolic panel with GFR   T4, free   TSH   Hemoglobin A1c   Type 2 diabetes mellitus in patient with obesity (HCC)   Increase Mounjaro  to 7.5 mg/d      Relevant Medications   tirzepatide  (MOUNJARO ) 7.5 MG/0.5ML Pen   Other Relevant Orders   Comprehensive metabolic panel with GFR   T4, free   TSH   Hemoglobin A1c      Meds ordered this encounter  Medications   tirzepatide  (MOUNJARO ) 7.5 MG/0.5ML Pen    Sig: Inject 7.5 mg into the skin once a week.    Dispense:  2 mL    Refill:  5   amphetamine -dextroamphetamine  (ADDERALL XR) 10 MG 24 hr capsule    Sig: Take 1 capsule (10 mg total) by mouth daily.    Dispense:  30 capsule    Refill:  0      Follow-up: Return in about 2 months (around 12/21/2024) for a follow-up visit.  Marolyn Noel, MD

## 2024-11-18 ENCOUNTER — Other Ambulatory Visit (INDEPENDENT_AMBULATORY_CARE_PROVIDER_SITE_OTHER): Payer: Self-pay | Admitting: Otolaryngology

## 2024-11-18 ENCOUNTER — Encounter: Payer: Self-pay | Admitting: Internal Medicine

## 2024-11-19 ENCOUNTER — Telehealth: Payer: Self-pay

## 2024-11-19 ENCOUNTER — Other Ambulatory Visit (HOSPITAL_COMMUNITY): Payer: Self-pay

## 2024-11-19 ENCOUNTER — Other Ambulatory Visit: Payer: Self-pay

## 2024-11-19 MED ORDER — LEVOTHYROXINE SODIUM 200 MCG PO TABS: 200.0000 ug | ORAL_TABLET | Freq: Every day | ORAL | 3 refills | Status: AC

## 2024-11-19 NOTE — Telephone Encounter (Signed)
 Pharmacy Patient Advocate Encounter   Received notification from Patient Advice Request messages that prior authorization for Mounjaro  7.5mg /0.74ml is required/requested.   Insurance verification completed.   The patient is insured through [facility].   Per test claim: PA required; PA started via CoverMyMeds. KEY [id number] . Waiting for clinical questions to populate.

## 2024-11-19 NOTE — Telephone Encounter (Signed)
 Clinical questions answered and PA submitted.

## 2024-11-19 NOTE — Telephone Encounter (Signed)
 I was able to send that refill in to the pts pharmacy as requested with the proper refills added as well.

## 2024-11-19 NOTE — Telephone Encounter (Signed)
 Copied from CRM [id number]. Topic: Clinical - Medication Question >> Nov 19, 2024  9:07 AM Carlyon D wrote: Reason for CRM:  Pt is calling in regards to her script levothyroxine  (SYNTHROID ) 200 MCG tablet  Pt states this was ordered by another Dr. Sharman she is no longer going to see and Dr. Garald was going to be the one to start doing her refills. Pt states she had to have an emergency refill because he did not send a script over for it . Pt is asking this this script be refilled and sent over to her pharmacy as dr potnikov told her he would do so. Please reach out to pt for further info

## 2024-11-25 NOTE — Telephone Encounter (Signed)
 Pharmacy Patient Advocate Encounter  Received notification from [facility] that Prior Authorization for Mounjaro  7.5mg /0.63ml has been APPROVED from 11/24/24 to 11/24/25   PA #/Case ID/Reference #: [id number]

## 2024-12-08 ENCOUNTER — Other Ambulatory Visit: Payer: Self-pay | Admitting: Internal Medicine

## 2024-12-08 ENCOUNTER — Inpatient Hospital Stay (HOSPITAL_COMMUNITY): Admission: RE | Admit: 2024-12-08 | Source: Ambulatory Visit

## 2024-12-11 ENCOUNTER — Telehealth: Payer: Self-pay

## 2024-12-11 NOTE — Telephone Encounter (Signed)
 Copied from CRM #[id number]. Topic: Clinical - Medication Question >> Dec 11, 2024 10:49 AM Suzen RAMAN wrote: Reason for CRM: Myra from [facility] would like a call back pertaining to dx code needed for  medication refill for tirzepatide  (MOUNJARO ) 7.5 MG/0.5ML Pen.    CB#[phone]

## 2024-12-11 NOTE — Telephone Encounter (Signed)
 Spoke with pharmacy team and was able to provide the correct Dx code for medication.

## 2024-12-15 ENCOUNTER — Other Ambulatory Visit: Payer: Self-pay | Admitting: Internal Medicine

## 2024-12-17 ENCOUNTER — Encounter: Payer: Self-pay | Admitting: Internal Medicine

## 2025-01-02 ENCOUNTER — Ambulatory Visit (HOSPITAL_COMMUNITY)
